# Patient Record
Sex: Female | Born: 1950 | Race: White | Hispanic: No | Marital: Single | Smoking: Current every day smoker
Health system: Southern US, Community
[De-identification: ages and names within clinical notes are randomized; demographics above are authoritative.]

## PROBLEM LIST (undated history)

## (undated) DIAGNOSIS — R053 Chronic cough: Secondary | ICD-10-CM

## (undated) DIAGNOSIS — E0590 Thyrotoxicosis, unspecified without thyrotoxic crisis or storm: Secondary | ICD-10-CM

## (undated) DIAGNOSIS — R05 Cough: Secondary | ICD-10-CM

## (undated) DIAGNOSIS — I5189 Other ill-defined heart diseases: Secondary | ICD-10-CM

## (undated) DIAGNOSIS — J449 Chronic obstructive pulmonary disease, unspecified: Secondary | ICD-10-CM

## (undated) DIAGNOSIS — I700 Atherosclerosis of aorta: Secondary | ICD-10-CM

## (undated) DIAGNOSIS — R9431 Abnormal electrocardiogram [ECG] [EKG]: Secondary | ICD-10-CM

## (undated) DIAGNOSIS — M5136 Other intervertebral disc degeneration, lumbar region: Secondary | ICD-10-CM

## (undated) DIAGNOSIS — I739 Peripheral vascular disease, unspecified: Secondary | ICD-10-CM

## (undated) DIAGNOSIS — I70213 Atherosclerosis of native arteries of extremities with intermittent claudication, bilateral legs: Secondary | ICD-10-CM

## (undated) DIAGNOSIS — M51369 Other intervertebral disc degeneration, lumbar region without mention of lumbar back pain or lower extremity pain: Secondary | ICD-10-CM

## (undated) HISTORY — PX: OTHER SURGICAL HISTORY: SHX169

## (undated) HISTORY — DX: Thyrotoxicosis, unspecified without thyrotoxic crisis or storm: E05.90

---

## 2011-05-29 HISTORY — PX: BACK SURGERY: SHX140

## 2011-06-18 ENCOUNTER — Other Ambulatory Visit (HOSPITAL_COMMUNITY): Payer: Commercial Indemnity

## 2011-06-19 ENCOUNTER — Other Ambulatory Visit: Payer: Self-pay | Admitting: Specialist

## 2011-06-19 ENCOUNTER — Other Ambulatory Visit (HOSPITAL_COMMUNITY): Payer: Self-pay | Admitting: Specialist

## 2011-06-19 ENCOUNTER — Ambulatory Visit (HOSPITAL_COMMUNITY)
Admission: RE | Admit: 2011-06-19 | Discharge: 2011-06-19 | Disposition: A | Discharge status: Home / Self Care | Payer: Commercial Indemnity | Source: Ambulatory Visit | Attending: Specialist | Admitting: Specialist

## 2011-06-19 ENCOUNTER — Encounter (HOSPITAL_COMMUNITY): Payer: Commercial Indemnity

## 2011-06-19 DIAGNOSIS — Z01818 Encounter for other preprocedural examination: Secondary | ICD-10-CM

## 2011-06-19 DIAGNOSIS — I700 Atherosclerosis of aorta: Secondary | ICD-10-CM | POA: Insufficient documentation

## 2011-06-19 DIAGNOSIS — Z01812 Encounter for preprocedural laboratory examination: Secondary | ICD-10-CM | POA: Insufficient documentation

## 2011-06-19 DIAGNOSIS — R9431 Abnormal electrocardiogram [ECG] [EKG]: Secondary | ICD-10-CM | POA: Insufficient documentation

## 2011-06-19 DIAGNOSIS — Z01810 Encounter for preprocedural cardiovascular examination: Secondary | ICD-10-CM | POA: Insufficient documentation

## 2011-06-19 DIAGNOSIS — Z01811 Encounter for preprocedural respiratory examination: Secondary | ICD-10-CM | POA: Insufficient documentation

## 2011-06-19 DIAGNOSIS — IMO0002 Reserved for concepts with insufficient information to code with codable children: Secondary | ICD-10-CM | POA: Insufficient documentation

## 2011-06-19 LAB — CBC
HCT: 47.9 % — ABNORMAL HIGH (ref 36.0–46.0)
Hemoglobin: 16.7 g/dL — ABNORMAL HIGH (ref 12.0–15.0)
MCH: 32.1 pg (ref 26.0–34.0)
MCV: 92.1 fL (ref 78.0–100.0)
RBC: 5.2 MIL/uL — ABNORMAL HIGH (ref 3.87–5.11)

## 2011-06-19 LAB — COMPREHENSIVE METABOLIC PANEL
ALT: 12 U/L (ref 0–35)
AST: 17 U/L (ref 0–37)
Albumin: 3.9 g/dL (ref 3.5–5.2)
CO2: 30 mEq/L (ref 19–32)
Calcium: 10.2 mg/dL (ref 8.4–10.5)
GFR calc non Af Amer: >60 mL/min (ref >60)
Sodium: 137 mEq/L (ref 135–145)
Total Protein: 7.7 g/dL (ref 6.0–8.3)

## 2011-06-19 LAB — URINE MICROSCOPIC-ADD ON

## 2011-06-19 LAB — PROTIME-INR: Prothrombin Time: 12.8 seconds (ref 11.6–15.2)

## 2011-06-19 LAB — URINALYSIS, ROUTINE W REFLEX MICROSCOPIC
Glucose, UA: NEGATIVE mg/dL
Protein, ur: NEGATIVE mg/dL
Specific Gravity, Urine: 1.009 (ref 1.005–1.030)
pH: 6 (ref 5.0–8.0)

## 2011-06-26 ENCOUNTER — Ambulatory Visit (HOSPITAL_COMMUNITY): Payer: Managed Care, Other (non HMO)

## 2011-06-26 ENCOUNTER — Observation Stay (HOSPITAL_COMMUNITY)
Admission: RE | Admit: 2011-06-26 | Discharge: 2011-06-27 | Disposition: A | Discharge status: Home / Self Care | Payer: Managed Care, Other (non HMO) | Source: Ambulatory Visit | Attending: Specialist | Admitting: Specialist

## 2011-06-26 ENCOUNTER — Other Ambulatory Visit: Payer: Self-pay | Admitting: Specialist

## 2011-06-26 DIAGNOSIS — F17200 Nicotine dependence, unspecified, uncomplicated: Secondary | ICD-10-CM | POA: Insufficient documentation

## 2011-06-26 DIAGNOSIS — M5126 Other intervertebral disc displacement, lumbar region: Secondary | ICD-10-CM | POA: Insufficient documentation

## 2011-06-26 DIAGNOSIS — M5137 Other intervertebral disc degeneration, lumbosacral region: Secondary | ICD-10-CM | POA: Insufficient documentation

## 2011-06-26 DIAGNOSIS — M51379 Other intervertebral disc degeneration, lumbosacral region without mention of lumbar back pain or lower extremity pain: Secondary | ICD-10-CM | POA: Insufficient documentation

## 2011-06-26 DIAGNOSIS — M48061 Spinal stenosis, lumbar region without neurogenic claudication: Principal | ICD-10-CM | POA: Insufficient documentation

## 2011-06-26 LAB — URINE MICROSCOPIC-ADD ON

## 2011-06-26 LAB — URINALYSIS, ROUTINE W REFLEX MICROSCOPIC
Glucose, UA: NEGATIVE mg/dL
Specific Gravity, Urine: 1.011 (ref 1.005–1.030)
pH: 5.5 (ref 5.0–8.0)

## 2011-06-27 LAB — URINE CULTURE: Culture  Setup Time: 201208290912

## 2011-06-28 NOTE — Op Note (Signed)
NAMEMARLISE, Pamela [name]:  [id number]  MEDICAL RECORD NO.:  [id number]  LOCATION:  1524                         FACILITY:  [facility]  PHYSICIAN:  Jene Every, M.D.    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  POSTOPERATIVE DIAGNOSIS:  Spinal stenosis bilaterally, herniated nucleus pulposus at L4-L5 right.  PROCEDURE PERFORMED:  Bilateral hemilaminotomy and lateral recess decompression at L4-L5, foraminotomies at L4 and L5 with central removal of the  ligamentum flavum, and microdiskectomy L4-L5 right.  ANESTHESIA:  General.  ASSISTANT:  Roma Schanz, PA  INDICATIONS FOR PROCEDURE:  This is a 60 year old with bilateral lower extremity radicular pain secondary to lateral recess stenosis, disk degeneration at L4-L5 bilaterally, disk herniation L4-L5 to the right into the foraminal region.  She was indicated for decompression due to the persistent pain, weakness, positive neurotension signs, MRI indicating lateral recess stenosis, fairly severe, left and right.  She had been refractory to conservative treatment including therapy, injections, activity modification.  Risks and benefits discussed, including bleeding, infection, damage to neurovascular structures, CSF leakage, __________ fibrosis, adjacent segment disease, need for fusion in the future, DVT, PE, and anesthetic complications, etc.  TECHNIQUE:  The patient in supine position, after induction of adequate anesthesia and 1 g of Kefzol, she was placed prone on the [place] frame. All bony prominences were well-padded.  Lumbar region was prepped and draped in the usual sterile fashion.  An 18 gauge spinal needle was utilized to localize L4-L5 interspace, confirmed with x-ray.  Incision was made from spinous process of  L4 to L5.  Subcutaneous tissue was dissected.  Electrocautery utilized to achieve hemostasis.  Dorsolumbar fascia identified by the line of the skin incision.  Paraspinous muscle elevated from the lamina of L4 and L5.  Cobb retractor was placed. Operating microscope draped and brought on to the surgical field.  We confirmed the space with Penfield 4 and a Kocher on the spinous process of L4.  She had a very limited interlaminar window bilaterally.  We felt that with the foraminal disk herniation extending out into the right, we gain access to the lateral recess.  It would be prudent to preserve as much of the facet as possible to perform this bilaterally with a central removal of ligamentum and the interspinous ligament.  I removed a very small portion of the spinous process of L4 and L5 and interspinous ligament.  We used the operating microscope, draped and brought on to the surgical field.  First, we utilized the attached ligamentum flavum from the cephalad edge of L5 utilizing straight curette and caudad edge of L4 bilaterally using the 2 mm Kerrison, followed by 3 mm Kerrison. We preserved the pars amply bilaterally.  Centrally we then entered the canal.  We placed neural patties just beneath the ligamentum flavum and used a hockey stick probe to free adhesions noted cephalad and caudad, and medial and lateral.  We then meticulously removed the ligamentum flavum bilaterally.  There was hypertrophic ligamentum, severe left and right, central  and to the left.  Decompressed the lateral recesses to the medial border of the pedicle.  Severe stenosis noted bilaterally, right greater than left.  We identified the foramen of L4, a disk herniation.  After we checked the thecal sac and identified the L5 root, performed the foraminotomy of L5.  We identified the disk herniation migrating cephalad into the foramen of L5 and out laterally.  Bipolar cautery was utilized to achieve hemostasis in the  epidural venous plexus.  A small annulotomy, after confirming the foramen of L4, was packed with a neural hook and removed disk herniation, sub ligamentous, with a micro pituitary, further mobilizing with a nerve hook out laterally and __________, and a hockey stick.  Following this, a hockey stick probe passed freely up the foramen of L4 and L5.  We have released 1 cm excursion of L5 root medial pedicle without difficulty.  I removed the neural patties. Hockey stick probe passed freely up to foramen of L5.  There was initially an attenuated area of the thecal sac that was dorsal into the left.  No evidence of CSF leakage.  We will it prudent to place a small section of Durafoam over the area and reconstituted with saline, smooth side down.  Following that performed the Valsalva, no evidence CSF leakage.  We felt we addressed the pathology and satisfactorily decompressed the L4 and L5 bilaterally.  Cobb retractor was removed.  Paraspinous muscle was inspected, with no evidence of active bleeding.  Copiously irrigated the subcutaneous tissue and repaired the dorsolumbar fascia with #1 Vicryl interrupted figure-of-eight sutures, subcu with only 2-0 Vicryl simple sutures. Skin was reapproximated with 4-0 subcuticular Prolene.  Wound was reinforced with Steri-Strips, sterile dressings applied.  She was placed supine on hospital bed, extubated without difficulty, and  transported to the recovery room in satisfactory condition.  The patient tolerated the procedure well.  No complications.  Minimal blood loss.     Jene Every, M.D.     Cordelia Pen  D:  06/26/2011  T:  06/26/2011  Job:  [id number]  Electronically Signed by Jene Every M.D. on 06/28/2011 02:41:55 PM

## 2011-07-19 NOTE — Discharge Summary (Signed)
  NAMECARLOTA, [name]:  [id number]  MEDICAL RECORD NO.:  [id number]  LOCATION:  1524                         FACILITY:  [facility]  PHYSICIAN:  Jene Every, M.D.    DATE OF BIRTH:  10-21-1951  DATE OF ADMISSION:  06/26/2011 DATE OF DISCHARGE:  06/27/2011                              DISCHARGE SUMMARY   ADMISSION DIAGNOSES:  Spinal stenosis, herniated nucleus pulposus, L4- L5.  DISCHARGE DIAGNOSIS:  Spinal stenosis, herniated nucleus pulposus, L4- L5.  Status post bilateral hemilaminotomy and lateral recess decompression L4-L5 with microdiskectomy at L4-L5 on the right.  PROCEDURE:  The patient was taken to the OR and underwent the above stated procedure.  SURGEON:  Jene Every, M.D.  ASSISTANT:  Roma Schanz, P.A.  CONSULT:  PT/OT.  HOSPITAL COURSE:  Uneventful.  On postop day #1, it was felt patient was stable to be discharged to home with any home health needs met.  She is to follow up with Dr. Shelle Iron in approximately 10 to 14 days for suture removal.  ACTIVITY:  She is to ambulate as tolerated, utilizing back precautions.  MEDICATIONS:  As per med rec sheet.  DIET:  As tolerated.  CONDITION ON DISCHARGE:  Stable.  FINAL DIAGNOSIS:  Doing well, status post lumbar decompression, microdiskectomy at L4-L5.     Roma Schanz, P.A.   ______________________________ Jene Every, M.D.   CS/MEDQ  D:  07/12/2011  T:  07/12/2011  Job:  [id number]  Electronically Signed by Roma Schanz P.A. on 07/16/2011 12:04:14 PM Electronically Signed by Jene Every M.D. on 07/19/2011 03:10:37 PM

## 2012-04-15 IMAGING — CR DG CHEST 2V
2 series · 2 of 2 positions shown · non-contrast
Comparison: None

CLINICAL DATA: Preop radiograph.  Herniated disc disease.

CHEST - 2 VIEW

[w chest pa]
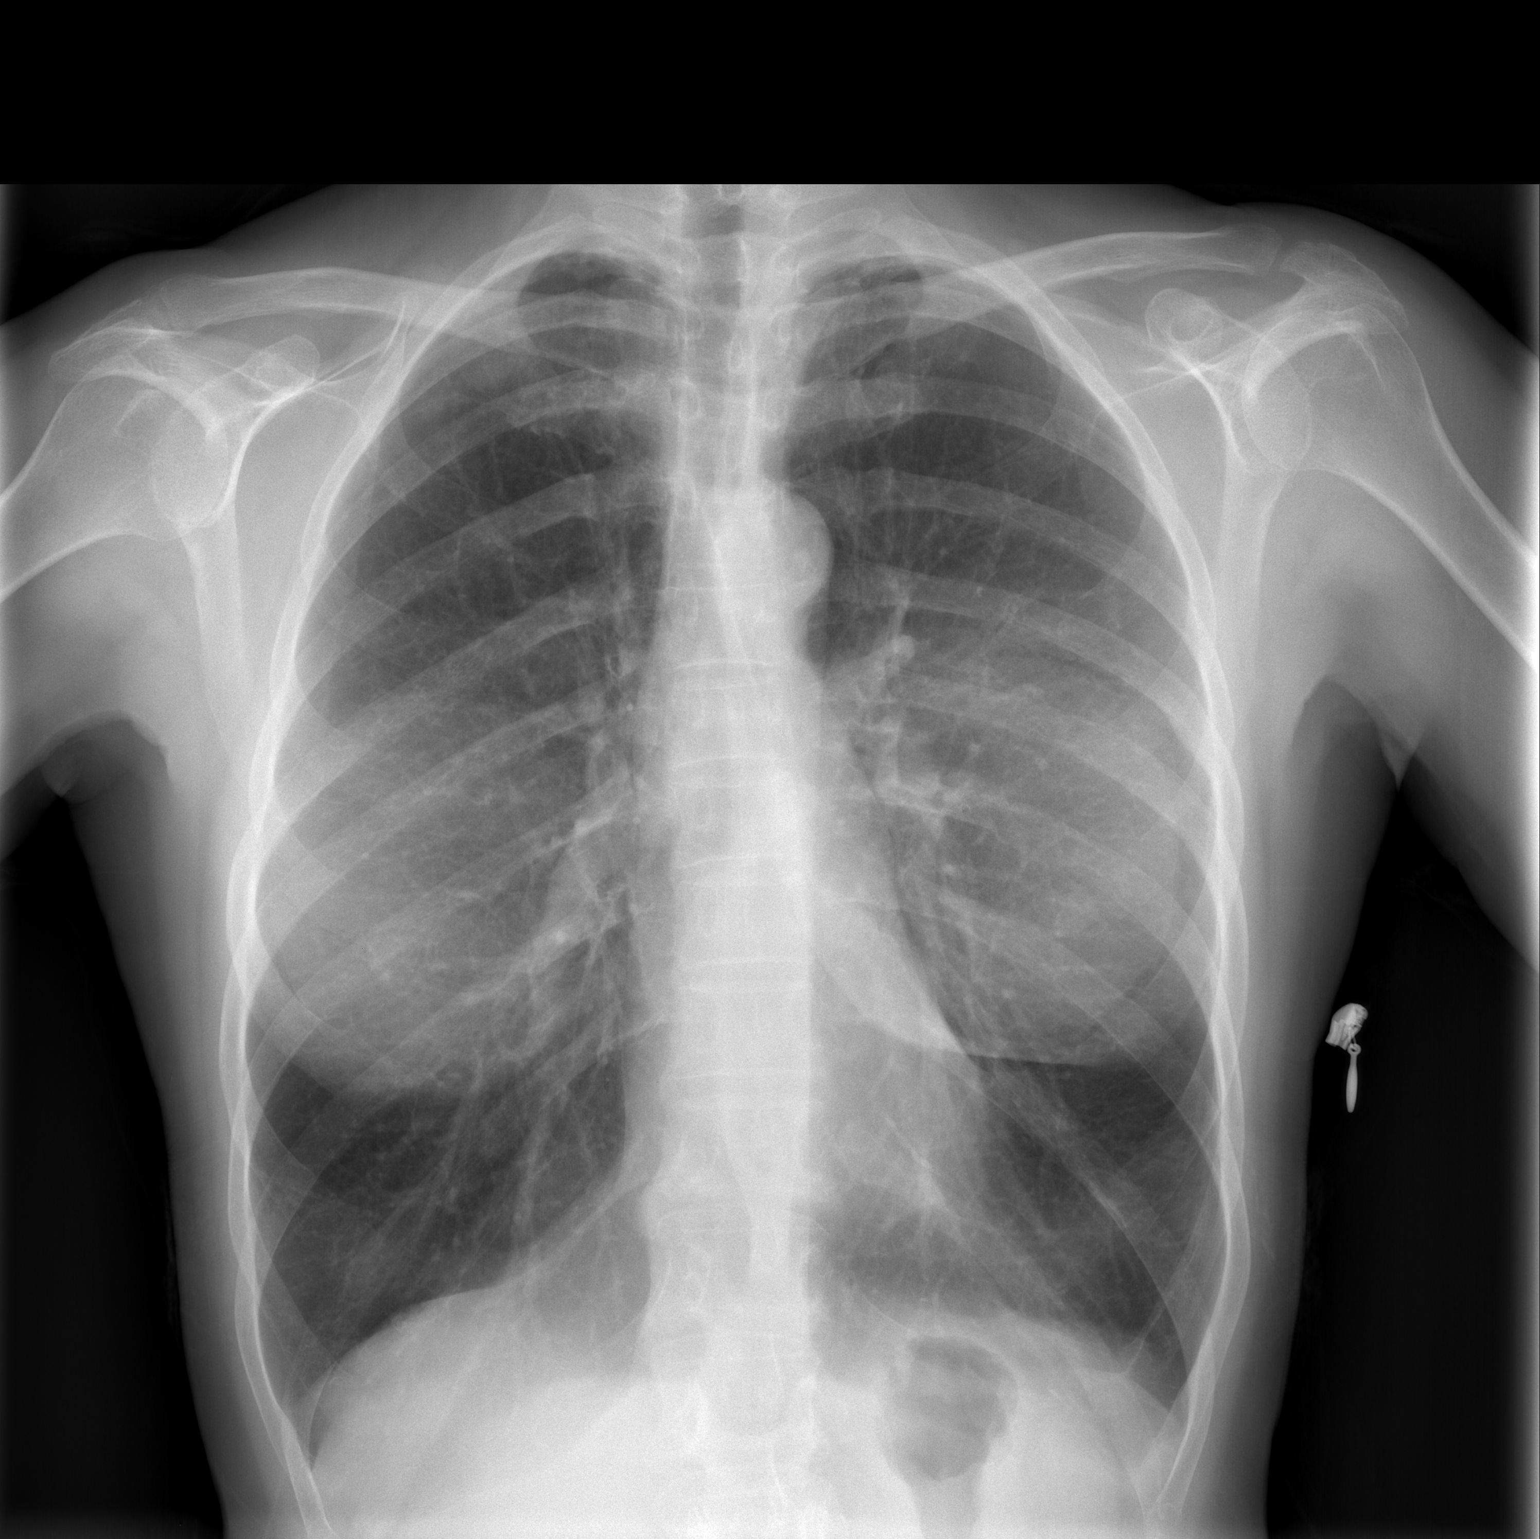

[w chest lat]
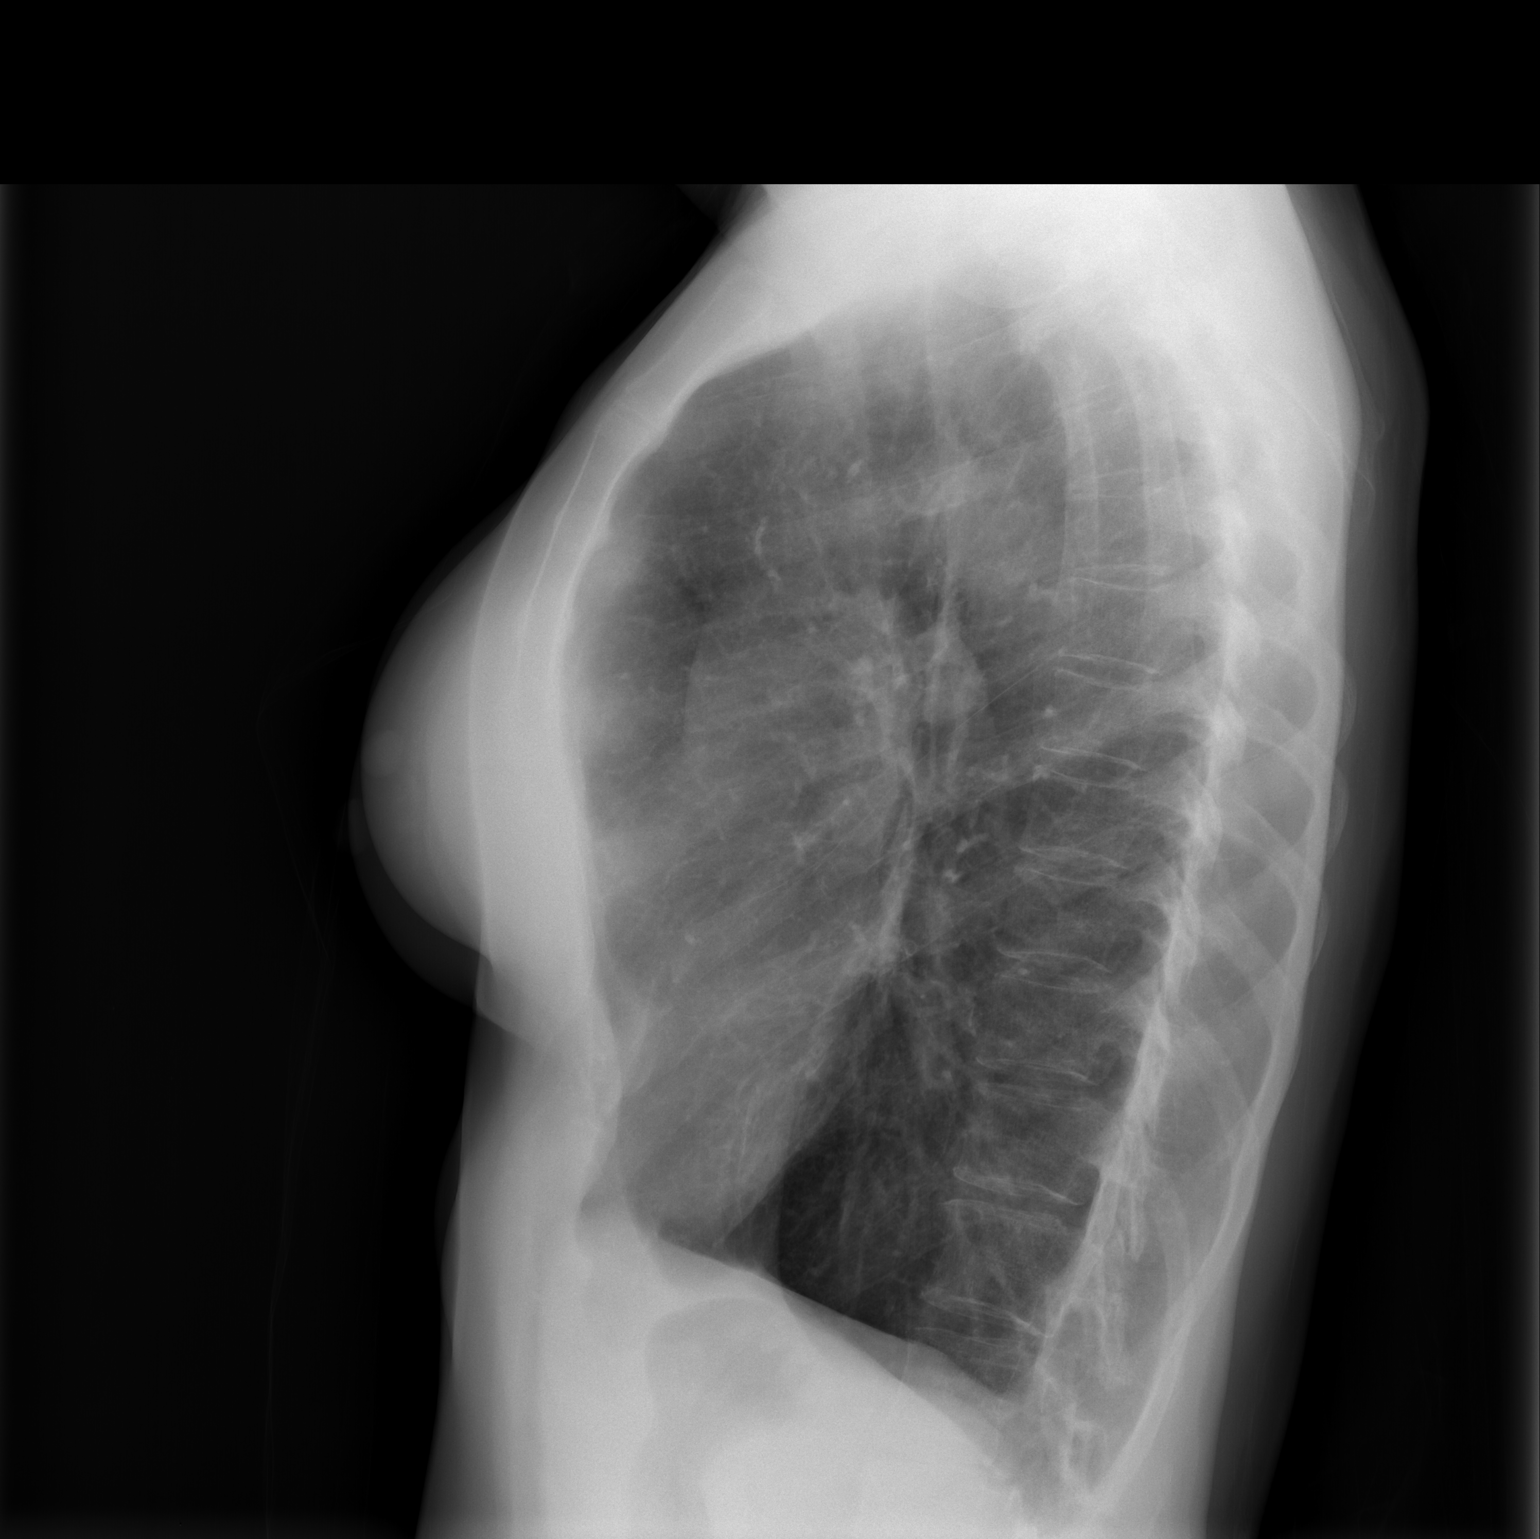

[2 of 2 positions shown; findings below may reference images not displayed]

FINDINGS: The heart size is normal.

Lungs are hyperinflated but clear.

No airspace consolidation identified.

Review of the visualized osseous structures is unremarkable.
IMPRESSION: 1.  Lungs are hyperinflated but clear.  No acute cardiopulmonary
abnormalities noted.

## 2012-04-15 IMAGING — CR DG LUMBAR SPINE 2-3V
3 series · 3 of 3 positions shown · non-contrast
Comparison: None.

CLINICAL DATA: 59-year-old female preoperative study for herniated
disc, spinal stenosis at L4-L5.  Pain radiating down both legs.

LUMBAR SPINE - 2-3 VIEW

[t l-spine a.p.]
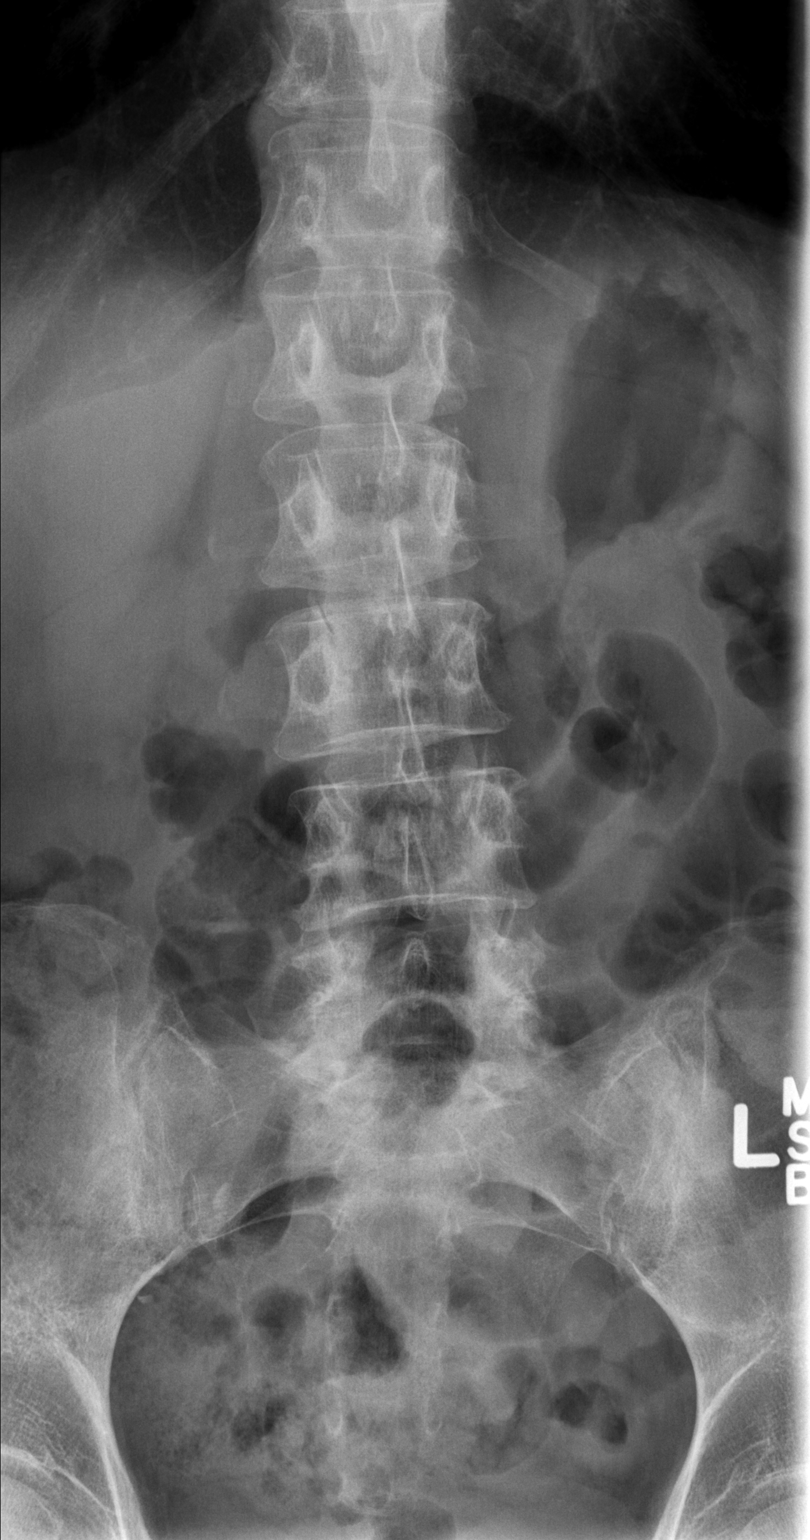

[t l-spine lat]
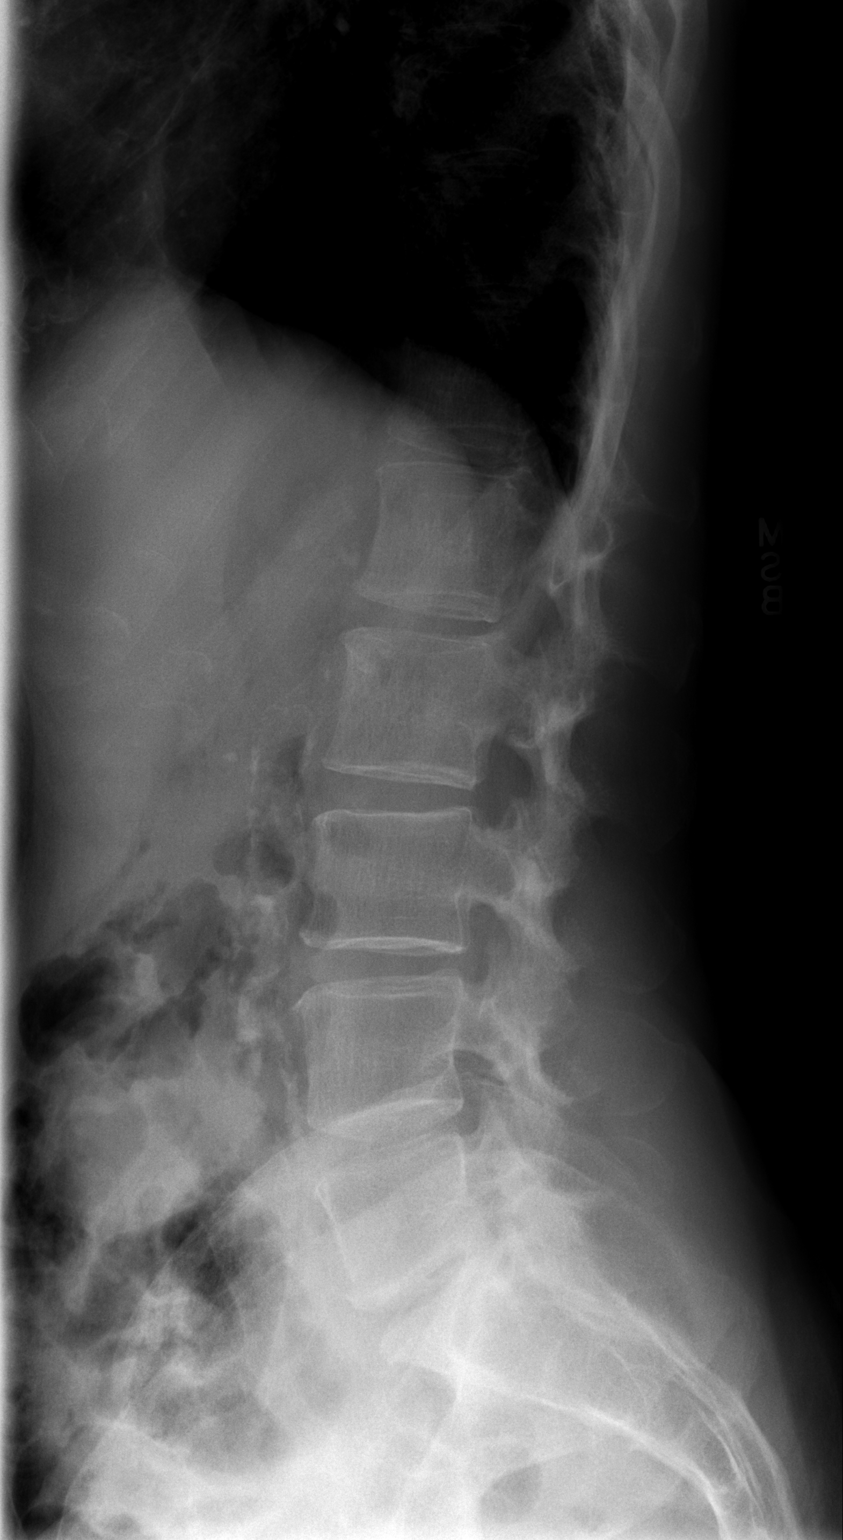

[t l-spine l5-s1 spot]
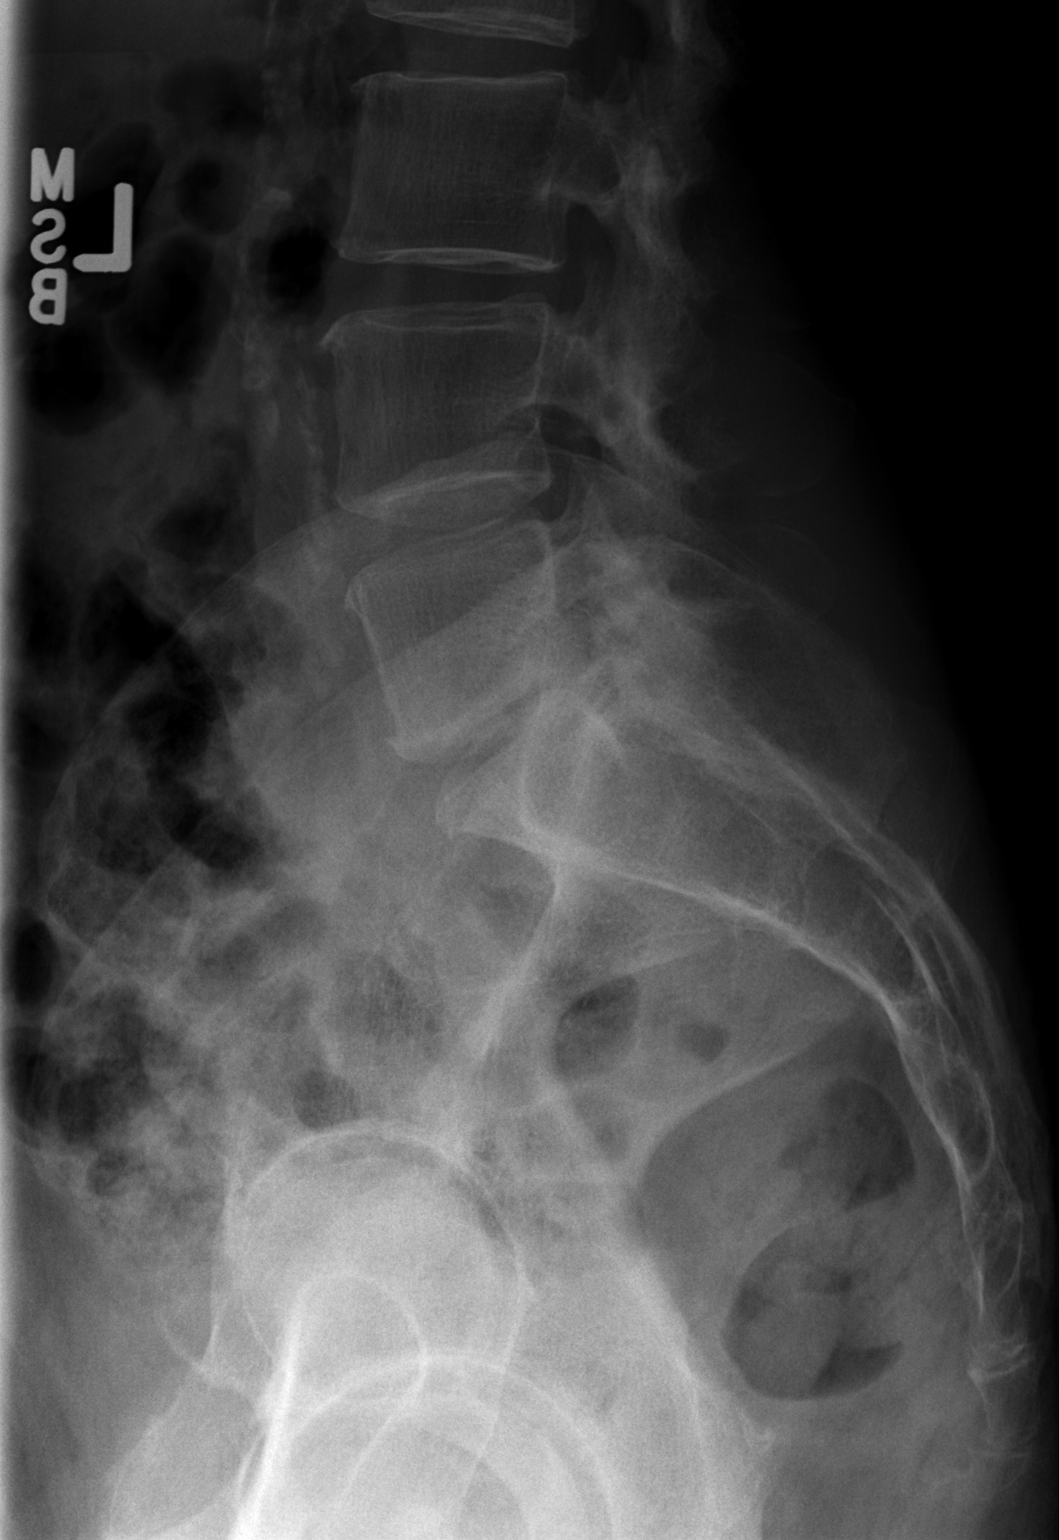

[3 of 3 positions shown; findings below may reference images not displayed]

FINDINGS: Hypoplastic twelfth ribs but otherwise normal lumbar
segmentation.  Disc space narrowing is most pronounced at L5-L1.
Normal vertebral height and alignment. Bone mineralization is
within normal limits.  Calcified atherosclerosis of the aorta.
IMPRESSION: Normal lumbar segmentation. No acute osseous abnormality
identified.  The lumbar levels on these images were numbered in
anticipation of surgery.

## 2012-04-28 ENCOUNTER — Other Ambulatory Visit: Payer: Self-pay | Admitting: Otolaryngology

## 2012-05-14 ENCOUNTER — Encounter (HOSPITAL_COMMUNITY): Payer: Self-pay | Admitting: Pharmacy Technician

## 2012-05-15 ENCOUNTER — Ambulatory Visit (HOSPITAL_COMMUNITY)
Admission: RE | Admit: 2012-05-15 | Discharge: 2012-05-15 | Disposition: A | Discharge status: Home / Self Care | Payer: Managed Care, Other (non HMO) | Source: Ambulatory Visit | Attending: Specialist | Admitting: Specialist

## 2012-05-15 ENCOUNTER — Encounter (HOSPITAL_COMMUNITY)
Admission: RE | Admit: 2012-05-15 | Discharge: 2012-05-15 | Disposition: A | Discharge status: Home / Self Care | Payer: Managed Care, Other (non HMO) | Source: Ambulatory Visit | Attending: Specialist | Admitting: Specialist

## 2012-05-15 ENCOUNTER — Encounter (HOSPITAL_COMMUNITY): Payer: Self-pay

## 2012-05-15 DIAGNOSIS — R05 Cough: Secondary | ICD-10-CM | POA: Insufficient documentation

## 2012-05-15 DIAGNOSIS — Z01812 Encounter for preprocedural laboratory examination: Secondary | ICD-10-CM | POA: Insufficient documentation

## 2012-05-15 DIAGNOSIS — M7130 Other bursal cyst, unspecified site: Secondary | ICD-10-CM | POA: Insufficient documentation

## 2012-05-15 DIAGNOSIS — I700 Atherosclerosis of aorta: Secondary | ICD-10-CM | POA: Insufficient documentation

## 2012-05-15 DIAGNOSIS — R059 Cough, unspecified: Secondary | ICD-10-CM | POA: Insufficient documentation

## 2012-05-15 HISTORY — DX: Chronic cough: R05.3

## 2012-05-15 HISTORY — DX: Abnormal electrocardiogram (ECG) (EKG): R94.31

## 2012-05-15 HISTORY — DX: Cough: R05

## 2012-05-15 HISTORY — DX: Other intervertebral disc degeneration, lumbar region without mention of lumbar back pain or lower extremity pain: M51.369

## 2012-05-15 HISTORY — DX: Other intervertebral disc degeneration, lumbar region: M51.36

## 2012-05-15 LAB — COMPREHENSIVE METABOLIC PANEL
ALT: 10 U/L (ref 0–35)
AST: 15 U/L (ref 0–37)
Alkaline Phosphatase: 94 U/L (ref 39–117)
CO2: 27 mEq/L (ref 19–32)
Chloride: 100 mEq/L (ref 96–112)
Creatinine, Ser: 0.66 mg/dL (ref 0.50–1.10)
GFR calc non Af Amer: >90 mL/min (ref >90)
Potassium: 3.9 mEq/L (ref 3.5–5.1)
Total Bilirubin: 0.2 mg/dL — ABNORMAL LOW (ref 0.3–1.2)

## 2012-05-15 LAB — URINALYSIS, ROUTINE W REFLEX MICROSCOPIC
Bilirubin Urine: NEGATIVE
Glucose, UA: NEGATIVE mg/dL
Hgb urine dipstick: NEGATIVE
Ketones, ur: NEGATIVE mg/dL
Protein, ur: NEGATIVE mg/dL
Urobilinogen, UA: 0.2 mg/dL (ref 0.0–1.0)

## 2012-05-15 LAB — DIFFERENTIAL
Basophils Relative: 1 % (ref 0–1)
Eosinophils Absolute: 0.3 10*3/uL (ref 0.0–0.7)
Lymphs Abs: 4 10*3/uL (ref 0.7–4.0)
Neutrophils Relative %: 46 % (ref 43–77)

## 2012-05-15 LAB — CBC
MCH: 31.4 pg (ref 26.0–34.0)
MCHC: 34.7 g/dL (ref 30.0–36.0)
Platelets: 235 10*3/uL (ref 150–400)
RBC: 5.13 MIL/uL — ABNORMAL HIGH (ref 3.87–5.11)

## 2012-05-15 LAB — SURGICAL PCR SCREEN: MRSA, PCR: NEGATIVE

## 2012-05-15 NOTE — Patient Instructions (Signed)
YOUR SURGERY IS SCHEDULED ON:  Thursday  7/25  AT 12:30 PM  REPORT TO [facility] AT:  10:30 AM      PHONE # FOR SHORT STAY IS [phone]  DO NOT EAT OR DRINK ANYTHING AFTER MIDNIGHT THE NIGHT BEFORE YOUR SURGERY.  YOU MAY BRUSH YOUR TEETH, RINSE OUT YOUR MOUTH--BUT NO WATER, NO FOOD, NO CHEWING GUM, NO MINTS, NO CANDIES, NO CHEWING TOBACCO.  PLEASE TAKE THE FOLLOWING MEDICATIONS THE AM OF YOUR SURGERY WITH A FEW SIPS OF WATER:  NO MEDS TO TAKE THE DAY OF SURGERY - UNLESS YOU NEED TO TAKE YOUR PAIN MEDICATION.    IF YOU USE INHALERS--USE YOUR INHALERS THE AM OF YOUR SURGERY AND BRING INHALERS TO THE HOSPITAL -TAKE TO SURGERY.    IF YOU ARE DIABETIC:  DO NOT TAKE ANY DIABETIC MEDICATIONS THE AM OF YOUR SURGERY.  IF YOU TAKE INSULIN IN THE EVENINGS--PLEASE ONLY TAKE 1/2 NORMAL EVENING DOSE THE NIGHT BEFORE YOUR SURGERY.  NO INSULIN THE AM OF YOUR SURGERY.  IF YOU HAVE SLEEP APNEA AND USE CPAP OR BIPAP--PLEASE BRING THE MASK --NOT THE MACHINE-NOT THE TUBING   -JUST THE MASK. DO NOT BRING VALUABLES, MONEY, CREDIT CARDS.  CONTACT LENS, DENTURES / PARTIALS, GLASSES SHOULD NOT BE WORN TO SURGERY AND IN MOST CASES-HEARING AIDS WILL NEED TO BE REMOVED.  BRING YOUR GLASSES CASE, ANY EQUIPMENT NEEDED FOR YOUR CONTACT LENS. FOR PATIENTS ADMITTED TO THE HOSPITAL--CHECK OUT TIME THE DAY OF DISCHARGE IS 11:00 AM.  ALL INPATIENT ROOMS ARE PRIVATE - WITH BATHROOM, TELEPHONE, TELEVISION AND WIFI INTERNET. IF YOU ARE BEING DISCHARGED THE SAME DAY OF YOUR SURGERY--YOU CAN NOT DRIVE YOURSELF HOME--AND SHOULD NOT GO HOME ALONE BY TAXI OR BUS.  NO DRIVING OR OPERATING MACHINERY FOR 24 HOURS FOLLOWING ANESTHESIA / PAIN MEDICATIONS.                            SPECIAL INSTRUCTIONS:  CHLORHEXIDINE SOAP SHOWER (other brand names are Betasept and Hibiclens ) PLEASE SHOWER WITH CHLORHEXIDINE THE NIGHT BEFORE YOUR SURGERY AND THE AM OF YOUR SURGERY. DO NOT USE CHLORHEXIDINE ON YOUR FACE OR PRIVATE  AREAS--YOU MAY USE YOUR NORMAL SOAP THOSE AREAS AND YOUR NORMAL SHAMPOO.  WOMEN SHOULD AVOID SHAVING UNDER ARMS AND SHAVING LEGS 48 HOURS BEFORE USING CHLORHEXIDINE TO AVOID SKIN IRRITATION.  DO NOT USE IF ALLERGIC TO CHLORHEXIDINE.  PLEASE READ OVER ANY  FACT SHEETS THAT YOU WERE GIVEN: MRSA INFORMATION, INCENTIVE SPIROMETER INFORMATION.

## 2012-05-15 NOTE — Pre-Procedure Instructions (Signed)
CBC, DIFF, CMET, UA, CXR, LUMBAR SPINE XRAY WERE DONE TODAY - PREOP - AT [facility]. PT HAS EKG REPORT FROM 06/19/11 -FROM [facility]-IT WAS USED PREOP LUMBAR SURGERY AND IS WITH IN A YEAR AND OK TO USE FOR THIS SURGERY. PT DID NOT SIGN OR CONSENT TODAY-SHE WANTS TO CALL DR. BEANE ABOUT HER PROCEDURE-THEN WILL SIGN CONSENT DAY OF SURGERY. PREOP INSTRUCTIONS DISCUSSED WITH PT USING THE TEACH BACK METHOD.

## 2012-05-21 ENCOUNTER — Ambulatory Visit (HOSPITAL_COMMUNITY): Payer: Managed Care, Other (non HMO)

## 2012-05-21 ENCOUNTER — Encounter (HOSPITAL_COMMUNITY): Payer: Self-pay | Admitting: Anesthesiology

## 2012-05-21 ENCOUNTER — Encounter (HOSPITAL_COMMUNITY): Payer: Self-pay | Admitting: *Deleted

## 2012-05-21 ENCOUNTER — Other Ambulatory Visit: Payer: Self-pay

## 2012-05-21 ENCOUNTER — Ambulatory Visit (HOSPITAL_COMMUNITY): Payer: Managed Care, Other (non HMO) | Admitting: Anesthesiology

## 2012-05-21 ENCOUNTER — Encounter (HOSPITAL_COMMUNITY)
Admission: RE | Disposition: A | Discharge status: Home / Self Care | Payer: Self-pay | Source: Ambulatory Visit | Attending: Specialist

## 2012-05-21 ENCOUNTER — Observation Stay (HOSPITAL_COMMUNITY)
Admission: RE | Admit: 2012-05-21 | Discharge: 2012-05-22 | Disposition: A | Discharge status: Home / Self Care | Payer: Managed Care, Other (non HMO) | Source: Ambulatory Visit | Attending: Specialist | Admitting: Specialist

## 2012-05-21 DIAGNOSIS — M549 Dorsalgia, unspecified: Secondary | ICD-10-CM

## 2012-05-21 DIAGNOSIS — M7138 Other bursal cyst, other site: Secondary | ICD-10-CM | POA: Diagnosis present

## 2012-05-21 DIAGNOSIS — R9431 Abnormal electrocardiogram [ECG] [EKG]: Secondary | ICD-10-CM | POA: Insufficient documentation

## 2012-05-21 DIAGNOSIS — M7130 Other bursal cyst, unspecified site: Secondary | ICD-10-CM | POA: Insufficient documentation

## 2012-05-21 DIAGNOSIS — R05 Cough: Secondary | ICD-10-CM | POA: Insufficient documentation

## 2012-05-21 DIAGNOSIS — F17200 Nicotine dependence, unspecified, uncomplicated: Secondary | ICD-10-CM | POA: Insufficient documentation

## 2012-05-21 DIAGNOSIS — M5126 Other intervertebral disc displacement, lumbar region: Secondary | ICD-10-CM | POA: Diagnosis present

## 2012-05-21 DIAGNOSIS — M48061 Spinal stenosis, lumbar region without neurogenic claudication: Principal | ICD-10-CM | POA: Insufficient documentation

## 2012-05-21 DIAGNOSIS — R059 Cough, unspecified: Secondary | ICD-10-CM | POA: Insufficient documentation

## 2012-05-21 SURGERY — DECOMPRESSIVE LUMBAR LAMINECTOMY LEVEL 1
Anesthesia: General | Site: Back | Laterality: Right | Wound class: Clean

## 2012-05-21 MED ORDER — SODIUM CHLORIDE 0.45 % IV SOLN
INTRAVENOUS | Status: DC
  Administered 2012-05-21: 14:00:00 via INTRAVENOUS

## 2012-05-21 MED ORDER — LACTATED RINGERS IV SOLN: INTRAVENOUS | Status: DC

## 2012-05-21 MED ORDER — ROCURONIUM BROMIDE 100 MG/10ML IV SOLN
INTRAVENOUS | Status: DC | PRN
  Administered 2012-05-21: 40 mg via INTRAVENOUS

## 2012-05-21 MED ORDER — LIDOCAINE HCL (CARDIAC) 20 MG/ML IV SOLN
INTRAVENOUS | Status: DC | PRN
  Administered 2012-05-21: 100 mg via INTRAVENOUS

## 2012-05-21 MED ORDER — ONDANSETRON HCL 4 MG/2ML IJ SOLN: 4.0000 mg | INTRAMUSCULAR | Status: DC | PRN

## 2012-05-21 MED ORDER — SODIUM CHLORIDE 0.9 % IR SOLN
Status: DC | PRN
  Administered 2012-05-21: 11:00:00

## 2012-05-21 MED ORDER — FENTANYL CITRATE 0.05 MG/ML IJ SOLN
INTRAMUSCULAR | Status: DC | PRN
  Administered 2012-05-21 (×4): 50 ug via INTRAVENOUS

## 2012-05-21 MED ORDER — THROMBIN 5000 UNITS EX SOLR
CUTANEOUS | Status: DC | PRN
  Administered 2012-05-21 (×2): 5000 [IU] via TOPICAL

## 2012-05-21 MED ORDER — SODIUM CHLORIDE 0.9 % IJ SOLN: 3.0000 mL | Freq: Two times a day (BID) | INTRAMUSCULAR | Status: DC

## 2012-05-21 MED ORDER — PROPOFOL 10 MG/ML IV BOLUS
INTRAVENOUS | Status: DC | PRN
  Administered 2012-05-21: 150 mg via INTRAVENOUS

## 2012-05-21 MED ORDER — PHENYLEPHRINE HCL 10 MG/ML IJ SOLN
INTRAMUSCULAR | Status: DC | PRN
  Administered 2012-05-21 (×2): 100 ug via INTRAVENOUS
  Administered 2012-05-21: 150 ug via INTRAVENOUS
  Administered 2012-05-21: 100 ug via INTRAVENOUS
  Administered 2012-05-21: 150 ug via INTRAVENOUS
  Administered 2012-05-21 (×2): 100 ug via INTRAVENOUS

## 2012-05-21 MED ORDER — CEFAZOLIN SODIUM 1-5 GM-% IV SOLN
INTRAVENOUS | Status: DC | PRN
  Administered 2012-05-21: 2 g via INTRAVENOUS

## 2012-05-21 MED ORDER — HYDROCODONE-ACETAMINOPHEN 5-325 MG PO TABS
1.0000 | ORAL_TABLET | ORAL | Status: DC | PRN
  Administered 2012-05-21: 1 via ORAL
  Filled 2012-05-21: qty 1

## 2012-05-21 MED ORDER — BISACODYL 10 MG RE SUPP: 10.0000 mg | Freq: Every day | RECTAL | Status: DC | PRN

## 2012-05-21 MED ORDER — PHENOL 1.4 % MT LIQD: 1.0000 | OROMUCOSAL | Status: DC | PRN

## 2012-05-21 MED ORDER — BUPIVACAINE-EPINEPHRINE 0.5% -1:200000 IJ SOLN
INTRAMUSCULAR | Status: DC | PRN
  Administered 2012-05-21: 12 mL

## 2012-05-21 MED ORDER — CEFAZOLIN SODIUM-DEXTROSE 2-3 GM-% IV SOLR: 2.0000 g | INTRAVENOUS | Status: DC

## 2012-05-21 MED ORDER — ACETAMINOPHEN 10 MG/ML IV SOLN
INTRAVENOUS | Status: DC | PRN
  Administered 2012-05-21: 1000 mg via INTRAVENOUS

## 2012-05-21 MED ORDER — SODIUM CHLORIDE 0.9 % IV SOLN
250.0000 mL | INTRAVENOUS | Status: DC
  Administered 2012-05-21: 250 mL via INTRAVENOUS

## 2012-05-21 MED ORDER — GLYCOPYRROLATE 0.2 MG/ML IJ SOLN
INTRAMUSCULAR | Status: DC | PRN
  Administered 2012-05-21: 0.4 mg via INTRAVENOUS

## 2012-05-21 MED ORDER — ACETAMINOPHEN 650 MG RE SUPP: 650.0000 mg | RECTAL | Status: DC | PRN

## 2012-05-21 MED ORDER — SENNOSIDES-DOCUSATE SODIUM 8.6-50 MG PO TABS
1.0000 | ORAL_TABLET | Freq: Every evening | ORAL | Status: DC | PRN
  Filled 2012-05-21: qty 1

## 2012-05-21 MED ORDER — EPHEDRINE SULFATE 50 MG/ML IJ SOLN
INTRAMUSCULAR | Status: DC | PRN
  Administered 2012-05-21: 10 mg via INTRAVENOUS
  Administered 2012-05-21 (×4): 5 mg via INTRAVENOUS

## 2012-05-21 MED ORDER — SODIUM CHLORIDE 0.9 % IJ SOLN: 3.0000 mL | INTRAMUSCULAR | Status: DC | PRN

## 2012-05-21 MED ORDER — NEOSTIGMINE METHYLSULFATE 1 MG/ML IJ SOLN
INTRAMUSCULAR | Status: DC | PRN
  Administered 2012-05-21: 3 mg via INTRAVENOUS

## 2012-05-21 MED ORDER — MEPERIDINE HCL 50 MG/ML IJ SOLN
6.2500 mg | INTRAMUSCULAR | Status: DC | PRN
Start: 2012-05-21 — End: 2012-05-21

## 2012-05-21 MED ORDER — CHLORHEXIDINE GLUCONATE 4 % EX LIQD
60.0000 mL | Freq: Once | CUTANEOUS | Status: DC
Start: 2012-05-21 — End: 2012-05-21
  Filled 2012-05-21: qty 60

## 2012-05-21 MED ORDER — ACETAMINOPHEN 325 MG PO TABS: 650.0000 mg | ORAL_TABLET | ORAL | Status: DC | PRN

## 2012-05-21 MED ORDER — MENTHOL 3 MG MT LOZG: 1.0000 | LOZENGE | OROMUCOSAL | Status: DC | PRN

## 2012-05-21 MED ORDER — CEFAZOLIN SODIUM-DEXTROSE 2-3 GM-% IV SOLR
INTRAVENOUS | Status: AC
  Filled 2012-05-21: qty 50

## 2012-05-21 MED ORDER — CEFAZOLIN SODIUM 1-5 GM-% IV SOLN
1.0000 g | Freq: Three times a day (TID) | INTRAVENOUS | Status: AC
  Administered 2012-05-21 – 2012-05-22 (×2): 1 g via INTRAVENOUS
  Filled 2012-05-21 (×4): qty 50

## 2012-05-21 MED ORDER — OXYCODONE-ACETAMINOPHEN 5-325 MG PO TABS
1.0000 | ORAL_TABLET | ORAL | Status: DC | PRN
  Administered 2012-05-21 – 2012-05-22 (×3): 1 via ORAL
  Filled 2012-05-21 (×3): qty 1

## 2012-05-21 MED ORDER — ACETAMINOPHEN 10 MG/ML IV SOLN
INTRAVENOUS | Status: AC
  Filled 2012-05-21: qty 100

## 2012-05-21 MED ORDER — HYDROMORPHONE HCL PF 1 MG/ML IJ SOLN: 0.2500 mg | INTRAMUSCULAR | Status: DC | PRN

## 2012-05-21 MED ORDER — PANTOPRAZOLE SODIUM 40 MG IV SOLR
40.0000 mg | Freq: Every day | INTRAVENOUS | Status: DC
  Administered 2012-05-21: 40 mg via INTRAVENOUS
  Filled 2012-05-21 (×2): qty 40

## 2012-05-21 MED ORDER — LACTATED RINGERS IV SOLN
INTRAVENOUS | Status: DC
  Administered 2012-05-21: 13:00:00 via INTRAVENOUS
  Administered 2012-05-21: 1000 mL via INTRAVENOUS
  Administered 2012-05-21: 12:00:00 via INTRAVENOUS

## 2012-05-21 MED ORDER — DOCUSATE SODIUM 100 MG PO CAPS
100.0000 mg | ORAL_CAPSULE | Freq: Two times a day (BID) | ORAL | Status: DC
  Administered 2012-05-21: 100 mg via ORAL

## 2012-05-21 MED ORDER — MIDAZOLAM HCL 5 MG/5ML IJ SOLN
INTRAMUSCULAR | Status: DC | PRN
  Administered 2012-05-21: 2 mg via INTRAVENOUS

## 2012-05-21 MED ORDER — PROMETHAZINE HCL 25 MG/ML IJ SOLN: 6.2500 mg | INTRAMUSCULAR | Status: DC | PRN

## 2012-05-21 MED ORDER — MORPHINE SULFATE 2 MG/ML IJ SOLN: 1.0000 mg | INTRAMUSCULAR | Status: DC | PRN

## 2012-05-21 MED ORDER — ONDANSETRON HCL 4 MG/2ML IJ SOLN
INTRAMUSCULAR | Status: DC | PRN
  Administered 2012-05-21: 4 mg via INTRAVENOUS

## 2012-05-21 SURGICAL SUPPLY — 46 items
BAG ZIPLOCK 12X15 (MISCELLANEOUS) IMPLANT
BENZOIN TINCTURE PRP APPL 2/3 (GAUZE/BANDAGES/DRESSINGS) ×2 IMPLANT
CHLORAPREP W/TINT 26ML (MISCELLANEOUS) IMPLANT
CLEANER TIP ELECTROSURG 2X2 (MISCELLANEOUS) ×2 IMPLANT
CLOTH BEACON ORANGE TIMEOUT ST (SAFETY) ×2 IMPLANT
CLSR STERI-STRIP ANTIMIC 1/2X4 (GAUZE/BANDAGES/DRESSINGS) ×2 IMPLANT
DECANTER SPIKE VIAL GLASS SM (MISCELLANEOUS) ×2 IMPLANT
DRAPE MICROSCOPE LEICA (MISCELLANEOUS) ×2 IMPLANT
DRAPE POUCH INSTRU U-SHP 10X18 (DRAPES) ×2 IMPLANT
DRAPE SURG 17X11 SM STRL (DRAPES) ×2 IMPLANT
DRSG AQUACEL AG ADV 3.5X 6 (GAUZE/BANDAGES/DRESSINGS) ×2 IMPLANT
DRSG EMULSION OIL 3X3 NADH (GAUZE/BANDAGES/DRESSINGS) IMPLANT
DRSG PAD ABDOMINAL 8X10 ST (GAUZE/BANDAGES/DRESSINGS) IMPLANT
DRSG TELFA 4X5 ISLAND ADH (GAUZE/BANDAGES/DRESSINGS) IMPLANT
DURAPREP 26ML APPLICATOR (WOUND CARE) ×2 IMPLANT
ELECT REM PT RETURN 9FT ADLT (ELECTROSURGICAL) ×2
ELECTRODE REM PT RTRN 9FT ADLT (ELECTROSURGICAL) ×1 IMPLANT
GLOVE BIOGEL PI IND STRL 8 (GLOVE) ×2 IMPLANT
GLOVE BIOGEL PI INDICATOR 8 (GLOVE) ×2
GLOVE ECLIPSE 6.5 STRL STRAW (GLOVE) IMPLANT
GLOVE INDICATOR 6.5 STRL GRN (GLOVE) IMPLANT
GLOVE SURG SS PI 8.0 STRL IVOR (GLOVE) ×4 IMPLANT
GOWN STRL NON-REIN LRG LVL3 (GOWN DISPOSABLE) IMPLANT
GOWN STRL REIN XL XLG (GOWN DISPOSABLE) ×4 IMPLANT
KIT BASIN OR (CUSTOM PROCEDURE TRAY) ×2 IMPLANT
KIT POSITIONING SURG ANDREWS (MISCELLANEOUS) ×2 IMPLANT
MANIFOLD NEPTUNE II (INSTRUMENTS) ×2 IMPLANT
NEEDLE SPNL 18GX3.5 QUINCKE PK (NEEDLE) ×4 IMPLANT
PATTIES SURGICAL .5 X.5 (GAUZE/BANDAGES/DRESSINGS) IMPLANT
PATTIES SURGICAL .75X.75 (GAUZE/BANDAGES/DRESSINGS) IMPLANT
PATTIES SURGICAL 1X1 (DISPOSABLE) IMPLANT
SPONGE SURGIFOAM ABS GEL 100 (HEMOSTASIS) ×2 IMPLANT
STAPLER VISISTAT (STAPLE) IMPLANT
STRIP CLOSURE SKIN 1/2X4 (GAUZE/BANDAGES/DRESSINGS) IMPLANT
SUT PROLENE 3 0 PS 2 (SUTURE) IMPLANT
SUT VIC AB 0 CT1 27 (SUTURE)
SUT VIC AB 0 CT1 27XBRD ANTBC (SUTURE) IMPLANT
SUT VIC AB 1 CT1 27 (SUTURE)
SUT VIC AB 1 CT1 27XBRD ANTBC (SUTURE) IMPLANT
SUT VIC AB 1-0 CT2 27 (SUTURE) IMPLANT
SUT VIC AB 2-0 CT1 27 (SUTURE) ×2
SUT VIC AB 2-0 CT1 TAPERPNT 27 (SUTURE) ×2 IMPLANT
SUT VICRYL 0 UR6 27IN ABS (SUTURE) IMPLANT
SYRINGE 10CC LL (SYRINGE) IMPLANT
TRAY LAMINECTOMY (CUSTOM PROCEDURE TRAY) ×2 IMPLANT
YANKAUER SUCT BULB TIP NO VENT (SUCTIONS) IMPLANT

## 2012-05-21 NOTE — H&P (Signed)
Pamela Haley is an 61 y.o. female.   Chief Complaint: right leg pain HPI: HNP Stenosis synovial cyst right L45  Past Medical History  Diagnosis Date  . Abnormal EKG     HX OF PREVIOUS EKGS SHOWING POSSIBLE MI--BUT PT HAS NEVER HAD ANY HEART PROBLEMS--HAD BACK SURGERY AUG 2012 AT [facility] - NO HEART PROBLEMS  . Chronic cough     PT IS A SMOKER  . DDD (degenerative disc disease), lumbar     PAIN IN LOWER BACK AND DOWN RT HIP AND LEG--NUMBNESS, THROBBING, BURNING    Past Surgical History  Procedure Date  . Back surgery 05/2011    lumbar surgery at [facility]  . C sections x 3   . Bilateral carpal tunnel release     No family history on file. Social History:  reports that she has been smoking Cigarettes.  She has a 30 pack-year smoking history. She has never used smokeless tobacco. She reports that she does not drink alcohol or use illicit drugs.  Allergies: No Known Allergies  No prescriptions prior to admission    No results found for this or any previous visit (from the past 48 hour(s)). No results found.  Review of Systems  Musculoskeletal: Positive for back pain.  Neurological: Positive for sensory change and focal weakness.  All other systems reviewed and are negative.    There were no vitals taken for this visit. Physical Exam  Vitals reviewed. Constitutional: She is oriented to person, place, and time. She appears well-nourished.  HENT:  Head: Normocephalic.  Eyes: Pupils are equal, round, and reactive to light.  Neck: Normal range of motion.  Cardiovascular: Normal rate.   Respiratory: Effort normal.  GI: Soft.  Musculoskeletal:       +SLR right. EHL 5-/5 right. 2+ pulses. No DVT.  Neurological: She is alert and oriented to person, place, and time. She displays abnormal reflex.  Skin: Skin is warm and dry.     Assessment/Plan Right L5 radiculopathy due to Stenosis, synovial cyst L45 right refractory. Tobacco use. Plan decompression and excision synovial cyst  right. Risks discussed. Tobacco cession.  Dontavis Tschantz C 05/21/2012, 7:57 AM

## 2012-05-21 NOTE — Transfer of Care (Signed)
Immediate Anesthesia Transfer of Care Note  Patient: Pamela Haley  Procedure(s) Performed: Procedure(s) (LRB): DECOMPRESSIVE LUMBAR LAMINECTOMY LEVEL 1 (Right)  Patient Location: PACU  Anesthesia Type: General  Level of Consciousness: awake, alert  and oriented  Airway & Oxygen Therapy: Patient Spontanous Breathing and Patient connected to face mask oxygen  Post-op Assessment: Report given to PACU RN and Post -op Vital signs reviewed and stable  Post vital signs: Reviewed and stable  Complications: No apparent anesthesia complications

## 2012-05-21 NOTE — Brief Op Note (Signed)
05/21/2012  1:19 PM  PATIENT:  Pamela Haley  61 y.o. female  PRE-OPERATIVE DIAGNOSIS:  stenosis and synovial cyst   POST-OPERATIVE DIAGNOSIS:  stenosis and synovial cyst   PROCEDURE:  Procedure(s) (LRB): DECOMPRESSIVE LUMBAR LAMINECTOMY LEVEL 1 (Right)  SURGEON:  Surgeon(s) and Role:    * Javier Docker, MD - Primary    * Jacki Cones, MD - Assisting  PHYSICIAN ASSISTANT:   ASSISTANTS: gioffre   ANESTHESIA:   general  EBL:  Total I/O In: 2000 [I.V.:2000] Out: -   BLOOD ADMINISTERED:none  DRAINS: none   LOCAL MEDICATIONS USED:  MARCAINE     SPECIMEN:  Source of Specimen:  L45 disc  DISPOSITION OF SPECIMEN:  N/A  COUNTS:  YES  TOURNIQUET:  * No tourniquets in log *  DICTATION: .Other Dictation: Dictation Number [id number]  PLAN OF CARE: Admit for overnight observation  PATIENT DISPOSITION:  PACU - hemodynamically stable.   Delay start of Pharmacological VTE agent (>24hrs) due to surgical blood loss or risk of bleeding: yes

## 2012-05-21 NOTE — Anesthesia Postprocedure Evaluation (Signed)
  Anesthesia Post-op Note  Patient: Pamela Haley  Procedure(s) Performed: Procedure(s) (LRB): DECOMPRESSIVE LUMBAR LAMINECTOMY LEVEL 1 (Right)  Patient Location: PACU  Anesthesia Type: General  Level of Consciousness: awake and alert   Airway and Oxygen Therapy: Patient Spontanous Breathing  Post-op Pain: mild  Post-op Assessment: Post-op Vital signs reviewed, Patient's Cardiovascular Status Stable, Respiratory Function Stable, Patent Airway and No signs of Nausea or vomiting  Post-op Vital Signs: stable  Complications: No apparent anesthesia complications

## 2012-05-21 NOTE — Anesthesia Preprocedure Evaluation (Addendum)
Anesthesia Evaluation  Patient identified by MRN, date of birth, ID band Patient awake    Reviewed: Allergy & Precautions, H&P , NPO status , Patient's Chart, lab work & pertinent test results  Airway Mallampati: II TM Distance: >3 FB Neck ROM: Full    Dental No notable dental hx.    Pulmonary neg pulmonary ROS, Current Smoker,  breath sounds clear to auscultation  Pulmonary exam normal       Cardiovascular Exercise Tolerance: Good - anginanegative cardio ROS  Rhythm:Regular Rate:Normal  EKG - q waves in anterior leads. Pt states she has had these on her ekg since age 61. Stress test at [place] 10 yrs ago was normal by pt report   Neuro/Psych negative neurological ROS  negative psych ROS   GI/Hepatic negative GI ROS, Neg liver ROS,   Endo/Other  negative endocrine ROS  Renal/GU negative Renal ROS  negative genitourinary   Musculoskeletal negative musculoskeletal ROS (+)   Abdominal   Peds negative pediatric ROS (+)  Hematology negative hematology ROS (+)   Anesthesia Other Findings   Reproductive/Obstetrics negative OB ROS                          Anesthesia Physical Anesthesia Plan  ASA: II  Anesthesia Plan: General   Post-op Pain Management:    Induction: Intravenous  Airway Management Planned: Oral ETT  Additional Equipment:   Intra-op Plan:   Post-operative Plan: Extubation in OR  Informed Consent: I have reviewed the patients History and Physical, chart, labs and discussed the procedure including the risks, benefits and alternatives for the proposed anesthesia with the patient or authorized representative who has indicated his/her understanding and acceptance.   Dental advisory given  Plan Discussed with: CRNA  Anesthesia Plan Comments:         Anesthesia Quick Evaluation

## 2012-05-22 MED ORDER — HYDROCODONE-ACETAMINOPHEN 5-325 MG PO TABS: 1.0000 | ORAL_TABLET | ORAL | Status: AC | PRN

## 2012-05-22 NOTE — Op Note (Signed)
NAMEPANSY, OSTROVSKY NO.:  [id number]  MEDICAL RECORD NO.:  [id number]  LOCATION:  1619                         FACILITY:  [facility]  PHYSICIAN:  Jene Every, M.D.    DATE OF BIRTH:  Jun 10, 1951  DATE OF PROCEDURE:  05/21/2012 DATE OF DISCHARGE:                              OPERATIVE REPORT   PREOPERATIVE DIAGNOSIS:  Spinal stenosis, lateral recess stenosis, synovial cyst, L4-5 right status post lumbar decompression.  POSTOPERATIVE DIAGNOSIS:  Spinal stenosis, lateral recess stenosis synovial cyst, L4-5 right status post lumbar decompression, disk herniation recurrent.  PROCEDURES PERFORMED: 1. Redo lumbar decompression, L4-5; right. 2. Foraminotomies L5 and L4. 3. Microdiskectomy L4-5, right. 4. Excision of synovial cyst.  ANESTHESIA:  General.  ASSISTANT:  Jene Every, M.D.  HISTORY:  A 61 year old, history of lumbar decompression in the past, had recurrent lower extremity radicular pain.  No back pain, disk degeneration 5-1, had severe lateral recess stenosis 4-5 on right with a synovial cyst emanating from the facet and she had refractory conservative treatment, due to only leg pain and minimal back pain, this is indicated for decompression.  Risks and benefits discussed including bleeding, infection, damage to neurovascular structures, no change in symptoms, worsening symptoms, need for repeat debridement, DVT, PE, anesthetic complications, etc.  TECHNIQUE:  With the patient in supine position, after induction of adequate general anesthesia, 2 g Kefzol, she was placed prone on the [place] frame.  All bony prominences were well padded.  Lumbar region was prepped and draped in usual sterile fashion.  Previous surgical incision was utilized to excise the scar.  Subcutaneous tissue was dissected.  Electrocautery was utilized to achieve hemostasis.  Copious amount of Marcaine with epinephrine was infiltrated in the joint. Dorsolumbar fascia  identified, divided in line with skin incision. Paraspinous muscle elevated from __________ 4-5.  McCullough retractor was placed.  Operating microscope was draped on the surgical field after confirmatory radiograph.  Curette utilized to skeletonized previous laminotomy of 4 and 5 with lamina.  Facet was noted as well, significantly hypertrophic. She had fairly soft bone and performed partial medial facetectomy, proximal one-third of the medial aspect of the facet, removing the inferior portion of 4 identifying the superior articulating process of 5.  We detached ligamentum flavum with a micro curette.  Performed a foraminotomy of 5 with a 2 mm Kerrison.  I then removed the medial third of the superior articulating process of 5 with a 2 mm Kerrison to the decompressing lateral recess and medial border pedicles where stenosis noted here with epidural venous plexus which was cauterized as well.  We performed a foraminotomy of 5.  Mobilizing the 5 root medially, performed foraminotomy of 4.  Within the facet itself which is hypertrophic, there was the area that when we removed the bony portion, leaked some synovial fluid.  There was no discrete mass, however there was a small disk herniation at 4-5.  I performed an annulotomy and removed disk material with micro pituitary, further mobilizing it with a Woodson retractor, both medially and from the opposite side of the table.  Hockey-stick probe passed freely up the foramen of 4 and 5.  No residual compression of 5 or  4 roots, we checked beneath thecal sac, the axilla shoulder of the root without residual neural compression.  I feel this was a facet hypertrophy with some expansion of the synovial joint and the disk herniation compressing the 5 root into the foramen of 4.  Following this, irrigated the disk space with antibiotic irrigation and obtained a confirmatory radiograph, removed the McCullough retractor.  Paraspinous muscles were  irrigated. There was no CSF leakage or active bleeding prior to this.  Repaired the dorsolumbar fascia with 1 Vicryl interrupted figure-of-eight sutures, subcu with 2-0 Vicryl simple sutures.  Skin was reapproximated with 4-0 subcuticular Prolene.  Wound reinforced with Steri-Strips.  Sterile dressing applied, placed supine on hospital bed, extubated without difficulty, and transported to the recovery room in satisfactory condition.  The patient tolerated the procedure well.  No complications.     Jene Every, M.D.     Cordelia Pen  D:  05/21/2012  T:  05/22/2012  Job:  [id number]

## 2012-05-22 NOTE — Progress Notes (Signed)
Subjective: 1 Day Post-Op Procedure(s) (LRB): DECOMPRESSIVE LUMBAR LAMINECTOMY LEVEL 1 (Right) Patient reports pain as 2 on 0-10 scale.    Objective: Vital signs in last 24 hours: Temp:  [96.8 F (36 C)-98.4 F (36.9 C)] 98.3 F (36.8 C) (07/26 0611) Pulse Rate:  [68-110] 72  (07/26 0611) Resp:  [14-22] 14  (07/26 0611) BP: (91-142)/(55-80) 95/57 mmHg (07/26 0611) SpO2:  [66 %-100 %] 95 % (07/26 0611) Weight:  [54.432 kg (120 lb)] 54.432 kg (120 lb) (07/25 1609)  Intake/Output from previous day: 07/25 0701 - 07/26 0700 In: 4411.3 [P.O.:240; I.V.:4071.3; IV Piggyback:100] Out: 3250 [Urine:3250] Intake/Output this shift:    No results found for this basename: HGB:5 in the last 72 hours No results found for this basename: WBC:2,RBC:2,HCT:2,PLT:2 in the last 72 hours No results found for this basename: NA:2,K:2,CL:2,CO2:2,BUN:2,CREATININE:2,GLUCOSE:2,CALCIUM:2 in the last 72 hours No results found for this basename: LABPT:2,INR:2 in the last 72 hours  Neurologically intact ABD soft Neurovascular intact Intact pulses distally Dorsiflexion/Plantar flexion intact Incision: dressing C/D/I  Assessment/Plan: 1 Day Post-Op Procedure(s) (LRB): DECOMPRESSIVE LUMBAR LAMINECTOMY LEVEL 1 (Right) Advance diet Discharge home with home health. DC instr given.  Pamela Haley C 05/22/2012, 7:03 AM

## 2012-05-22 NOTE — Progress Notes (Signed)
OT Note Order received, chart reviewed. PT states that pt is mod I with all mobility and ADLs. Pt presents with no OT needs at this time. Will sign off.  Garrel Ridgel, OTR/L  Pager [phone] 05/22/2012

## 2012-05-22 NOTE — Care Management Note (Signed)
    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 NO HH OR DME NEEDS   Anticipated DC Date:  05/22/2012   Anticipated DC Plan:  HOME/SELF CARE  In-house referral  NA      DC Planning Services  NA      PAC Choice  NA   Choice offered to / List presented to:  NA   DME arranged  NA      DME agency  NA     HH arranged  NA      HH agency  NA   Status of service:  Completed, signed off Medicare Important Message given?  NO (If response is "NO", the following Medicare IM given date fields will be blank) Date Medicare IM given:   Date Additional Medicare IM given:    Discharge Disposition:  HOME/SELF CARE  Per UR Regulation:  Reviewed for med. necessity/level of care/duration of stay  If discussed at Long Length of Stay Meetings, dates discussed:    Comments:

## 2012-05-22 NOTE — Evaluation (Signed)
Physical Therapy Evaluation Patient Details Name: Pamela Haley MRN: [id number] DOB: [birth year] Today's Date: 05/22/2012 Time: 0812-0830 PT Time Calculation (min): 18 min  PT Assessment / Plan / Recommendation Clinical Impression  Pt s/p back surgery for cyst removal mobilizing well with good balance and awareness of back precautions.  Pt ready for d/c this date with family assist    PT Assessment  Patent does not need any further PT services    Follow Up Recommendations  No PT follow up    Barriers to Discharge        Equipment Recommendations  None recommended by PT    Recommendations for Other Services     Frequency      Precautions / Restrictions Precautions Precautions: Back Precaution Comments: Pt aware of precautions from previous surgery Restrictions Weight Bearing Restrictions: No   Pertinent Vitals/Pain 4/10; premedicated      Mobility  Bed Mobility Bed Mobility: Supine to Sit Supine to Sit: 6: Modified independent (Device/Increase time) Details for Bed Mobility Assistance: min cues for log roll Transfers Transfers: Sit to Stand;Stand to Sit Sit to Stand: 6: Modified independent (Device/Increase time) Stand to Sit: 6: Modified independent (Device/Increase time) Ambulation/Gait Ambulation/Gait Assistance: 5: Supervision;7: Independent Ambulation Distance (Feet): 400 Feet Ambulation/Gait Assistance Details: min cues for speed Gait Pattern: Within Functional Limits    Exercises     PT Diagnosis:    PT Problem List:   PT Treatment Interventions:     PT Goals    Visit Information  Last PT Received On: 05/22/12 Assistance Needed: +1    Subjective Data  Subjective: My leg pain is gone and its just in my back Patient Stated Goal: resume previous lifestyle with decreased pain   Prior Functioning  Home Living Lives With: Alone Available Help at Discharge: Family Type of Home: House Home Access: Stairs to enter Secretary/administrator of Steps:  1 Entrance Stairs-Rails: None Home Layout: Able to live on main level with bedroom/bathroom Home Adaptive Equipment: Walker - rolling;Bedside commode/3-in-1 Prior Function Level of Independence: Independent Able to Take Stairs?: Yes Driving: Yes Vocation: Full time employment Communication Communication: No difficulties Dominant Hand: Right    Cognition  Overall Cognitive Status: Appears within functional limits for tasks assessed/performed Arousal/Alertness: Awake/alert Orientation Level: Appears intact for tasks assessed Behavior During Session: [facility] for tasks performed    Extremity/Trunk Assessment Right Upper Extremity Assessment RUE ROM/Strength/Tone: [facility] for tasks assessed Left Upper Extremity Assessment LUE ROM/Strength/Tone: [facility] for tasks assessed Right Lower Extremity Assessment RLE ROM/Strength/Tone: [facility] for tasks assessed Left Lower Extremity Assessment LLE ROM/Strength/Tone: WFL for tasks assessed   Balance    End of Session PT - End of Session Activity Tolerance: Patient tolerated treatment well Patient left: in chair;with call bell/phone within reach Nurse Communication: Mobility status  GP Functional Limitation: Mobility: Walking and moving around Mobility: Walking and Moving Around Current Status (A2130): At least 1 percent but less than 20 percent impaired, limited or restricted Mobility: Walking and Moving Around Goal Status [phone]): At least 1 percent but less than 20 percent impaired, limited or restricted Mobility: Walking and Moving Around Discharge Status [phone]): At least 1 percent but less than 20 percent impaired, limited or restricted   Carmon Sahli 05/22/2012, 8:51 AM

## 2012-05-22 NOTE — Discharge Summary (Signed)
Physician Discharge Summary   Patient ID: Pamela Haley MRN: [id number] DOB/AGE: 61/16/1952 61 y.o.  Admit date: 05/21/2012 Discharge date: 05/22/2012  Primary Diagnosis:   stenosis and synovial cyst   Admission Diagnoses:  Past Medical History  Diagnosis Date  . Abnormal EKG     HX OF PREVIOUS EKGS SHOWING POSSIBLE MI--BUT PT HAS NEVER HAD ANY HEART PROBLEMS--HAD BACK SURGERY AUG 2012 AT [facility] - NO HEART PROBLEMS  . Chronic cough     PT IS A SMOKER  . DDD (degenerative disc disease), lumbar     PAIN IN LOWER BACK AND DOWN RT HIP AND LEG--NUMBNESS, THROBBING, BURNING   Discharge Diagnoses:   Principal Problem:  *Synovial cyst of lumbar spine Active Problems:  HNP (herniated nucleus pulposus), lumbar  Procedure:  Procedure(s) (LRB): DECOMPRESSIVE LUMBAR LAMINECTOMY LEVEL 1 (Right)   Consults: None  HPI:  Recurrent stenosis and synovial cyst L45 right.    Laboratory Data: Hospital Outpatient Visit on 05/15/2012  Component Date Value Range Status  . MRSA, PCR 05/15/2012 NEGATIVE  NEGATIVE Final  . Staphylococcus aureus 05/15/2012 NEGATIVE  NEGATIVE Final   Comment:                                 The Xpert SA Assay (FDA                          approved for NASAL specimens                          only), is one component of                          a comprehensive surveillance                          program.  It is not intended                          to diagnose infection nor to                          guide or monitor treatment.  . WBC 05/15/2012 9.5  4.0 - 10.5 K/uL Final  . RBC 05/15/2012 5.13* 3.87 - 5.11 MIL/uL Final  . Hemoglobin 05/15/2012 16.1* 12.0 - 15.0 g/dL Final  . HCT 56/21/3086 46.4* 36.0 - 46.0 % Final  . MCV 05/15/2012 90.4  78.0 - 100.0 fL Final  . MCH 05/15/2012 31.4  26.0 - 34.0 pg Final  . MCHC 05/15/2012 34.7  30.0 - 36.0 g/dL Final  . RDW 57/84/6962 12.6  11.5 - 15.5 % Final  . Platelets 05/15/2012 235  150 - 400 K/uL Final  .  Neutrophils Relative 05/15/2012 46  43 - 77 % Final  . Neutro Abs 05/15/2012 4.3  1.7 - 7.7 K/uL Final  . Lymphocytes Relative 05/15/2012 42  12 - 46 % Final  . Lymphs Abs 05/15/2012 4.0  0.7 - 4.0 K/uL Final  . Monocytes Relative 05/15/2012 8  3 - 12 % Final  . Monocytes Absolute 05/15/2012 0.8  0.1 - 1.0 K/uL Final  . Eosinophils Relative 05/15/2012 3  0 - 5 % Final  . Eosinophils Absolute 05/15/2012 0.3  0.0 - 0.7 K/uL Final  .  Basophils Relative 05/15/2012 1  0 - 1 % Final  . Basophils Absolute 05/15/2012 0.1  0.0 - 0.1 K/uL Final  . Sodium 05/15/2012 136  135 - 145 mEq/L Final  . Potassium 05/15/2012 3.9  3.5 - 5.1 mEq/L Final  . Chloride 05/15/2012 100  96 - 112 mEq/L Final  . CO2 05/15/2012 27  19 - 32 mEq/L Final  . Glucose, Bld 05/15/2012 85  70 - 99 mg/dL Final  . BUN 16/07/9603 10  6 - 23 mg/dL Final  . Creatinine, Ser 05/15/2012 0.66  0.50 - 1.10 mg/dL Final  . Calcium 54/06/8118 9.5  8.4 - 10.5 mg/dL Final  . Total Protein 05/15/2012 7.1  6.0 - 8.3 g/dL Final  . Albumin 14/78/2956 3.5  3.5 - 5.2 g/dL Final  . AST 21/30/8657 15  0 - 37 U/L Final  . ALT 05/15/2012 10  0 - 35 U/L Final  . Alkaline Phosphatase 05/15/2012 94  39 - 117 U/L Final  . Total Bilirubin 05/15/2012 0.2* 0.3 - 1.2 mg/dL Final  . GFR calc non Af Amer 05/15/2012 >90  >90 mL/min Final  . GFR calc Af Amer 05/15/2012 >90  >90 mL/min Final   Comment:                                 The eGFR has been calculated                          using the CKD EPI equation.                          This calculation has not been                          validated in all clinical                          situations.                          eGFR's persistently                          <90 mL/min signify                          possible Chronic Kidney Disease.  . Color, Urine 05/15/2012 YELLOW  YELLOW Final  . APPearance 05/15/2012 CLEAR  CLEAR Final  . Specific Gravity, Urine 05/15/2012 1.018  1.005 - 1.030 Final    . pH 05/15/2012 5.5  5.0 - 8.0 Final  . Glucose, UA 05/15/2012 NEGATIVE  NEGATIVE mg/dL Final  . Hgb urine dipstick 05/15/2012 NEGATIVE  NEGATIVE Final  . Bilirubin Urine 05/15/2012 NEGATIVE  NEGATIVE Final  . Ketones, ur 05/15/2012 NEGATIVE  NEGATIVE mg/dL Final  . Protein, ur 84/69/6295 NEGATIVE  NEGATIVE mg/dL Final  . Urobilinogen, UA 05/15/2012 0.2  0.0 - 1.0 mg/dL Final  . Nitrite 28/41/3244 NEGATIVE  NEGATIVE Final  . Leukocytes, UA 05/15/2012 SMALL* NEGATIVE Final  . Squamous Epithelial / LPF 05/15/2012 FEW* RARE Final  . WBC, UA 05/15/2012 3-6  <3 WBC/hpf Final   No results found for this basename: HGB:5 in the last 72 hours No results found for  this basename: WBC:2,RBC:2,HCT:2,PLT:2 in the last 72 hours No results found for this basename: NA:2,K:2,CL:2,CO2:2,BUN:2,CREATININE:2,GLUCOSE:2,CALCIUM:2 in the last 72 hours No results found for this basename: LABPT:2,INR:2 in the last 72 hours  X-Rays:Dg Chest 2 View  05/15/2012  *RADIOLOGY REPORT*  Clinical Data: Chronic cough.  CHEST - 2 VIEW  Comparison: Chest x-ray 06/19/2011.  Findings: Lungs appear hyperexpanded with flattening of the hemidiaphragms, increased retrosternal air space and pruning of the pulmonary vasculature in the periphery, suggestive of underlying COPD.  No acute consolidative airspace disease.  No pleural effusions.  No evidence of pulmonary edema.  Heart size and mediastinal contours are unremarkable.  Atherosclerosis in the thoracic aorta.  Mild bilateral apical pleuroparenchymal thickening is unchanged.  IMPRESSION: 1.  No radiographic evidence of acute cardiopulmonary disease. 2.  Morphologic changes compatible with COPD, as above. 3.  Atherosclerosis.  Original Report Authenticated By: Florencia Reasons, M.D.   Dg Lumbar Spine 2-3 Views  05/15/2012  *RADIOLOGY REPORT*  Clinical Data: Preop for review of decompression and L4-5  LUMBAR SPINE - 2-3 VIEW  Comparison: Lumbar spine films of 06/19/2011  Findings: The  lumbar vertebrae remain unchanged in alignment. Degenerative disc disease at L5-S1 is again noted.  The remainder of disc spaces appear stable.  The lumbar vertebrae were numbered as previously prior to surgery.  The SI joints are unremarkable.  IMPRESSION: Normal alignment with degenerative disc disease again noted at L5 S1.  Original Report Authenticated By: Juline Patch, M.D.   Dg Spine Portable 1 View  05/21/2012  *RADIOLOGY REPORT*  Clinical Data: Intraoperative localization.  PORTABLE SPINE - 1 VIEW  Comparison: 05/21/2012, 1202 hours.  Findings: Numbering used on prior exam preserved.  There are probes present overlying the L4-L5 neural foramen and the inferior probe overlies the L5 pedicles.  Soft tissue retractors are present dorsal to L5.  IMPRESSION: Intraoperative localization at the L4-L5 interspace.  Original Report Authenticated By: Andreas Newport, M.D.   Dg Spine Portable 1 View  05/21/2012  *RADIOLOGY REPORT*  Clinical Data: Lumbar spine surgery  PORTABLE SPINE - 1 VIEW  Comparison: Portable exam 1202 hours compared to 05/15/2012  Findings: Cross-table lateral intraoperative view obtained. Prior radiographs are labeled with five non-rib bearing lumbar type vertebrae. Surgical hardware is identified posterior to the mid L5 vertebral body. Slight disc space narrowing at L4-L5. Atherosclerotic calcifications noted.  IMPRESSION: Posterior localization of the mid L5 level.  Original Report Authenticated By: Lollie Marrow, M.D.    EKG: Orders placed during the hospital encounter of 05/21/12  . EKG 12-LEAD  . EKG 12-LEAD     Hospital Course: Patient was admitted to [facility] and taken to the OR and underwent the above state procedure without complications.  Patient tolerated the procedure well and was later transferred to the recovery room and then to the orthopaedic floor for postoperative care.  They were given PO and IV analgesics for pain control following their surgery.   They were given 24 hours of postoperative antibiotics.   PT was consulted postop to assist with mobility and transfers.  The patient was allowed to be WBAT with therapy and was taught back precautions. Discharge planning was consulted to help with postop disposition and equipment needs.  Patient had a good night on the evening of surgery and started to get up OOB with therapy on day one. Patient was seen in rounds and was ready to go home on day one.  They were given discharge instructions and dressing directions.  They were instructed on when to follow up in the office with Dr. Shelle Iron.  Discharge Medications: Prior to Admission medications   Medication Sig Start Date End Date Taking? Authorizing Provider  HYDROcodone-acetaminophen (NORCO/VICODIN) 5-325 MG per tablet Take 1 tablet by mouth every 8 (eight) hours as needed. For pain    Historical Provider, MD  HYDROcodone-acetaminophen (NORCO/VICODIN) 5-325 MG per tablet Take 1-2 tablets by mouth every 4 (four) hours as needed. 05/22/12 06/01/12  Javier Docker, MD    Diet: Regular diet Activity:WBAT Follow-up:in 10 days Disposition - Home Discharged Condition: good    Medication List  As of 05/22/2012  7:07 AM   TAKE these medications         HYDROcodone-acetaminophen 5-325 MG per tablet   Commonly known as: NORCO/VICODIN   Take 1 tablet by mouth every 8 (eight) hours as needed. For pain      HYDROcodone-acetaminophen 5-325 MG per tablet   Commonly known as: NORCO/VICODIN   Take 1-2 tablets by mouth every 4 (four) hours as needed.           Follow-up Information    Follow up with Tanaia Hawkey C, MD in 10 days.   Contact information:   [facility] [address], [address] [place] [address] [phone]          Signed: Javier Docker 05/22/2012, 7:07 AM

## 2013-10-25 ENCOUNTER — Ambulatory Visit: Payer: Self-pay | Admitting: Family Medicine

## 2015-02-20 ENCOUNTER — Encounter: Payer: Self-pay | Admitting: Internal Medicine

## 2015-02-20 ENCOUNTER — Ambulatory Visit (INDEPENDENT_AMBULATORY_CARE_PROVIDER_SITE_OTHER): Payer: Managed Care, Other (non HMO) | Admitting: Internal Medicine

## 2015-02-20 VITALS — BP 120/70 | HR 88 | Temp 98.2°F | Ht 64.25 in | Wt 120.0 lb

## 2015-02-20 DIAGNOSIS — E0590 Thyrotoxicosis, unspecified without thyrotoxic crisis or storm: Secondary | ICD-10-CM

## 2015-02-20 DIAGNOSIS — M5136 Other intervertebral disc degeneration, lumbar region: Secondary | ICD-10-CM | POA: Diagnosis not present

## 2015-02-20 HISTORY — DX: Thyrotoxicosis, unspecified without thyrotoxic crisis or storm: E05.90

## 2015-02-20 NOTE — Progress Notes (Signed)
Pre visit review using our clinic review tool, if applicable. No additional management support is needed unless otherwise documented below in the visit note. 

## 2015-02-20 NOTE — Patient Instructions (Signed)
Back Pain, Adult Low back pain is very common. About 1 in 5 people have back pain.The cause of low back pain is rarely dangerous. The pain often gets better over time.About half of people with a sudden onset of back pain feel better in just 2 weeks. About 8 in 10 people feel better by 6 weeks.  CAUSES Some common causes of back pain include:  Strain of the muscles or ligaments supporting the spine.  Wear and tear (degeneration) of the spinal discs.  Arthritis.  Direct injury to the back. DIAGNOSIS Most of the time, the direct cause of low back pain is not known.However, back pain can be treated effectively even when the exact cause of the pain is unknown.Answering your caregiver's questions about your overall health and symptoms is one of the most accurate ways to make sure the cause of your pain is not dangerous. If your caregiver needs more information, he or she may order lab work or imaging tests (X-rays or MRIs).However, even if imaging tests show changes in your back, this usually does not require surgery. HOME CARE INSTRUCTIONS For many people, back pain returns.Since low back pain is rarely dangerous, it is often a condition that people can learn to manageon their own.   Remain active. It is stressful on the back to sit or stand in one place. Do not sit, drive, or stand in one place for more than 30 minutes at a time. Take short walks on level surfaces as soon as pain allows.Try to increase the length of time you walk each day.  Do not stay in bed.Resting more than 1 or 2 days can delay your recovery.  Do not avoid exercise or work.Your body is made to move.It is not dangerous to be active, even though your back may hurt.Your back will likely heal faster if you return to being active before your pain is gone.  Pay attention to your body when you bend and lift. Many people have less discomfortwhen lifting if they bend their knees, keep the load close to their bodies,and  avoid twisting. Often, the most comfortable positions are those that put less stress on your recovering back.  Find a comfortable position to sleep. Use a firm mattress and lie on your side with your knees slightly bent. If you lie on your back, put a pillow under your knees.  Only take over-the-counter or prescription medicines as directed by your caregiver. Over-the-counter medicines to reduce pain and inflammation are often the most helpful.Your caregiver may prescribe muscle relaxant drugs.These medicines help dull your pain so you can more quickly return to your normal activities and healthy exercise.  Put ice on the injured area.  Put ice in a plastic bag.  Place a towel between your skin and the bag.  Leave the ice on for 15-20 minutes, 03-04 times a day for the first 2 to 3 days. After that, ice and heat may be alternated to reduce pain and spasms.  Ask your caregiver about trying back exercises and gentle massage. This may be of some benefit.  Avoid feeling anxious or stressed.Stress increases muscle tension and can worsen back pain.It is important to recognize when you are anxious or stressed and learn ways to manage it.Exercise is a great option. SEEK MEDICAL CARE IF:  You have pain that is not relieved with rest or medicine.  You have pain that does not improve in 1 week.  You have new symptoms.  You are generally not feeling well. SEEK   IMMEDIATE MEDICAL CARE IF:   You have pain that radiates from your back into your legs.  You develop new bowel or bladder control problems.  You have unusual weakness or numbness in your arms or legs.  You develop nausea or vomiting.  You develop abdominal pain.  You feel faint. Document Released: 10/14/2005 Document Revised: 04/14/2012 Document Reviewed: 02/15/2014 Exit[employer] Patient Information 2015 [employer]. This information is not intended to replace advice given to you by your health care provider. Make sure you  discuss any questions you have with your health care provider.  

## 2015-02-20 NOTE — Progress Notes (Signed)
HPI  Pt presents to the clinic today to establish care. She is transferring care from Dr. Dear at [facility].  Flu: 07/2014 Tetanus: not sure when she had the last one Zostovax: never, did have chicken pox Pap Smear: 2011 Mammogram: 2011 Colon Screening: never Vision Screening: as needed Dentist: biannually  DDD lumbar spin. She reports she did have surgery April 2012. She takes Flexeril as needed when her back bothers her. It seems to work better that the Vicodin he has previously prescribed.  She does reports a remote history of hyperthyroidism. She had lost 25 lbs in 1 year. She had a thyroid ultrasound and iodine study which were normal. She was put on Tapazole but had trouble remembering to take it. She has had her levels checked multiple times in the past off medications and her labs have been normal.  Past Medical History  Diagnosis Date  . Abnormal EKG     HX OF PREVIOUS EKGS SHOWING POSSIBLE MI--BUT PT HAS NEVER HAD ANY HEART PROBLEMS--HAD BACK SURGERY AUG 2012 AT [facility] - NO HEART PROBLEMS  . Chronic cough     PT IS A SMOKER  . DDD (degenerative disc disease), lumbar     PAIN IN LOWER BACK AND DOWN RT HIP AND LEG--NUMBNESS, THROBBING, BURNING    Current Outpatient Prescriptions  Medication Sig Dispense Refill  . Ascorbic Acid (VITAMIN C) 1000 MG tablet Take 1,000 mg by mouth daily.    . Biotin 10 MG TABS Take 1 tablet by mouth daily.    . cyclobenzaprine (FLEXERIL) 10 MG tablet Take 10 mg by mouth as needed for muscle spasms.    . Multiple Vitamins-Minerals (HAIR/SKIN/NAILS PO) Take 1 capsule by mouth daily.     No current facility-administered medications for this visit.    No Known Allergies  Family History  Problem Relation Age of Onset  . Hearing loss Mother   . Heart disease Father   . Stroke Maternal Grandmother   . Diabetes Maternal Grandmother     History   Social History  . Marital Status: Single    Spouse Name: N/A  . Number of  Children: N/A  . Years of Education: N/A   Occupational History  . Not on file.   Social History Main Topics  . Smoking status: Current Every Day Smoker -- 1.00 packs/day for 30 years    Types: Cigarettes  . Smokeless tobacco: Never Used  . Alcohol Use: 0.0 oz/week    0 Standard drinks or equivalent per week     Comment: rare  . Drug Use: No  . Sexual Activity: Not on file   Other Topics Concern  . Not on file   Social History Narrative    ROS:  Constitutional: Denies fever, malaise, fatigue, headache or abrupt weight changes.  HEENT: Denies eye pain, eye redness, ear pain, ringing in the ears, wax buildup, runny nose, nasal congestion, bloody nose, or sore throat. Respiratory: Denies difficulty breathing, shortness of breath, cough or sputum production.   Cardiovascular: Denies chest pain, chest tightness, palpitations or swelling in the hands or feet.  Gastrointestinal: Denies abdominal pain, bloating, constipation, diarrhea or blood in the stool.  GU: Denies frequency, urgency, pain with urination, blood in urine, odor or discharge. Musculoskeletal: Denies decrease in range of motion, difficulty with gait, muscle pain or joint pain and swelling.  Skin: Denies redness, rashes, lesions or ulcercations.  Neurological: Denies dizziness, difficulty with memory, difficulty with speech or problems with balance and coordination.  No other specific complaints in a complete review of systems (except as listed in HPI above).  PE:  BP 120/70 mmHg  Pulse 88  Temp(Src) 98.2 F (36.8 C) (Oral)  Ht 5' 4.25" (1.632 m)  Wt 120 lb (54.432 kg)  BMI 20.44 kg/m2  SpO2 95% Wt Readings from Last 3 Encounters:  02/20/15 120 lb (54.432 kg)  05/21/12 120 lb (54.432 kg)    General: Appears her  stated age, well developed, well nourished in NAD. Neck:Neck supple, trachea midline. No masses, lumps or thyromegaly present.  Cardiovascular: Normal rate and rhythm. S1,S2 noted.  No murmur,  rubs or gallops noted.  Pulmonary/Chest: Normal effort and positive vesicular breath sounds. No respiratory distress. No wheezes, rales or ronchi noted.  Musculoskeletal: Normal flexion and extension of the back. Strength 5/5 BLE. No difficulty with gait.  Neurological: Alert and oriented.    BMET    Component Value Date/Time   NA 136 05/15/2012 1525   K 3.9 05/15/2012 1525   CL 100 05/15/2012 1525   CO2 27 05/15/2012 1525   GLUCOSE 85 05/15/2012 1525   BUN 10 05/15/2012 1525   CREATININE 0.66 05/15/2012 1525   CALCIUM 9.5 05/15/2012 1525   GFRNONAA >90 05/15/2012 1525   GFRAA >90 05/15/2012 1525    Lipid Panel  No results found for: CHOL, TRIG, HDL, CHOLHDL, VLDL, LDLCALC  CBC    Component Value Date/Time   WBC 9.5 05/15/2012 1525   RBC 5.13* 05/15/2012 1525   HGB 16.1* 05/15/2012 1525   HCT 46.4* 05/15/2012 1525   PLT 235 05/15/2012 1525   MCV 90.4 05/15/2012 1525   MCH 31.4 05/15/2012 1525   MCHC 34.7 05/15/2012 1525   RDW 12.6 05/15/2012 1525   LYMPHSABS 4.0 05/15/2012 1525   MONOABS 0.8 05/15/2012 1525   EOSABS 0.3 05/15/2012 1525   BASOSABS 0.1 05/15/2012 1525    Hgb A1C No results found for: HGBA1C   Assessment and Plan:  Make an appt for your annual exam

## 2015-02-20 NOTE — Assessment & Plan Note (Signed)
No issues off meds Will check TSH and Free T4 with physical labs yearly

## 2015-02-20 NOTE — Assessment & Plan Note (Signed)
Improved after her surgery She continues to take Flexeril prn

## 2015-03-09 ENCOUNTER — Encounter: Payer: Self-pay | Admitting: Internal Medicine

## 2015-03-09 ENCOUNTER — Ambulatory Visit (INDEPENDENT_AMBULATORY_CARE_PROVIDER_SITE_OTHER): Payer: Managed Care, Other (non HMO) | Admitting: Internal Medicine

## 2015-03-09 VITALS — BP 130/62 | HR 96 | Temp 98.6°F | Ht 64.5 in | Wt 121.2 lb

## 2015-03-09 DIAGNOSIS — Z0000 Encounter for general adult medical examination without abnormal findings: Secondary | ICD-10-CM | POA: Diagnosis not present

## 2015-03-09 DIAGNOSIS — Z124 Encounter for screening for malignant neoplasm of cervix: Secondary | ICD-10-CM | POA: Diagnosis not present

## 2015-03-09 DIAGNOSIS — Z720 Tobacco use: Secondary | ICD-10-CM | POA: Diagnosis not present

## 2015-03-09 DIAGNOSIS — Z1239 Encounter for other screening for malignant neoplasm of breast: Secondary | ICD-10-CM | POA: Diagnosis not present

## 2015-03-09 DIAGNOSIS — F17200 Nicotine dependence, unspecified, uncomplicated: Secondary | ICD-10-CM

## 2015-03-09 NOTE — Patient Instructions (Signed)

## 2015-03-09 NOTE — Progress Notes (Signed)
Pre visit review using our clinic review tool, if applicable. No additional management support is needed unless otherwise documented below in the visit note. 

## 2015-03-09 NOTE — Progress Notes (Signed)
Subjective:    Patient ID: Pamela Haley, female    DOB: October 01, 1951, 64 y.o.   MRN: [id number]  HPI  Pt presents to the clinic today for her annual exam.  Flu: 07/2014 Tetanus: not sure when she had the last one Zostovax: never, did have chicken pox Pap Smear: 2011 Mammogram: 2011 Colon Screening: never Vision Screening: as needed Dentist: biannually  Diet: She eats a lot of fruits and veggies. She does not eat a lot of fried food or fast food.  Exercise: She walks 3-4 days per week.  Review of Systems      Past Medical History  Diagnosis Date  . Abnormal EKG     HX OF PREVIOUS EKGS SHOWING POSSIBLE MI--BUT PT HAS NEVER HAD ANY HEART PROBLEMS--HAD BACK SURGERY AUG 2012 AT [facility] - NO HEART PROBLEMS  . Chronic cough     PT IS A SMOKER  . DDD (degenerative disc disease), lumbar     PAIN IN LOWER BACK AND DOWN RT HIP AND LEG--NUMBNESS, THROBBING, BURNING  . Hyperthyroidism 02/20/2015    Current Outpatient Prescriptions  Medication Sig Dispense Refill  . Ascorbic Acid (VITAMIN C) 1000 MG tablet Take 1,000 mg by mouth daily.    . Biotin 10 MG TABS Take 1 tablet by mouth daily.    . cyclobenzaprine (FLEXERIL) 10 MG tablet Take 10 mg by mouth as needed for muscle spasms.    . Multiple Vitamins-Minerals (HAIR/SKIN/NAILS PO) Take 1 capsule by mouth daily.     No current facility-administered medications for this visit.    No Known Allergies  Family History  Problem Relation Age of Onset  . Heart disease Mother   . Heart disease Father   . Stroke Maternal Grandmother   . Diabetes Maternal Grandmother     History   Social History  . Marital Status: Single    Spouse Name: N/A  . Number of Children: N/A  . Years of Education: N/A   Occupational History  . Not on file.   Social History Main Topics  . Smoking status: Current Every Day Smoker -- 1.00 packs/day for 30 years    Types: Cigarettes  . Smokeless tobacco: Never Used  . Alcohol Use: 0.0 oz/week    0  Standard drinks or equivalent per week     Comment: rare  . Drug Use: No  . Sexual Activity: Not Currently   Other Topics Concern  . Not on file   Social History Narrative     Constitutional: Denies fever, malaise, fatigue, headache or abrupt weight changes.  HEENT: Denies eye pain, eye redness, ear pain, ringing in the ears, wax buildup, runny nose, nasal congestion, bloody nose, or sore throat. Respiratory: Denies difficulty breathing, shortness of breath, cough or sputum production.   Cardiovascular: Denies chest pain, chest tightness, palpitations or swelling in the hands or feet.  Gastrointestinal: Denies abdominal pain, bloating, constipation, diarrhea or blood in the stool.  GU: Denies urgency, frequency, pain with urination, burning sensation, blood in urine, odor or discharge. Musculoskeletal: Denies decrease in range of motion, difficulty with gait, muscle pain or joint pain and swelling.  Skin: Denies redness, rashes, lesions or ulcercations.  Neurological: Denies dizziness, difficulty with memory, difficulty with speech or problems with balance and coordination.  Psych: Denies anxiety, depression, SI/HI.  No other specific complaints in a complete review of systems (except as listed in HPI above).  Objective:   Physical Exam  BP 130/62 mmHg  Pulse 96  Temp(Src) 98.6  F (37 C) (Oral)  Ht 5' 4.5" (1.638 m)  Wt 121 lb 4 oz (54.999 kg)  BMI 20.50 kg/m2  SpO2 93% Wt Readings from Last 3 Encounters:  03/09/15 121 lb 4 oz (54.999 kg)  02/20/15 120 lb (54.432 kg)  05/21/12 120 lb (54.432 kg)    General: Appears her stated age, well developed, well nourished in NAD. Skin: Warm, dry and intact. No rashes,noted. HEENT: Head: normal shape and size; Eyes: sclera white, no icterus, conjunctiva pink, PERRLA and EOMs intact; Ears: Tm's gray and intact, normal light reflex; Throat/Mouth: Teeth present, mucosa pink and moist, no exudate, lesions or ulcerations noted.  Neck:  Neck supple, trachea midline. No masses, lumps or thyromegaly present.  Cardiovascular: Normal rate and rhythm. S1,S2 noted.  No murmur, rubs or gallops noted.  Pulmonary/Chest: Normal effort and positive vesicular breath sounds. No respiratory distress. No wheezes, rales or ronchi noted.  Abdomen: Soft and nontender. Normal bowel sounds, no bruits noted. No distention or masses noted. Liver, spleen and kidneys non palpable. Pelvic: Breast without masses or lumps. No nipple discharge noted. Normal female anatomy. No discharge noted. Normal appearing cervix. No CMT. Adnexa non palpable. Musculoskeletal: No signs of joint swelling.Strength 5/5 BUE/BLE. No difficulty with gait.  Neurological: Alert and oriented. Cranial nerves II-XII grossly intact. Coordination normal.  Psychiatric: Mood and affect normal. Behavior is normal. Judgment and thought content normal.     BMET    Component Value Date/Time   NA 136 05/15/2012 1525   K 3.9 05/15/2012 1525   CL 100 05/15/2012 1525   CO2 27 05/15/2012 1525   GLUCOSE 85 05/15/2012 1525   BUN 10 05/15/2012 1525   CREATININE 0.66 05/15/2012 1525   CALCIUM 9.5 05/15/2012 1525   GFRNONAA >90 05/15/2012 1525   GFRAA >90 05/15/2012 1525    Lipid Panel  No results found for: CHOL, TRIG, HDL, CHOLHDL, VLDL, LDLCALC  CBC    Component Value Date/Time   WBC 9.5 05/15/2012 1525   RBC 5.13* 05/15/2012 1525   HGB 16.1* 05/15/2012 1525   HCT 46.4* 05/15/2012 1525   PLT 235 05/15/2012 1525   MCV 90.4 05/15/2012 1525   MCH 31.4 05/15/2012 1525   MCHC 34.7 05/15/2012 1525   RDW 12.6 05/15/2012 1525   LYMPHSABS 4.0 05/15/2012 1525   MONOABS 0.8 05/15/2012 1525   EOSABS 0.3 05/15/2012 1525   BASOSABS 0.1 05/15/2012 1525    Hgb A1C No results found for: HGBA1C       Assessment & Plan:   Preventative Health Maintenance:  CBC, CMET, Lipid and Vit D levels today Pap obtained today Mammogram ordered- she will call to schedule She does not want  to get the tetanus booster or zostovax today She declines colonoscopy but will obtain IFOB Encouraged her to visit an eye doctor annually Giving smoking history, will refer for lung cancer screening  RTC in 1 year or sooner if needed

## 2015-03-10 ENCOUNTER — Other Ambulatory Visit: Payer: Self-pay | Admitting: Internal Medicine

## 2015-03-10 ENCOUNTER — Telehealth: Payer: Self-pay | Admitting: Acute Care

## 2015-03-10 LAB — COMPREHENSIVE METABOLIC PANEL
ALBUMIN: 4.3 g/dL (ref 3.6–4.8)
ALT: 13 IU/L (ref 0–32)
AST: 12 IU/L (ref 0–40)
Albumin/Globulin Ratio: 1.4 (ref 1.1–2.5)
Alkaline Phosphatase: 98 IU/L (ref 39–117)
BILIRUBIN TOTAL: 0.3 mg/dL (ref 0.0–1.2)
BUN / CREAT RATIO: 12 (ref 11–26)
BUN: 9 mg/dL (ref 8–27)
CO2: 25 mmol/L (ref 18–29)
CREATININE: 0.74 mg/dL (ref 0.57–1.00)
Calcium: 9.4 mg/dL (ref 8.7–10.3)
Chloride: 99 mmol/L (ref 97–108)
GFR calc Af Amer: 100 mL/min/{1.73_m2} (ref >59)
GFR calc non Af Amer: 86 mL/min/{1.73_m2} (ref >59)
Globulin, Total: 3 g/dL (ref 1.5–4.5)
Glucose: 71 mg/dL (ref 65–99)
POTASSIUM: 4.1 mmol/L (ref 3.5–5.2)
Sodium: 143 mmol/L (ref 134–144)
Total Protein: 7.3 g/dL (ref 6.0–8.5)

## 2015-03-10 LAB — CBC
HEMATOCRIT: 48.8 % — AB (ref 34.0–46.6)
HEMOGLOBIN: 16.2 g/dL — AB (ref 11.1–15.9)
MCH: 30.9 pg (ref 26.6–33.0)
MCHC: 33.2 g/dL (ref 31.5–35.7)
MCV: 93 fL (ref 79–97)
Platelets: 249 10*3/uL (ref 150–379)
RBC: 5.24 x10E6/uL (ref 3.77–5.28)
RDW: 13.2 % (ref 12.3–15.4)
WBC: 9.5 10*3/uL (ref 3.4–10.8)

## 2015-03-10 LAB — LIPID PANEL
CHOL/HDL RATIO: 4.7 ratio — AB (ref 0.0–4.4)
Cholesterol, Total: 217 mg/dL — ABNORMAL HIGH (ref 100–199)
HDL: 46 mg/dL (ref >39)
LDL CALC: 134 mg/dL — AB (ref 0–99)
TRIGLYCERIDES: 184 mg/dL — AB (ref 0–149)
VLDL Cholesterol Cal: 37 mg/dL (ref 5–40)

## 2015-03-10 LAB — VITAMIN D 25 HYDROXY (VIT D DEFICIENCY, FRACTURES): VIT D 25 HYDROXY: 14.7 ng/mL — AB (ref 30.0–100.0)

## 2015-03-10 MED ORDER — VITAMIN D (ERGOCALCIFEROL) 1.25 MG (50000 UNIT) PO CAPS: 50000.0000 [IU] | ORAL_CAPSULE | ORAL | Status: DC

## 2015-03-10 NOTE — Telephone Encounter (Signed)
I spoke with [name] about the LDCT screening program. She lives in [place], and it is much more convenient for her to be seen at the [facility]. I have referred her to [facility] via staff message to pick her up for the shared decision making visit and to schedule the scan.She asked to be called next week if possible to schedule.I have passed this information on to [place].She has my contact information should she have any additional questions.

## 2015-03-14 ENCOUNTER — Encounter: Payer: Self-pay | Admitting: Internal Medicine

## 2015-03-14 LAB — PAP LB (LIQUID-BASED): PAP Smear Comment: 0

## 2015-03-16 ENCOUNTER — Ambulatory Visit (INDEPENDENT_AMBULATORY_CARE_PROVIDER_SITE_OTHER): Payer: Managed Care, Other (non HMO) | Admitting: Internal Medicine

## 2015-03-16 ENCOUNTER — Encounter: Payer: Self-pay | Admitting: Internal Medicine

## 2015-03-16 VITALS — BP 126/70 | HR 70 | Temp 98.4°F | Wt 120.0 lb

## 2015-03-16 DIAGNOSIS — H00019 Hordeolum externum unspecified eye, unspecified eyelid: Secondary | ICD-10-CM | POA: Diagnosis not present

## 2015-03-16 NOTE — Progress Notes (Signed)
Subjective:    Patient ID: Pamela Haley, female    DOB: 30-Jun-1951, 64 y.o.   MRN: [id number]  HPI  Pt presents to the clinic today with c/o a stye to her right upper lid and left lower lid. This started 1 week ago. She feels like they are getting a little bigger. She is not having any pain or trouble with her vision. She has used OTC Stye medication and warm compresses with some relief.  Review of Systems      Past Medical History  Diagnosis Date  . Abnormal EKG     HX OF PREVIOUS EKGS SHOWING POSSIBLE MI--BUT PT HAS NEVER HAD ANY HEART PROBLEMS--HAD BACK SURGERY AUG 2012 AT [facility] - NO HEART PROBLEMS  . Chronic cough     PT IS A SMOKER  . DDD (degenerative disc disease), lumbar     PAIN IN LOWER BACK AND DOWN RT HIP AND LEG--NUMBNESS, THROBBING, BURNING  . Hyperthyroidism 02/20/2015    Current Outpatient Prescriptions  Medication Sig Dispense Refill  . Ascorbic Acid (VITAMIN C) 1000 MG tablet Take 1,000 mg by mouth daily.    . Biotin 10 MG TABS Take 1 tablet by mouth daily.    . cyclobenzaprine (FLEXERIL) 10 MG tablet Take 10 mg by mouth as needed for muscle spasms.    . Multiple Vitamins-Minerals (HAIR/SKIN/NAILS PO) Take 1 capsule by mouth daily.    . Vitamin D, Ergocalciferol, (DRISDOL) 50000 UNITS CAPS capsule Take 1 capsule (50,000 Units total) by mouth every 7 (seven) days. 12 capsule 0   No current facility-administered medications for this visit.    No Known Allergies  Family History  Problem Relation Age of Onset  . Heart disease Mother   . Heart disease Father   . Stroke Maternal Grandmother   . Diabetes Maternal Grandmother     History   Social History  . Marital Status: Single    Spouse Name: N/A  . Number of Children: N/A  . Years of Education: N/A   Occupational History  . Not on file.   Social History Main Topics  . Smoking status: Current Every Day Smoker -- 1.00 packs/day for 30 years    Types: Cigarettes  . Smokeless tobacco: Never Used    . Alcohol Use: 0.0 oz/week    0 Standard drinks or equivalent per week     Comment: rare  . Drug Use: No  . Sexual Activity: Not Currently   Other Topics Concern  . Not on file   Social History Narrative     Constitutional: Denies fever, malaise, fatigue, headache or abrupt weight changes.  HEENT: Pt reports stye. Denies eye pain, eye redness, ear pain, ringing in the ears, wax buildup, runny nose, nasal congestion, bloody nose, or sore throat. Respiratory: Denies difficulty breathing, shortness of breath, cough or sputum production.   Cardiovascular: Denies chest pain, chest tightness, palpitations or swelling in the hands or feet.   No other specific complaints in a complete review of systems (except as listed in HPI above).  Objective:   Physical Exam   BP 126/70 mmHg  Pulse 70  Temp(Src) 98.4 F (36.9 C) (Oral)  Wt 120 lb (54.432 kg)  SpO2 96% Wt Readings from Last 3 Encounters:  03/16/15 120 lb (54.432 kg)  03/09/15 121 lb 4 oz (54.999 kg)  02/20/15 120 lb (54.432 kg)    General: Appears her stated age, well developed, well nourished in NAD. HEENT: Head: normal shape and size; Eyes: sclera  white, no icterus, conjunctiva pink, PERRLA and EOMs intact; Small stye to left lower lid and right upper lid. Cardiovascular: Normal rate and rhythm. S1,S2 noted.  No murmur, rubs or gallops noted.  Pulmonary/Chest: Normal effort and positive vesicular breath sounds. No respiratory distress. No wheezes, rales or ronchi noted.   BMET    Component Value Date/Time   NA 143 03/09/2015 1529   NA 136 05/15/2012 1525   K 4.1 03/09/2015 1529   CL 99 03/09/2015 1529   CO2 25 03/09/2015 1529   GLUCOSE 71 03/09/2015 1529   GLUCOSE 85 05/15/2012 1525   BUN 9 03/09/2015 1529   BUN 10 05/15/2012 1525   CREATININE 0.74 03/09/2015 1529   CALCIUM 9.4 03/09/2015 1529   GFRNONAA 86 03/09/2015 1529   GFRAA 100 03/09/2015 1529    Lipid Panel     Component Value Date/Time   CHOL 217*  03/09/2015 1529   TRIG 184* 03/09/2015 1529   HDL 46 03/09/2015 1529   CHOLHDL 4.7* 03/09/2015 1529   LDLCALC 134* 03/09/2015 1529    CBC    Component Value Date/Time   WBC 9.5 03/09/2015 1529   WBC 9.5 05/15/2012 1525   RBC 5.24 03/09/2015 1529   RBC 5.13* 05/15/2012 1525   HGB 16.1* 05/15/2012 1525   HCT 48.8* 03/09/2015 1529   HCT 46.4* 05/15/2012 1525   PLT 235 05/15/2012 1525   MCV 90.4 05/15/2012 1525   MCH 30.9 03/09/2015 1529   MCH 31.4 05/15/2012 1525   MCHC 33.2 03/09/2015 1529   MCHC 34.7 05/15/2012 1525   RDW 13.2 03/09/2015 1529   RDW 12.6 05/15/2012 1525   LYMPHSABS 4.0 05/15/2012 1525   MONOABS 0.8 05/15/2012 1525   EOSABS 0.3 05/15/2012 1525   BASOSABS 0.1 05/15/2012 1525    Hgb A1C No results found for: HGBA1C      Assessment & Plan:   Stye:  Advised her to continue warm compresses TID Discussed why antibiotics are not used in these cases Ibuprofen as needed for inflammation She will follow up with an eye doctor as needed if persist or worsens  RTC as needed

## 2015-03-16 NOTE — Progress Notes (Signed)
Pre visit review using our clinic review tool, if applicable. No additional management support is needed unless otherwise documented below in the visit note. 

## 2015-03-16 NOTE — Patient Instructions (Signed)

## 2015-08-28 ENCOUNTER — Ambulatory Visit (INDEPENDENT_AMBULATORY_CARE_PROVIDER_SITE_OTHER): Payer: Managed Care, Other (non HMO) | Admitting: Internal Medicine

## 2015-08-28 ENCOUNTER — Encounter: Payer: Self-pay | Admitting: Internal Medicine

## 2015-08-28 VITALS — BP 140/80 | HR 110 | Temp 97.8°F | Wt 118.0 lb

## 2015-08-28 DIAGNOSIS — J209 Acute bronchitis, unspecified: Secondary | ICD-10-CM | POA: Diagnosis not present

## 2015-08-28 NOTE — Progress Notes (Signed)
Subjective:    Patient ID: Pamela Haley, female    DOB: Jul 24, 1951, 64 y.o.   MRN: [id number]  HPI Here due to cough  Has had a fever over the past couple of days Felt hot/with chills Fever seems better today Bad cough---ribs are sore from this. May have started 3 days ago Cough is dry--rattling around Not SOB  Slight scratchy throat Some nasal congestion but not much rhinorrhea No ear pain  Taking mucinex--some help for cough Tylenol for fever--not clearly helpful  Still a smoker--did stop 3 days ago with illness  Current Outpatient Prescriptions on File Prior to Visit  Medication Sig Dispense Refill  . Ascorbic Acid (VITAMIN C) 1000 MG tablet Take 1,000 mg by mouth daily.    . Biotin 10 MG TABS Take 1 tablet by mouth daily.    . cyclobenzaprine (FLEXERIL) 10 MG tablet Take 10 mg by mouth as needed for muscle spasms.    . Multiple Vitamins-Minerals (HAIR/SKIN/NAILS PO) Take 1 capsule by mouth daily.    . Vitamin D, Ergocalciferol, (DRISDOL) 50000 UNITS CAPS capsule Take 1 capsule (50,000 Units total) by mouth every 7 (seven) days. 12 capsule 0   No current facility-administered medications on file prior to visit.    No Known Allergies  Past Medical History  Diagnosis Date  . Abnormal EKG     HX OF PREVIOUS EKGS SHOWING POSSIBLE MI--BUT PT HAS NEVER HAD ANY HEART PROBLEMS--HAD BACK SURGERY AUG 2012 AT [facility] - NO HEART PROBLEMS  . Chronic cough     PT IS A SMOKER  . DDD (degenerative disc disease), lumbar     PAIN IN LOWER BACK AND DOWN RT HIP AND LEG--NUMBNESS, THROBBING, BURNING  . Hyperthyroidism 02/20/2015    Past Surgical History  Procedure Laterality Date  . Back surgery  05/2011    lumbar surgery at [facility]  . C sections x 3    . Bilateral carpal tunnel release      Family History  Problem Relation Age of Onset  . Heart disease Mother   . Heart disease Father   . Stroke Maternal Grandmother   . Diabetes Maternal Grandmother     Social History    Social History  . Marital Status: Single    Spouse Name: N/A  . Number of Children: N/A  . Years of Education: N/A   Occupational History  . Not on file.   Social History Main Topics  . Smoking status: Current Every Day Smoker -- 1.00 packs/day for 30 years    Types: Cigarettes  . Smokeless tobacco: Never Used  . Alcohol Use: 0.0 oz/week    0 Standard drinks or equivalent per week     Comment: rare  . Drug Use: No  . Sexual Activity: Not Currently   Other Topics Concern  . Not on file   Social History Narrative   Review of Systems Has had muscle aches No vomiting or diarrhea---but hard to eat (cough after eating then regurgitation) No rash    Objective:   Physical Exam  Constitutional: She appears well-developed. No distress.  HENT:  Mouth/Throat: Oropharynx is clear and moist.  Mild maxillary tenderness TMs normal Mild nasal inflammation  Neck: Normal range of motion. Neck supple.  Pulmonary/Chest: Effort normal. No respiratory distress. She has no rales.  Very slight expiratory phase prolongation and slight exp wheeze  Lymphadenopathy:    She has no cervical adenopathy.          Assessment & Plan:

## 2015-08-28 NOTE — Progress Notes (Signed)
Pre visit review using our clinic review tool, if applicable. No additional management support is needed unless otherwise documented below in the visit note. 

## 2015-08-28 NOTE — Assessment & Plan Note (Signed)
Seems to be viral (and perhaps very mild flu with fever and myalgias) Discussed supportive care If worsens later in week-- will try empiric doxy Discussed staying off cigarettes

## 2015-08-28 NOTE — Patient Instructions (Signed)
Call later in the week if you are getting worse.  Smoking Cessation, Tips for Success If you are ready to quit smoking, congratulations! You have chosen to help yourself be healthier. Cigarettes bring nicotine, tar, carbon monoxide, and other irritants into your body. Your lungs, heart, and blood vessels will be able to work better without these poisons. There are many different ways to quit smoking. Nicotine gum, nicotine patches, a nicotine inhaler, or nicotine nasal spray can help with physical craving. Hypnosis, support groups, and medicines help break the habit of smoking. WHAT THINGS CAN I DO TO MAKE QUITTING EASIER?  Here are some tips to help you quit for good:  Pick a date when you will quit smoking completely. Tell all of your friends and family about your plan to quit on that date.  Do not try to slowly cut down on the number of cigarettes you are smoking. Pick a quit date and quit smoking completely starting on that day.  Throw away all cigarettes.   Clean and remove all ashtrays from your home, work, and car.  On a card, write down your reasons for quitting. Carry the card with you and read it when you get the urge to smoke.  Cleanse your body of nicotine. Drink enough water and fluids to keep your urine clear or pale yellow. Do this after quitting to flush the nicotine from your body.  Learn to predict your moods. Do not let a bad situation be your excuse to have a cigarette. Some situations in your life might tempt you into wanting a cigarette.  Never have "just one" cigarette. It leads to wanting another and another. Remind yourself of your decision to quit.  Change habits associated with smoking. If you smoked while driving or when feeling stressed, try other activities to replace smoking. Stand up when drinking your coffee. Brush your teeth after eating. Sit in a different chair when you read the paper. Avoid alcohol while trying to quit, and try to drink fewer caffeinated  beverages. Alcohol and caffeine may urge you to smoke.  Avoid foods and drinks that can trigger a desire to smoke, such as sugary or spicy foods and alcohol.  Ask people who smoke not to smoke around you.  Have something planned to do right after eating or having a cup of coffee. For example, plan to take a walk or exercise.  Try a relaxation exercise to calm you down and decrease your stress. Remember, you may be tense and nervous for the first 2 weeks after you quit, but this will pass.  Find new activities to keep your hands busy. Play with a pen, coin, or rubber band. Doodle or draw things on paper.  Brush your teeth right after eating. This will help cut down on the craving for the taste of tobacco after meals. You can also try mouthwash.   Use oral substitutes in place of cigarettes. Try using lemon drops, carrots, cinnamon sticks, or chewing gum. Keep them handy so they are available when you have the urge to smoke.  When you have the urge to smoke, try deep breathing.  Designate your home as a nonsmoking area.  If you are a heavy smoker, ask your health care provider about a prescription for nicotine chewing gum. It can ease your withdrawal from nicotine.  Reward yourself. Set aside the cigarette money you save and buy yourself something nice.  Look for support from others. Join a support group or smoking cessation program. Ask someone  at home or at work to help you with your plan to quit smoking.  Always ask yourself, "Do I need this cigarette or is this just a reflex?" Tell yourself, "Today, I choose not to smoke," or "I do not want to smoke." You are reminding yourself of your decision to quit.  Do not replace cigarette smoking with electronic cigarettes (commonly called e-cigarettes). The safety of e-cigarettes is unknown, and some may contain harmful chemicals.  If you relapse, do not give up! Plan ahead and think about what you will do the next time you get the urge to  smoke. HOW WILL I FEEL WHEN I QUIT SMOKING? You may have symptoms of withdrawal because your body is used to nicotine (the addictive substance in cigarettes). You may crave cigarettes, be irritable, feel very hungry, cough often, get headaches, or have difficulty concentrating. The withdrawal symptoms are only temporary. They are strongest when you first quit but will go away within 10-14 days. When withdrawal symptoms occur, stay in control. Think about your reasons for quitting. Remind yourself that these are signs that your body is healing and getting used to being without cigarettes. Remember that withdrawal symptoms are easier to treat than the major diseases that smoking can cause.  Even after the withdrawal is over, expect periodic urges to smoke. However, these cravings are generally short lived and will go away whether you smoke or not. Do not smoke! WHAT RESOURCES ARE AVAILABLE TO HELP ME QUIT SMOKING? Your health care provider can direct you to community resources or hospitals for support, which may include:  Group support.  Education.  Hypnosis.  Therapy.   This information is not intended to replace advice given to you by your health care provider. Make sure you discuss any questions you have with your health care provider.   Document Released: 07/12/2004 Document Revised: 11/04/2014 Document Reviewed: 04/01/2013 Elsevier Interactive Patient Education [employer].

## 2015-08-31 ENCOUNTER — Telehealth: Payer: Self-pay

## 2015-08-31 MED ORDER — DOXYCYCLINE HYCLATE 100 MG PO TABS: 100.0000 mg | ORAL_TABLET | Freq: Two times a day (BID) | ORAL | Status: DC

## 2015-08-31 NOTE — Telephone Encounter (Signed)
Please let her know that I sent the antibiotic prescription for her

## 2015-08-31 NOTE — Telephone Encounter (Signed)
Left message on voicemail.

## 2015-08-31 NOTE — Telephone Encounter (Signed)
Pt left v/m; pt was seen 08/28/15; pt has prod cough with green phlegm, feels like concrete in chest and sinus feels like in vice. When lays down to sleep has a lot of drainage and periods of coughing. [facility] [address]. Pt request cb. Per 08/28/15 note may try abx.Please advise.

## 2015-09-25 ENCOUNTER — Telehealth: Payer: Self-pay

## 2015-09-25 MED ORDER — CYCLOBENZAPRINE HCL 10 MG PO TABS: 10.0000 mg | ORAL_TABLET | ORAL | Status: DC | PRN

## 2015-09-25 NOTE — Telephone Encounter (Signed)
Pt left v/m; pt has had previous back surgery; pt requesting cyclobenzaprine refill to [facility] [address]. Pt has one pill left. Do not see where Avie Echevaria NP has filled before. Last annual was 03/09/15.

## 2015-09-27 MED ORDER — CYCLOBENZAPRINE HCL 10 MG PO TABS: 10.0000 mg | ORAL_TABLET | Freq: Three times a day (TID) | ORAL | Status: DC | PRN

## 2015-09-27 NOTE — Telephone Encounter (Signed)
TID prn

## 2015-09-27 NOTE — Telephone Encounter (Signed)
Pamela Haley with [facility] [address] left v/m requesting cb with clarification of max per day pt can take the cyclobenzaprine.

## 2015-09-27 NOTE — Telephone Encounter (Signed)
Rx resent with instructions below

## 2015-09-27 NOTE — Addendum Note (Signed)
Addended by: Lurlean Nanny on: 09/27/2015 04:48 PM   Modules accepted: Orders

## 2015-12-18 ENCOUNTER — Telehealth: Payer: Self-pay | Admitting: Internal Medicine

## 2015-12-18 NOTE — Telephone Encounter (Incomplete)
[facility]  Patient Name: Pamela Haley  DOB: [birth year]    Initial Comment Caller states she is with the office and is transferring a patient who cannot come in for an appt due to no transportation***** Caller is Kimira Colonna - 1951/09/24 @ [phone] - Caller states she has terrible body aches. The caller has 100.6 temp. The caller has had chills and body aches. The caller has a slight cough.    Nurse Assessment  Nurse: Wynetta Emery, RN, Baker Janus Date/Time Eilene Ghazi Time): 12/18/2015 9:32:11 AM  Confirm and document reason for call. If symptomatic, describe symptoms. You must click the next button to save text entered. ---Edris states sick since Friday -- body aches fever and chills 100.6 slight cough  Has the patient traveled out of the country within the last 30 days? ---No  Does the patient have any new or worsening symptoms? ---Yes  Will a triage be completed? ---Yes  Related visit to physician within the last 2 weeks? ---No  Does the PT have any chronic conditions? (i.e. diabetes, asthma, etc.) ---No  Is this a behavioral health or substance abuse call? ---No     Guidelines    Guideline Title Affirmed Question Affirmed Notes  Influenza - Seasonal Patient is HIGH RISK (e.g., age > 32 years, pregnant, HIV+, or chronic medical condition)    Final Disposition User   Call PCP Now Wynetta Emery, RN, Baker Janus    Comments    NOTE: States she does not feel stable enough to drive anywhere to get seen advised she would need to be seen before most MD's would call something in for her   Referrals  GO TO FACILITY REFUSED   Disagree/Comply: Comply

## 2015-12-18 NOTE — Telephone Encounter (Signed)
She needs an appt, she may have the flu

## 2015-12-18 NOTE — Telephone Encounter (Signed)
Pt states she cannot make it in the office---advised pt the only way to rule out flu is to be tested and be evaluated as there are a lot of viruses out and it may not be flu--pt expressed understanding

## 2016-01-25 ENCOUNTER — Ambulatory Visit (INDEPENDENT_AMBULATORY_CARE_PROVIDER_SITE_OTHER): Payer: Managed Care, Other (non HMO) | Admitting: Internal Medicine

## 2016-01-25 ENCOUNTER — Encounter: Payer: Self-pay | Admitting: Internal Medicine

## 2016-01-25 VITALS — BP 120/80 | HR 91 | Temp 98.8°F | Wt 120.0 lb

## 2016-01-25 DIAGNOSIS — R059 Cough, unspecified: Secondary | ICD-10-CM

## 2016-01-25 DIAGNOSIS — Z720 Tobacco use: Secondary | ICD-10-CM

## 2016-01-25 DIAGNOSIS — R05 Cough: Secondary | ICD-10-CM

## 2016-01-25 DIAGNOSIS — J410 Simple chronic bronchitis: Secondary | ICD-10-CM

## 2016-01-25 DIAGNOSIS — F17200 Nicotine dependence, unspecified, uncomplicated: Secondary | ICD-10-CM

## 2016-01-25 DIAGNOSIS — J449 Chronic obstructive pulmonary disease, unspecified: Secondary | ICD-10-CM | POA: Insufficient documentation

## 2016-01-25 MED ORDER — BENZONATATE 200 MG PO CAPS: 200.0000 mg | ORAL_CAPSULE | Freq: Three times a day (TID) | ORAL | Status: DC | PRN

## 2016-01-25 NOTE — Progress Notes (Signed)
Pre visit review using our clinic review tool, if applicable. No additional management support is needed unless otherwise documented below in the visit note. 

## 2016-01-25 NOTE — Progress Notes (Signed)
HPI  Pt presents to the clinic today with c/o cough. This started 2 weeks ago. The cough is productive of clear/white mucous. She does have an associated enlarged lymph node in her neck. She denies runny nose, ear pain, difficulty swallowing or shortness of breath. She denies fever, chills or body aches. She has not tried anything OTC. She reports she did have the flu 1 month ago, called [facility] and was treated with Prednisone and an Albuterol inhaler. She does have a history of COPD. She does smoke.  Review of Systems      Past Medical History  Diagnosis Date  . Abnormal EKG     HX OF PREVIOUS EKGS SHOWING POSSIBLE MI--BUT PT HAS NEVER HAD ANY HEART PROBLEMS--HAD BACK SURGERY AUG 2012 AT [facility] - NO HEART PROBLEMS  . Chronic cough     PT IS A SMOKER  . DDD (degenerative disc disease), lumbar     PAIN IN LOWER BACK AND DOWN RT HIP AND LEG--NUMBNESS, THROBBING, BURNING  . Hyperthyroidism 02/20/2015    Family History  Problem Relation Age of Onset  . Heart disease Mother   . Heart disease Father   . Stroke Maternal Grandmother   . Diabetes Maternal Grandmother     Social History   Social History  . Marital Status: Single    Spouse Name: N/A  . Number of Children: N/A  . Years of Education: N/A   Occupational History  . Not on file.   Social History Main Topics  . Smoking status: Current Every Day Smoker -- 1.00 packs/day for 30 years    Types: Cigarettes  . Smokeless tobacco: Never Used  . Alcohol Use: 0.0 oz/week    0 Standard drinks or equivalent per week     Comment: rare  . Drug Use: No  . Sexual Activity: Not Currently   Other Topics Concern  . Not on file   Social History Narrative    No Known Allergies   Constitutional: Denies headache, fatigue, fever or abrupt weight changes.  HEENT:  Denies eye redness, eye pain, pressure behind the eyes, facial pain, nasal congestion, ear pain, ringing in the ears, wax buildup, runny nose or sore throat. Respiratory:  Positive cough. Denies difficulty breathing or shortness of breath.  Cardiovascular: Denies chest pain, chest tightness, palpitations or swelling in the hands or feet.   No other specific complaints in a complete review of systems (except as listed in HPI above).  Objective:   BP 120/80 mmHg  Pulse 91  Temp(Src) 98.8 F (37.1 C) (Oral)  Wt 120 lb (54.432 kg)  SpO2 93% Wt Readings from Last 3 Encounters:  01/25/16 120 lb (54.432 kg)  08/28/15 118 lb (53.524 kg)  03/16/15 120 lb (54.432 kg)     General: Appears her stated age, in NAD. HEENT: Head: normal shape and size, no sinus tenderness noted; Eyes: sclera white, no icterus, conjunctiva pink; Ears: Tm's gray and intact, normal light reflex; Nose: mucosa pink and moist, septum midline; Throat/Mouth: + PND. Teeth present, mucosa pink and moist, no exudate noted, no lesions or ulcerations noted.  Neck: Cervical lymphadenopathy noted on the left.  Cardiovascular: Normal rate and rhythm. S1,S2 noted.  No murmur, rubs or gallops noted.  Pulmonary/Chest: Normal effort and coarse vesicular breath sounds. No respiratory distress. No wheezes, rales or ronchi noted.      Assessment & Plan:   Cough:  Post infectious versus smokers cough Discussed smoking cessation, approx 5 minutes Try Mucinex 600 mg  BID x 3 days eRx for Benzonate 200 mg TID prn   RTC as needed or if symptoms persist.

## 2016-01-25 NOTE — Patient Instructions (Signed)

## 2016-03-08 ENCOUNTER — Other Ambulatory Visit: Payer: Self-pay

## 2016-03-08 MED ORDER — CYCLOBENZAPRINE HCL 10 MG PO TABS: 10.0000 mg | ORAL_TABLET | Freq: Three times a day (TID) | ORAL | Status: DC | PRN

## 2016-03-08 NOTE — Telephone Encounter (Signed)
Sent electronically. Needs to schedule physical/follow up

## 2016-03-08 NOTE — Telephone Encounter (Signed)
Pt left v/m requesting refill flexeril to [facility] [address]. Pt takes flexeril prn since had back surgery. Pt request cb. Last refilled # 30 on 09/27/15; last annual exam on 03/09/15. No future appt scheduled.

## 2016-03-27 NOTE — Telephone Encounter (Signed)
CPE reminder letter mailed to pt  

## 2016-09-05 ENCOUNTER — Other Ambulatory Visit: Payer: Self-pay

## 2016-09-05 MED ORDER — CYCLOBENZAPRINE HCL 10 MG PO TABS: 10.0000 mg | ORAL_TABLET | Freq: Three times a day (TID) | ORAL | 0 refills | Status: DC | PRN

## 2016-09-05 NOTE — Telephone Encounter (Signed)
Rx sent through e-scribe  

## 2016-10-01 ENCOUNTER — Ambulatory Visit (INDEPENDENT_AMBULATORY_CARE_PROVIDER_SITE_OTHER): Payer: Managed Care, Other (non HMO) | Admitting: Internal Medicine

## 2016-10-01 ENCOUNTER — Encounter: Payer: Self-pay | Admitting: Internal Medicine

## 2016-10-01 VITALS — BP 124/78 | HR 106 | Temp 98.7°F | Ht 64.0 in | Wt 119.0 lb

## 2016-10-01 DIAGNOSIS — R946 Abnormal results of thyroid function studies: Secondary | ICD-10-CM

## 2016-10-01 DIAGNOSIS — Z0000 Encounter for general adult medical examination without abnormal findings: Secondary | ICD-10-CM | POA: Diagnosis not present

## 2016-10-01 DIAGNOSIS — Z1159 Encounter for screening for other viral diseases: Secondary | ICD-10-CM

## 2016-10-01 DIAGNOSIS — Z114 Encounter for screening for human immunodeficiency virus [HIV]: Secondary | ICD-10-CM

## 2016-10-01 DIAGNOSIS — Z0001 Encounter for general adult medical examination with abnormal findings: Secondary | ICD-10-CM

## 2016-10-01 DIAGNOSIS — R7989 Other specified abnormal findings of blood chemistry: Secondary | ICD-10-CM

## 2016-10-01 NOTE — Patient Instructions (Signed)

## 2016-10-01 NOTE — Progress Notes (Addendum)
Subjective:    Patient ID: Pamela Haley, female    DOB: 1951-06-19, 65 y.o.   MRN: [id number]  HPI  Pt presents to the clinic today for her annual exam. She reports her sister wanted her to mention to me, that she thinks Mrs. Barfield is depressed. The pt reports she is not depressed, she just prefers to be at home and not go out in public unless needed. She has no history of social anxiety. She does not lay in bed all day, stay in or pajamas or not take a shower. She does not have crying spells or feel down, depressed or hopeless. She reports she is just not a people person.   Flu: 08/2016 Tetanus: unsure Pneumovax: never Prevnar: never Zostovax: never Pap Smear" 02/2015- normal Mammogram: 2011 Colon Screening: never Bone Density: never Vision Screening: never Dentist: Biannually  Diet: She does eat meat. She consumes fruits and veggies daily. She occasionally eats fried foods. She drinks mostly Coke. Exercise: None  Review of Systems  Past Medical History:  Diagnosis Date  . Abnormal EKG    HX OF PREVIOUS EKGS SHOWING POSSIBLE MI--BUT PT HAS NEVER HAD ANY HEART PROBLEMS--HAD BACK SURGERY AUG 2012 AT [facility] - NO HEART PROBLEMS  . Chronic cough    PT IS A SMOKER  . DDD (degenerative disc disease), lumbar    PAIN IN LOWER BACK AND DOWN RT HIP AND LEG--NUMBNESS, THROBBING, BURNING  . Hyperthyroidism 02/20/2015    Current Outpatient Prescriptions  Medication Sig Dispense Refill  . Ascorbic Acid (VITAMIN C) 1000 MG tablet Take 1,000 mg by mouth daily.    . Biotin 10 MG TABS Take 1 tablet by mouth daily.    . cyclobenzaprine (FLEXERIL) 10 MG tablet Take 1 tablet (10 mg total) by mouth 3 (three) times daily as needed for muscle spasms. 30 tablet 0  . Multiple Vitamins-Minerals (HAIR/SKIN/NAILS PO) Take 1 capsule by mouth daily.     No current facility-administered medications for this visit.     No Known Allergies  Family History  Problem Relation Age of Onset  . Heart  disease Mother   . Heart disease Father   . Stroke Maternal Grandmother   . Diabetes Maternal Grandmother     Social History   Social History  . Marital status: Single    Spouse name: N/A  . Number of children: N/A  . Years of education: N/A   Occupational History  . Not on file.   Social History Main Topics  . Smoking status: Current Every Day Smoker    Packs/day: 1.00    Years: 30.00    Types: Cigarettes  . Smokeless tobacco: Never Used  . Alcohol use 0.0 oz/week     Comment: rare  . Drug use: No  . Sexual activity: Not Currently   Other Topics Concern  . Not on file   Social History Narrative  . No narrative on file     Constitutional: Denies fever, malaise, fatigue, headache or abrupt weight changes.  HEENT: Denies eye pain, eye redness, ear pain, ringing in the ears, wax buildup, runny nose, nasal congestion, bloody nose, or sore throat. Respiratory: Pt reports cough. Denies difficulty breathing, shortness of breath, or sputum production.   Cardiovascular: Denies chest pain, chest tightness, palpitations or swelling in the hands or feet.  Gastrointestinal: Denies abdominal pain, bloating, constipation, diarrhea or blood in the stool.  GU: Denies urgency, frequency, pain with urination, burning sensation, blood in urine, odor or discharge. Musculoskeletal:  Denies decrease in range of motion, difficulty with gait, muscle pain or joint pain and swelling.  Skin: Denies redness, rashes, lesions or ulcercations.  Neurological: Denies dizziness, difficulty with memory, difficulty with speech or problems with balance and coordination.  Psych: Denies anxiety, depression, SI/HI.  No other specific complaints in a complete review of systems (except as listed in HPI above).     Objective:   Physical Exam   BP 124/78   Pulse (!) 106   Temp 98.7 F (37.1 C) (Oral)   Ht 5\' 4"  (1.626 m)   Wt 119 lb (54 kg)   SpO2 95%   BMI 20.43 kg/m  Wt Readings from Last 3  Encounters:  10/01/16 119 lb (54 kg)  01/25/16 120 lb (54.4 kg)  08/28/15 118 lb (53.5 kg)    General: Appears her stated age, well developed, well nourished in NAD. Skin: Warm, dry and intact.  HEENT: Head: normal shape and size; Eyes: sclera white, no icterus, conjunctiva pink, PERRLA and EOMs intact; Ears: Tm's gray and intact, normal light reflex;  Throat/Mouth: Teeth present, mucosa pink and moist, no exudate, lesions or ulcerations noted.  Neck:  Neck supple, trachea midline. No masses, lumps or thyromegaly present.  Cardiovascular: Normal rate and rhythm. S1,S2 noted.  No murmur, rubs or gallops noted. No JVD or BLE edema. No carotid bruits noted. Pulmonary/Chest: Normal effort and decreased vesicular breath sounds. No respiratory distress. No wheezes, rales or ronchi noted.  Abdomen: Soft and nontender. Normal bowel sounds. No distention or masses noted. Liver, spleen and kidneys non palpable. Musculoskeletal: Strength 5/5 BUE/BLE. No difficulty with gait.  Neurological: Alert and oriented. Cranial nerves II-XII grossly intact. Coordination normal.  Psychiatric: Mood and affect normal. Behavior is normal. Judgment and thought content normal.    BMET    Component Value Date/Time   NA 143 03/09/2015 1529   K 4.1 03/09/2015 1529   CL 99 03/09/2015 1529   CO2 25 03/09/2015 1529   GLUCOSE 71 03/09/2015 1529   GLUCOSE 85 05/15/2012 1525   BUN 9 03/09/2015 1529   CREATININE 0.74 03/09/2015 1529   CALCIUM 9.4 03/09/2015 1529   GFRNONAA 86 03/09/2015 1529   GFRAA 100 03/09/2015 1529    Lipid Panel     Component Value Date/Time   CHOL 217 (H) 03/09/2015 1529   TRIG 184 (H) 03/09/2015 1529   HDL 46 03/09/2015 1529   CHOLHDL 4.7 (H) 03/09/2015 1529   LDLCALC 134 (H) 03/09/2015 1529    CBC    Component Value Date/Time   WBC 9.5 03/09/2015 1529   WBC 9.5 05/15/2012 1525   RBC 5.24 03/09/2015 1529   RBC 5.13 (H) 05/15/2012 1525   HGB 16.1 (H) 05/15/2012 1525   HCT 48.8  (H) 03/09/2015 1529   PLT 249 03/09/2015 1529   MCV 93 03/09/2015 1529   MCH 30.9 03/09/2015 1529   MCH 31.4 05/15/2012 1525   MCHC 33.2 03/09/2015 1529   MCHC 34.7 05/15/2012 1525   RDW 13.2 03/09/2015 1529   LYMPHSABS 4.0 05/15/2012 1525   MONOABS 0.8 05/15/2012 1525   EOSABS 0.3 05/15/2012 1525   BASOSABS 0.1 05/15/2012 1525    Hgb A1C No results found for: HGBA1C      Assessment & Plan:   Preventative Health Maintenance:  Flu shot UTD She declines tetanus, pneumovax, prevnar, zostovax Pap smear UTD She declines mammogram or bone density screening She declines colonoscopy but Cologuard ordered Encouraged her to consume a balanced diet and exercise  regimen Advised her to see an eye doctor and dentist annuallly Will check CBC, CMET, Lipid, TSH, Vit D, Hep C and HIV today  RTC in 1 year, sooner if needed Webb Silversmith, NP

## 2016-10-02 LAB — LIPID PANEL
CHOL/HDL RATIO: 5.1 ratio — AB (ref 0.0–4.4)
CHOLESTEROL TOTAL: 239 mg/dL — AB (ref 100–199)
HDL: 47 mg/dL (ref >39)
LDL CALC: 173 mg/dL — AB (ref 0–99)
TRIGLYCERIDES: 95 mg/dL (ref 0–149)
VLDL CHOLESTEROL CAL: 19 mg/dL (ref 5–40)

## 2016-10-02 LAB — CBC
HEMATOCRIT: 50.8 % — AB (ref 34.0–46.6)
Hemoglobin: 17.7 g/dL — ABNORMAL HIGH (ref 11.1–15.9)
MCH: 32.5 pg (ref 26.6–33.0)
MCHC: 34.8 g/dL (ref 31.5–35.7)
MCV: 93 fL (ref 79–97)
Platelets: 275 10*3/uL (ref 150–379)
RBC: 5.45 x10E6/uL — AB (ref 3.77–5.28)
RDW: 13 % (ref 12.3–15.4)
WBC: 11.3 10*3/uL — AB (ref 3.4–10.8)

## 2016-10-02 LAB — COMPREHENSIVE METABOLIC PANEL
A/G RATIO: 1.6 (ref 1.2–2.2)
ALT: 13 IU/L (ref 0–32)
AST: 13 IU/L (ref 0–40)
Albumin: 4.4 g/dL (ref 3.6–4.8)
Alkaline Phosphatase: 83 IU/L (ref 39–117)
BUN/Creatinine Ratio: 16 (ref 12–28)
BUN: 12 mg/dL (ref 8–27)
Bilirubin Total: 0.4 mg/dL (ref 0.0–1.2)
CALCIUM: 9.8 mg/dL (ref 8.7–10.3)
CO2: 27 mmol/L (ref 18–29)
CREATININE: 0.75 mg/dL (ref 0.57–1.00)
Chloride: 98 mmol/L (ref 96–106)
GFR, EST AFRICAN AMERICAN: 97 mL/min/{1.73_m2} (ref >59)
GFR, EST NON AFRICAN AMERICAN: 84 mL/min/{1.73_m2} (ref >59)
GLOBULIN, TOTAL: 2.8 g/dL (ref 1.5–4.5)
Glucose: 89 mg/dL (ref 65–99)
POTASSIUM: 4.6 mmol/L (ref 3.5–5.2)
SODIUM: 142 mmol/L (ref 134–144)
TOTAL PROTEIN: 7.2 g/dL (ref 6.0–8.5)

## 2016-10-02 LAB — HEMOGLOBIN A1C
Est. average glucose Bld gHb Est-mCnc: 123 mg/dL
Hgb A1c MFr Bld: 5.9 % — ABNORMAL HIGH (ref 4.8–5.6)

## 2016-10-02 LAB — VITAMIN D 25 HYDROXY (VIT D DEFICIENCY, FRACTURES): Vit D, 25-Hydroxy: 25.5 ng/mL — ABNORMAL LOW (ref 30.0–100.0)

## 2016-10-02 LAB — HEPATITIS C ANTIBODY: Hep C Virus Ab: <0.1 s/co ratio (ref 0.0–0.9)

## 2016-10-02 LAB — HIV ANTIBODY (ROUTINE TESTING W REFLEX): HIV Screen 4th Generation wRfx: NONREACTIVE

## 2016-10-02 LAB — TSH: TSH: 0.33 u[IU]/mL — ABNORMAL LOW (ref 0.450–4.500)

## 2016-10-09 ENCOUNTER — Telehealth: Payer: Self-pay | Admitting: Internal Medicine

## 2016-10-09 NOTE — Telephone Encounter (Signed)
Patient returned Melanie's call. °

## 2016-10-16 NOTE — Addendum Note (Signed)
Addended by: Lurlean Nanny on: 10/16/2016 11:18 AM   Modules accepted: Orders

## 2016-11-26 ENCOUNTER — Ambulatory Visit (INDEPENDENT_AMBULATORY_CARE_PROVIDER_SITE_OTHER): Payer: Medicare Other | Admitting: Family Medicine

## 2016-11-26 ENCOUNTER — Ambulatory Visit: Payer: Managed Care, Other (non HMO) | Admitting: Internal Medicine

## 2016-11-26 ENCOUNTER — Encounter: Payer: Self-pay | Admitting: Family Medicine

## 2016-11-26 DIAGNOSIS — J111 Influenza due to unidentified influenza virus with other respiratory manifestations: Secondary | ICD-10-CM

## 2016-11-26 MED ORDER — PREDNISONE 10 MG PO TABS: ORAL_TABLET | ORAL | 0 refills | Status: DC

## 2016-11-26 MED ORDER — AZITHROMYCIN 250 MG PO TABS
ORAL_TABLET | ORAL | 0 refills | Status: DC
Start: 2016-11-26 — End: 2017-03-11

## 2016-11-26 NOTE — Patient Instructions (Signed)
Presumed flu, getting some better.  Prednisone taper with food.  Use inhaler as needed.  If more cough, more sputum, more fever, then start zithromax and update [place].  Take care.  Glad to see you.

## 2016-11-26 NOTE — Progress Notes (Signed)
Pre visit review using our clinic review tool, if applicable. No additional management support is needed unless otherwise documented below in the visit note. 

## 2016-11-26 NOTE — Progress Notes (Signed)
She is thinking about quitting smoking.  Less smoking while sick in the last week or so.  D/w pt.  Encouraged.  She is considering quitting cold [place].    Sx started about 5 days ago.  Vomited initially.  Fatigued.  Lightheaded prev- some better now.  Feels diffusely weak.    She had rx leftover for prednisone.  Restarted in the meantime, 10mg  a day with some relief.  Has SABA to use, but hasn't used recently.    Prev with some wheeze, worse at night.  No fevers now, but did have fevers and chills initially.    Some cough, some sputum.  She had a lot of aches prev, better now. She feels some better, but not back to baseline.    Meds, vitals, and allergies reviewed.   ROS: Per HPI unless specifically indicated in ROS section   GEN: nad, alert and oriented HEENT: mucous membranes moist, tm w/o erythema, nasal exam w/o erythema, clear discharge noted,  OP with cobblestoning NECK: supple w/o LA CV: rrr.   PULM: ctab, no inc wob EXT: no edema SKIN: no acute rash Frontal sinuses slightly ttp

## 2016-11-27 DIAGNOSIS — J111 Influenza due to unidentified influenza virus with other respiratory manifestations: Secondary | ICD-10-CM | POA: Insufficient documentation

## 2016-11-27 NOTE — Assessment & Plan Note (Signed)
Presumed flu, getting some better.  Prednisone taper with food.  Use inhaler as needed.  If more cough, more sputum, more fever, then start zithromax and update [place].  Smoking cessation encouraged.   Okay for outpatient f/u.   She agrees.

## 2017-03-11 ENCOUNTER — Other Ambulatory Visit: Payer: Self-pay | Admitting: Family Medicine

## 2017-03-11 ENCOUNTER — Ambulatory Visit (INDEPENDENT_AMBULATORY_CARE_PROVIDER_SITE_OTHER): Payer: Medicare Other | Admitting: Family Medicine

## 2017-03-11 ENCOUNTER — Ambulatory Visit (INDEPENDENT_AMBULATORY_CARE_PROVIDER_SITE_OTHER): Payer: Medicare Other

## 2017-03-11 ENCOUNTER — Encounter: Payer: Self-pay | Admitting: Family Medicine

## 2017-03-11 VITALS — BP 130/68 | HR 72 | Temp 98.9°F | Wt 115.4 lb

## 2017-03-11 DIAGNOSIS — J441 Chronic obstructive pulmonary disease with (acute) exacerbation: Secondary | ICD-10-CM

## 2017-03-11 MED ORDER — AZITHROMYCIN 250 MG PO TABS: ORAL_TABLET | ORAL | 0 refills | Status: DC

## 2017-03-11 MED ORDER — ALBUTEROL SULFATE HFA 108 (90 BASE) MCG/ACT IN AERS: 1.0000 | INHALATION_SPRAY | Freq: Four times a day (QID) | RESPIRATORY_TRACT | 1 refills | Status: DC | PRN

## 2017-03-11 MED ORDER — PREDNISONE 50 MG PO TABS: ORAL_TABLET | ORAL | 0 refills | Status: DC

## 2017-03-11 NOTE — Assessment & Plan Note (Signed)
I suspect the patient has COPD. She appears to be expressing a COPD exacerbation. Chest x-ray today. Treating with prednisone. We will proceed with antibiotic once chest x-ray returns. Albuterol as needed.

## 2017-03-11 NOTE — Progress Notes (Signed)
Subjective:  Patient ID: Pamela Haley, female    DOB: October 27, 1951  Age: 66 y.o. MRN: [id number]  CC: ST, Cough, Congestion  HPI:  66 year old female with tobacco abuse and a reported history of COPD per the EMR (although this is not been confirmed with spirometry or pulmonary function tests) presents with the above complaints.  Patient reports that she's been sick since Friday. She's had sore throat, congestion, productive cough, and fatigue. She continues to smoke. She had a fever early in the course of her illness but has had no fever since then. She endorses shortness of breath with exertion. She appears to be dyspneic today. No medications or interventions tried. No known exacerbating factors. No other associated symptoms. No other complaints this time.  Social Hx   Social History   Social History  . Marital status: Single    Spouse name: N/A  . Number of children: N/A  . Years of education: N/A   Social History Main Topics  . Smoking status: Current Every Day Smoker    Packs/day: 1.00    Years: 30.00    Types: Cigarettes  . Smokeless tobacco: Never Used  . Alcohol use 0.0 oz/week     Comment: rare  . Drug use: No  . Sexual activity: Not Currently   Other Topics Concern  . None   Social History Narrative  . None    Review of Systems  Constitutional: Positive for fever.  HENT: Positive for congestion.   Respiratory: Positive for cough and shortness of breath.    Objective:  BP 130/68   Pulse 72   Temp 98.9 F (37.2 C) (Oral)   Wt 115 lb 6 oz (52.3 kg)   SpO2 91%   BMI 19.80 kg/m   BP/Weight 03/11/2017 11/26/2016 20/11/5425  Systolic BP 062 376 283  Diastolic BP 68 60 78  Wt. (Lbs) 115.38 119.25 119  BMI 19.8 20.47 20.43   Physical Exam  Constitutional: She is oriented to person, place, and time. She appears well-developed.  Mild increased work of breathing.  HENT:  Mouth/Throat: Oropharynx is clear and moist.  Cardiovascular: Regular rhythm.     Tachycardic.  Pulmonary/Chest:  Coarse breath sounds and wheezing. Mild increased work of breathing.  Neurological: She is alert and oriented to person, place, and time.  Psychiatric: She has a normal mood and affect.  Vitals reviewed.   Lab Results  Component Value Date   WBC 11.3 (H) 10/01/2016   HGB 16.1 (H) 05/15/2012   HCT 50.8 (H) 10/01/2016   PLT 275 10/01/2016   GLUCOSE 89 10/01/2016   CHOL 239 (H) 10/01/2016   TRIG 95 10/01/2016   HDL 47 10/01/2016   LDLCALC 173 (H) 10/01/2016   ALT 13 10/01/2016   AST 13 10/01/2016   NA 142 10/01/2016   K 4.6 10/01/2016   CL 98 10/01/2016   CREATININE 0.75 10/01/2016   BUN 12 10/01/2016   CO2 27 10/01/2016   TSH 0.330 (L) 10/01/2016   INR 0.94 06/19/2011   HGBA1C 5.9 (H) 10/01/2016    Assessment & Plan:   Problem List Items Addressed This Visit    COPD exacerbation ([place]) - Primary    I suspect the patient has COPD. She appears to be expressing a COPD exacerbation. Chest x-ray today. Treating with prednisone. We will proceed with antibiotic once chest x-ray returns. Albuterol as needed.      Relevant Medications   albuterol (PROVENTIL HFA;VENTOLIN HFA) 108 (90 Base) MCG/ACT inhaler  predniSONE (DELTASONE) 50 MG tablet   Other Relevant Orders   DG Chest 2 View     Meds ordered this encounter  Medications  . albuterol (PROVENTIL HFA;VENTOLIN HFA) 108 (90 Base) MCG/ACT inhaler    Sig: Inhale 1-2 puffs into the lungs every 6 (six) hours as needed for wheezing or shortness of breath.    Dispense:  18 g    Refill:  1  . predniSONE (DELTASONE) 50 MG tablet    Sig: 1 tablet daily x 5 days.    Dispense:  5 tablet    Refill:  0   Follow-up: PRN  Geronimo

## 2017-03-11 NOTE — Patient Instructions (Signed)
We will call with the xray results.  Prednisone as prescribed.  I will send in an antibiotic after your xray returns.  Take care  Dr. Lacinda Axon

## 2017-04-01 ENCOUNTER — Telehealth: Payer: Self-pay | Admitting: Internal Medicine

## 2017-04-03 ENCOUNTER — Other Ambulatory Visit: Payer: Self-pay

## 2017-04-03 MED ORDER — CYCLOBENZAPRINE HCL 10 MG PO TABS: 10.0000 mg | ORAL_TABLET | Freq: Three times a day (TID) | ORAL | 0 refills | Status: DC | PRN

## 2017-04-03 NOTE — Telephone Encounter (Signed)
Pt left v/m requesting refill for flexeril for pts back; pt takes whenever has back problem. Last refilled # 30 on 09/05/16; last annual 10/01/16.Avie Echevaria NP out of office.Please advise. [facility] [address].

## 2017-04-04 NOTE — Telephone Encounter (Signed)
Paperwork in [place] For review and signature

## 2017-04-04 NOTE — Telephone Encounter (Signed)
Left message asking pt to call office have questions about fmla paperwork

## 2017-04-07 NOTE — Telephone Encounter (Signed)
Done given back to Pamela Haley 

## 2017-04-08 NOTE — Telephone Encounter (Signed)
Paperwork faxed Copy for pt Copy for scan Copy for file  Pt awate

## 2017-04-09 ENCOUNTER — Telehealth: Payer: Self-pay | Admitting: Internal Medicine

## 2017-04-09 NOTE — Telephone Encounter (Signed)
Ibuprofen or Tylenol and Delsym

## 2017-04-09 NOTE — Telephone Encounter (Signed)
Pt is aware as instructed 

## 2017-04-09 NOTE — Telephone Encounter (Signed)
[place]  Patient Name: Pamela Haley  DOB: [birth year]    Initial Comment Caller states she has appt tomorrow, wants to know what to do for fever, sore throat and chills today   Nurse Assessment  Nurse: Arthor Captain, RN, Margaret Date/Time (Eastern Time): 04/09/2017 2:55:47 PM  Confirm and document reason for call. If symptomatic, describe symptoms. ---Caller states she has a doctors appointment tomorrow and she now has has a sore throat, shivers, and temp is 99.3 oral. She has a cough as well.  Does the patient have any new or worsening symptoms? ---Yes  Will a triage be completed? ---Yes  Related visit to physician within the last 2 weeks? ---No  Does the PT have any chronic conditions? (i.e. diabetes, asthma, etc.) ---Yes  List chronic conditions. ---COPD  Is this a behavioral health or substance abuse call? ---No     Guidelines    Guideline Title Affirmed Question Affirmed Notes  Sore Throat [1] Sore throat with cough/cold symptoms AND [2] present < 5 days    Final Disposition User   Skillman, RN, Margaret    Disagree/Comply: Comply

## 2017-04-09 NOTE — Telephone Encounter (Signed)
Pt has appt on 04/10/17 3:45 with Avie Echevaria NP.

## 2017-04-10 ENCOUNTER — Ambulatory Visit (INDEPENDENT_AMBULATORY_CARE_PROVIDER_SITE_OTHER): Payer: Medicare Other | Admitting: Internal Medicine

## 2017-04-10 ENCOUNTER — Encounter: Payer: Self-pay | Admitting: Internal Medicine

## 2017-04-10 VITALS — BP 122/80 | HR 104 | Temp 98.2°F | Wt 117.8 lb

## 2017-04-10 DIAGNOSIS — J441 Chronic obstructive pulmonary disease with (acute) exacerbation: Secondary | ICD-10-CM | POA: Diagnosis not present

## 2017-04-10 MED ORDER — PREDNISONE 10 MG PO TABS: ORAL_TABLET | ORAL | 0 refills | Status: DC

## 2017-04-10 MED ORDER — FLUTICASONE PROPIONATE (INHAL) 50 MCG/BLIST IN AEPB: 1.0000 | INHALATION_SPRAY | Freq: Two times a day (BID) | RESPIRATORY_TRACT | 12 refills | Status: DC

## 2017-04-10 NOTE — Patient Instructions (Signed)

## 2017-04-10 NOTE — Progress Notes (Signed)
HPI  Pt presents to the clinic today with c/o sore throat and cough. This started 2-3 days ago. She denies difficulty swallowing. The cough is nonproductive. She reports she gets into coughing fits, to the point that she gags. She denies shortness of breath. She reports she has run fevers up to 99.8, had chills and body aches. She has not tried anything OTC for this. She has no history of allergies. She has not had sick contacts. She does smoke. She was seen 1 month ago for the same. Chest xray c/w COPD. She was treated with Prednisone and Azithromycin with complete resolution of symptoms.  Review of Systems        Past Medical History:  Diagnosis Date  . Abnormal EKG    HX OF PREVIOUS EKGS SHOWING POSSIBLE MI--BUT PT HAS NEVER HAD ANY HEART PROBLEMS--HAD BACK SURGERY AUG 2012 AT [facility] - NO HEART PROBLEMS  . Chronic cough    PT IS A SMOKER  . DDD (degenerative disc disease), lumbar    PAIN IN LOWER BACK AND DOWN RT HIP AND LEG--NUMBNESS, THROBBING, BURNING  . Hyperthyroidism 02/20/2015    Family History  Problem Relation Age of Onset  . Heart disease Mother   . Heart disease Father   . Stroke Maternal Grandmother   . Diabetes Maternal Grandmother     Social History   Social History  . Marital status: Single    Spouse name: N/A  . Number of children: N/A  . Years of education: N/A   Occupational History  . Not on file.   Social History Main Topics  . Smoking status: Current Every Day Smoker    Packs/day: 1.00    Years: 30.00    Types: Cigarettes  . Smokeless tobacco: Never Used  . Alcohol use 0.0 oz/week     Comment: rare  . Drug use: No  . Sexual activity: Not Currently   Other Topics Concern  . Not on file   Social History Narrative  . No narrative on file    No Known Allergies   Constitutional: Positive fatigue and fever. Denies headache, abrupt weight changes.  HEENT:  Positive sore throat. Denies eye redness, eye pain, pressure behind the eyes, facial  pain, nasal congestion, ear pain, ringing in the ears, wax buildup, runny nose or bloody nose. Respiratory: Positive cough. Denies difficulty breathing or shortness of breath.  Cardiovascular: Denies chest pain, chest tightness, palpitations or swelling in the hands or feet.   No other specific complaints in a complete review of systems (except as listed in HPI above).  Objective:   BP 122/80   Pulse (!) 104   Temp 98.2 F (36.8 C) (Oral)   Wt 117 lb 12 oz (53.4 kg)   SpO2 91%   BMI 20.21 kg/m   Wt Readings from Last 3 Encounters:  04/10/17 117 lb 12 oz (53.4 kg)  03/11/17 115 lb 6 oz (52.3 kg)  11/26/16 119 lb 4 oz (54.1 kg)     General: Appears her stated age, in NAD. HEENT: Throat/Mouth: + PND. Teeth present, mucosa pink and moist, no exudate noted, no lesions or ulcerations noted.  Neck: No cervical lymphadenopathy.  Pulmonary/Chest: Normal effort and positive vesicular breath sounds with intermittent expiratory wheezing noted. No respiratory distress. No rales or ronchi noted.       Assessment & Plan:   COPD Exacerbation:  Get some rest and drink plenty of water Advised her to stop smoking eRx for Pred Taper x 6 days  eRx for Flovent to start daily once you are done with the Prednisone Delsym as needed for cough  RTC as needed or if symptoms persist.   Webb Silversmith, NP

## 2017-04-21 ENCOUNTER — Telehealth: Payer: Self-pay | Admitting: Internal Medicine

## 2017-04-21 NOTE — Telephone Encounter (Signed)
Pt states it is for SOB from COPD

## 2017-04-21 NOTE — Telephone Encounter (Signed)
In [place]

## 2017-04-21 NOTE — Telephone Encounter (Signed)
Pt faxed a DMV disability form on 04/16/17 to be filled out. She is calling to get the status, was it received, signed? What does she need to do next?

## 2017-04-21 NOTE — Telephone Encounter (Signed)
She should probably come in for spirometry to see if her COPD is adequately controlled.

## 2017-04-22 NOTE — Telephone Encounter (Signed)
Pt is aware as instructed and she states she does not have the money or time to come in for that right now and says she does not understand why that is neccessary... Please advise

## 2017-04-22 NOTE — Telephone Encounter (Signed)
Because if her COPD is uncontrolled to the point that she can't walk from a parking lot to a store, then we need to change her treatment regimen. And she has never mentioned exertional shortness of breath to me prior. Just having COPD doesn't qualify you for a placard.

## 2017-04-24 NOTE — Telephone Encounter (Signed)
Left detailed msg on VM per HIPAA  

## 2017-05-17 ENCOUNTER — Emergency Department
Admission: EM | Admit: 2017-05-17 | Discharge: 2017-05-17 | Disposition: A | Discharge status: Home / Self Care | Payer: Medicare Other | Attending: Emergency Medicine | Admitting: Emergency Medicine

## 2017-05-17 ENCOUNTER — Emergency Department: Payer: Medicare Other

## 2017-05-17 DIAGNOSIS — Z79899 Other long term (current) drug therapy: Secondary | ICD-10-CM | POA: Diagnosis not present

## 2017-05-17 DIAGNOSIS — J449 Chronic obstructive pulmonary disease, unspecified: Secondary | ICD-10-CM | POA: Diagnosis not present

## 2017-05-17 DIAGNOSIS — M545 Low back pain, unspecified: Secondary | ICD-10-CM

## 2017-05-17 DIAGNOSIS — R000 Tachycardia, unspecified: Secondary | ICD-10-CM | POA: Diagnosis not present

## 2017-05-17 DIAGNOSIS — R1031 Right lower quadrant pain: Secondary | ICD-10-CM | POA: Diagnosis not present

## 2017-05-17 DIAGNOSIS — R109 Unspecified abdominal pain: Secondary | ICD-10-CM

## 2017-05-17 DIAGNOSIS — F17210 Nicotine dependence, cigarettes, uncomplicated: Secondary | ICD-10-CM | POA: Insufficient documentation

## 2017-05-17 DIAGNOSIS — M549 Dorsalgia, unspecified: Secondary | ICD-10-CM | POA: Diagnosis not present

## 2017-05-17 LAB — CBC
HCT: 50.7 % — ABNORMAL HIGH (ref 35.0–47.0)
Hemoglobin: 17.2 g/dL — ABNORMAL HIGH (ref 12.0–16.0)
MCH: 30.5 pg (ref 26.0–34.0)
MCHC: 34 g/dL (ref 32.0–36.0)
MCV: 89.8 fL (ref 80.0–100.0)
PLATELETS: 251 10*3/uL (ref 150–440)
RBC: 5.64 MIL/uL — ABNORMAL HIGH (ref 3.80–5.20)
RDW: 13.9 % (ref 11.5–14.5)
WBC: 11.8 10*3/uL — ABNORMAL HIGH (ref 3.6–11.0)

## 2017-05-17 LAB — URINALYSIS, COMPLETE (UACMP) WITH MICROSCOPIC
Bacteria, UA: NONE SEEN
Bilirubin Urine: NEGATIVE
Glucose, UA: NEGATIVE mg/dL
Hgb urine dipstick: NEGATIVE
Ketones, ur: 5 mg/dL — AB
Nitrite: NEGATIVE
Protein, ur: 100 mg/dL — AB
Specific Gravity, Urine: 1.013 (ref 1.005–1.030)
pH: 8 (ref 5.0–8.0)

## 2017-05-17 LAB — COMPREHENSIVE METABOLIC PANEL WITH GFR
ALT: 17 U/L (ref 14–54)
AST: 29 U/L (ref 15–41)
Albumin: 4.2 g/dL (ref 3.5–5.0)
Alkaline Phosphatase: 81 U/L (ref 38–126)
Anion gap: 11 (ref 5–15)
BUN: 11 mg/dL (ref 6–20)
CO2: 28 mmol/L (ref 22–32)
Calcium: 9.9 mg/dL (ref 8.9–10.3)
Chloride: 99 mmol/L — ABNORMAL LOW (ref 101–111)
Creatinine, Ser: 0.7 mg/dL (ref 0.44–1.00)
GFR calc Af Amer: >60 mL/min
GFR calc non Af Amer: >60 mL/min
Glucose, Bld: 155 mg/dL — ABNORMAL HIGH (ref 65–99)
Potassium: 3.7 mmol/L (ref 3.5–5.1)
Sodium: 138 mmol/L (ref 135–145)
Total Bilirubin: 1 mg/dL (ref 0.3–1.2)
Total Protein: 8.2 g/dL — ABNORMAL HIGH (ref 6.5–8.1)

## 2017-05-17 LAB — LIPASE, BLOOD: Lipase: 29 U/L (ref 11–51)

## 2017-05-17 LAB — TROPONIN I: Troponin I: <0.03 ng/mL (ref <0.03)

## 2017-05-17 MED ORDER — ONDANSETRON HCL 4 MG/2ML IJ SOLN
4.0000 mg | Freq: Once | INTRAMUSCULAR | Status: AC | PRN
  Administered 2017-05-17: 4 mg via INTRAVENOUS
  Filled 2017-05-17: qty 2

## 2017-05-17 MED ORDER — FENTANYL CITRATE (PF) 100 MCG/2ML IJ SOLN
50.0000 ug | INTRAMUSCULAR | Status: AC | PRN
  Administered 2017-05-17 (×2): 50 ug via INTRAVENOUS
  Filled 2017-05-17 (×2): qty 2

## 2017-05-17 MED ORDER — KETOROLAC TROMETHAMINE 30 MG/ML IJ SOLN
15.0000 mg | Freq: Once | INTRAMUSCULAR | Status: AC
  Administered 2017-05-17: 15 mg via INTRAVENOUS
  Filled 2017-05-17: qty 1

## 2017-05-17 NOTE — ED Triage Notes (Signed)
Pt came to [facility] via EMS c/o right sided flank pain. Denies urinary symptoms. Reports nausea. Vs stable.

## 2017-05-17 NOTE — ED Provider Notes (Addendum)
[facility] Emergency Department Provider Note  ____________________________________________   I have reviewed the triage vital signs and the nursing notes.   HISTORY  Chief Complaint Flank Pain    HPI Pamela Haley is a 66 y.o. female presents today complains of right-sided flank pain. She denies dysuria. She does E Mary frequency. She states she's had pain like this before but never to this extent. Began gradually today got much worse. She denies any fever or chills. Has had nausea but no vomiting. Denies any other GI symptoms. She has had no fever or chills. The pain is constantly makes it better nothing makes it worse. She isn't had a kidney stone as a strong family history of kidney stones. Pain is localized to the right flank with no radiation. She tried taking Flexeril home with minimal relief also EMS gave her fentanyl that did not seem to help either. Patient does have a history is very common recurrent back pain but this feels to be more lateral to her. Denies chest pain or shortness of breath.   Past Medical History:  Diagnosis Date  . Abnormal EKG    HX OF PREVIOUS EKGS SHOWING POSSIBLE MI--BUT PT HAS NEVER HAD ANY HEART PROBLEMS--HAD BACK SURGERY AUG 2012 AT [facility] - NO HEART PROBLEMS  . Chronic cough    PT IS A SMOKER  . DDD (degenerative disc disease), lumbar    PAIN IN LOWER BACK AND DOWN RT HIP AND LEG--NUMBNESS, THROBBING, BURNING  . Hyperthyroidism 02/20/2015    Patient Active Problem List   Diagnosis Date Noted  . COPD (chronic obstructive pulmonary disease) ([place]) 01/25/2016  . DDD (degenerative disc disease), lumbar 02/20/2015  . Hyperthyroidism 02/20/2015  . HNP (herniated nucleus pulposus), lumbar 05/21/2012    Past Surgical History:  Procedure Laterality Date  . BACK SURGERY  05/2011   lumbar surgery at [facility]  . BILATERAL CARPAL TUNNEL RELEASE    . C SECTIONS X 3      Prior to Admission medications   Medication Sig Start Date  End Date Taking? Authorizing Provider  albuterol (PROVENTIL HFA;VENTOLIN HFA) 108 (90 Base) MCG/ACT inhaler Inhale 1-2 puffs into the lungs every 6 (six) hours as needed for wheezing or shortness of breath. Patient not taking: Reported on 04/10/2017 03/11/17   Coral Spikes, DO  Ascorbic Acid (VITAMIN C) 1000 MG tablet Take 1,000 mg by mouth daily.    [provider]  Biotin 10 MG TABS Take 1 tablet by mouth daily.    [provider]  cyclobenzaprine (FLEXERIL) 10 MG tablet Take 1 tablet (10 mg total) by mouth 3 (three) times daily as needed for muscle spasms. 04/03/17   Lucille Passy, MD  fluticasone (FLOVENT DISKUS) 50 MCG/BLIST diskus inhaler Inhale 1 puff into the lungs 2 (two) times daily. 04/10/17   Jearld Fenton, NP  Multiple Vitamins-Minerals (HAIR/SKIN/NAILS PO) Take 1 capsule by mouth daily.    [provider]  predniSONE (DELTASONE) 10 MG tablet Take 3 tabs on days 1-2, take 2 tabs on days 3-4, take 1 tab on days 5-6 04/10/17   Jearld Fenton, NP    Allergies Patient has no known allergies.  Family History  Problem Relation Age of Onset  . Heart disease Mother   . Heart disease Father   . Stroke Maternal Grandmother   . Diabetes Maternal Grandmother     Social History Social History  Substance Use Topics  . Smoking status: Current Every Day Smoker  Packs/day: 1.00    Years: 30.00    Types: Cigarettes  . Smokeless tobacco: Never Used  . Alcohol use 0.0 oz/week     Comment: rare    Review of Systems Constitutional: No fever/chills Eyes: No visual changes. ENT: No sore throat. No stiff neck no neck pain Cardiovascular: Denies chest pain. Respiratory: Denies shortness of breath. Gastrointestinal:   no vomiting.  No diarrhea.  No constipation. Genitourinary: Negative for dysuria. Musculoskeletal: Negative lower extremity swelling Skin: Negative for rash. Neurological: Negative for severe headaches, focal weakness or  numbness.   ____________________________________________   PHYSICAL EXAM:  VITAL SIGNS: ED Triage Vitals  Enc Vitals Group     BP 05/17/17 1238 (!) 146/92     Pulse Rate 05/17/17 1238 (!) 105     Resp 05/17/17 1238 18     Temp 05/17/17 1238 97.8 F (36.6 C)     Temp Source 05/17/17 1238 Oral     SpO2 05/17/17 1238 100 %     Weight 05/17/17 1241 117 lb (53.1 kg)     Height 05/17/17 1241 5\' 5"  (1.651 m)     Head Circumference --      Peak Flow --      Pain Score 05/17/17 1603 8     Pain Loc --      Pain Edu? --      Excl. in [place]? --     Constitutional: Alert and oriented. Well appearing and in no acute distress. Eyes: Conjunctivae are normal Head: Atraumatic HEENT: No congestion/rhinnorhea. Mucous membranes are moist.  Oropharynx non-erythematous Neck:   Nontender with no meningismus, no masses, no stridor Cardiovascular: Normal rate, regular rhythm. Grossly normal heart sounds.  Good peripheral circulation. Respiratory: Normal respiratory effort.  No retractions. Lungs CTAB. Abdominal: Soft and nontender. No distention. No guarding no rebound Back:    there is no midline tenderness there are no lesions noted. + paraspinal muscle tenderness in the right back which leads towards but does not include the right flank Musculoskeletal: No lower extremity tenderness, no upper extremity tenderness. No joint effusions, no DVT signs strong distal pulses no edema Neurologic:  Normal speech and language. No gross focal neurologic deficits are appreciated.  Skin:  Skin is warm, dry and intact. No rash noted. Psychiatric: Mood and affect are normal. Speech and behavior are normal.  ____________________________________________   LABS (all labs ordered are listed, but only abnormal results are displayed)  Labs Reviewed  COMPREHENSIVE METABOLIC PANEL - Abnormal; Notable for the following:       Result Value   Chloride 99 (*)    Glucose, Bld 155 (*)    Total Protein 8.2 (*)    All  other components within normal limits  CBC - Abnormal; Notable for the following:    WBC 11.8 (*)    RBC 5.64 (*)    Hemoglobin 17.2 (*)    HCT 50.7 (*)    All other components within normal limits  URINALYSIS, COMPLETE (UACMP) WITH MICROSCOPIC - Abnormal; Notable for the following:    Color, Urine YELLOW (*)    APPearance HAZY (*)    Ketones, ur 5 (*)    Protein, ur 100 (*)    Leukocytes, UA TRACE (*)    Squamous Epithelial / LPF 0-5 (*)    All other components within normal limits  LIPASE, BLOOD  TROPONIN I   ____________________________________________  EKG  I personally interpreted any EKGs ordered by me or triage Sinus rhythm rate 103, mild  tachycardia noted. Likely repolarization abnormality noted. Nonspecific ST changes ____________________________________________  RADIOLOGY  I reviewed any imaging ordered by me or triage that were performed during my shift and, if possible, patient and/or family made aware of any abnormal findings. ____________________________________________   PROCEDURES  Procedure(s) performed: None  Procedures  Critical Care performed: None  ____________________________________________   INITIAL IMPRESSION / ASSESSMENT AND PLAN / ED COURSE  Pertinent labs & imaging results that were available during my care of the patient were reviewed by me and considered in my medical decision making (see chart for details).  Patient with very reproducible right-sided Back pain that began this morning history of similar but not this bad." Clear exactly what the cause is. Kidney stone certainly high on the list although she does not seem to have significant hematuria. We will obtain CT scan given her age. AAA is a consideration but I do not palpate one and she is quite slender. Do not think this likely represents intrathoracic pathology of a supradiaphragmatic nature. Marland Kitchen  ----------------------------------------- 6:22 PM on  05/17/2017 -----------------------------------------  CT reassuring, patient took a nice nap after Toradol she is nearly pain-free I can still reproduce the pain by palpating the paraspinal muscle chronic back pain is a problem with the patient at this time she states she feels it is her chronic back pain. No evidence of other significant pathology noted and we will discharge her she is neurologically intact return precautions are given and understood.    ____________________________________________   FINAL CLINICAL IMPRESSION(S) / ED DIAGNOSES  Final diagnoses:  None      This chart was dictated using voice recognition software.  Despite best efforts to proofread,  errors can occur which can change meaning.      Schuyler Amor, MD 05/17/17 1611    Schuyler Amor, MD 05/17/17 Vernelle Emerald

## 2017-05-17 NOTE — ED Notes (Signed)
Pt verbalized understanding of discharge instructions. NAD at this time. 

## 2017-05-20 ENCOUNTER — Encounter: Payer: Self-pay | Admitting: Internal Medicine

## 2017-05-20 ENCOUNTER — Ambulatory Visit (INDEPENDENT_AMBULATORY_CARE_PROVIDER_SITE_OTHER): Payer: Medicare Other | Admitting: Internal Medicine

## 2017-05-20 VITALS — BP 120/76 | HR 132 | Temp 97.8°F | Wt 112.5 lb

## 2017-05-20 DIAGNOSIS — M545 Low back pain, unspecified: Secondary | ICD-10-CM

## 2017-05-20 MED ORDER — IBUPROFEN 800 MG PO TABS: 800.0000 mg | ORAL_TABLET | Freq: Three times a day (TID) | ORAL | 0 refills | Status: DC | PRN

## 2017-05-20 NOTE — Progress Notes (Signed)
Subjective:    Patient ID: Pamela Haley, female    DOB: 1951/07/20, 66 y.o.   MRN: [id number]  HPI  Pt presents to the clinic today for ER follow up. She went to the ER 7/21 with c/o right flank pain. CT scan of the abdomen did not reveal any acute abnormality. They did not feel like her pain was c/w a kidney stone. They felt like this may be related to her chronic back issues. She was given a shot of Toradol and advised to follow up with her PCP.  Since discharge, she reports the pain has radiated to her right lower back. She describes the pain as dull and achy. The pain does not radiate. She denies numbness or tingling in her legs. She denies loss of bowel or bladders. She has tried leftover Oxycodone from her previous surgery without any relief. She has not tried anything additional OTC.  Review of Systems  Past Medical History:  Diagnosis Date  . Abnormal EKG    HX OF PREVIOUS EKGS SHOWING POSSIBLE MI--BUT PT HAS NEVER HAD ANY HEART PROBLEMS--HAD BACK SURGERY AUG 2012 AT [facility] - NO HEART PROBLEMS  . Chronic cough    PT IS A SMOKER  . DDD (degenerative disc disease), lumbar    PAIN IN LOWER BACK AND DOWN RT HIP AND LEG--NUMBNESS, THROBBING, BURNING  . Hyperthyroidism 02/20/2015    Current Outpatient Prescriptions  Medication Sig Dispense Refill  . albuterol (PROVENTIL HFA;VENTOLIN HFA) 108 (90 Base) MCG/ACT inhaler Inhale 1-2 puffs into the lungs every 6 (six) hours as needed for wheezing or shortness of breath. (Patient not taking: Reported on 04/10/2017) 18 g 1  . Ascorbic Acid (VITAMIN C) 1000 MG tablet Take 1,000 mg by mouth daily.    . Biotin 10 MG TABS Take 1 tablet by mouth daily.    . cyclobenzaprine (FLEXERIL) 10 MG tablet Take 1 tablet (10 mg total) by mouth 3 (three) times daily as needed for muscle spasms. 30 tablet 0  . fluticasone (FLOVENT DISKUS) 50 MCG/BLIST diskus inhaler Inhale 1 puff into the lungs 2 (two) times daily. 1 Inhaler 12  . Multiple Vitamins-Minerals  (HAIR/SKIN/NAILS PO) Take 1 capsule by mouth daily.    . predniSONE (DELTASONE) 10 MG tablet Take 3 tabs on days 1-2, take 2 tabs on days 3-4, take 1 tab on days 5-6 12 tablet 0   No current facility-administered medications for this visit.     No Known Allergies  Family History  Problem Relation Age of Onset  . Heart disease Mother   . Heart disease Father   . Stroke Maternal Grandmother   . Diabetes Maternal Grandmother     Social History   Social History  . Marital status: Single    Spouse name: N/A  . Number of children: N/A  . Years of education: N/A   Occupational History  . Not on file.   Social History Main Topics  . Smoking status: Current Every Day Smoker    Packs/day: 1.00    Years: 30.00    Types: Cigarettes  . Smokeless tobacco: Never Used  . Alcohol use 0.0 oz/week     Comment: rare  . Drug use: No  . Sexual activity: Not Currently   Other Topics Concern  . Not on file   Social History Narrative  . No narrative on file     Constitutional: Denies fever, malaise, fatigue, headache or abrupt weight changes.  Gastrointestinal: Denies abdominal pain, bloating, constipation, diarrhea or  blood in the stool.  GU: Denies urgency, frequency, pain with urination, burning sensation, blood in urine, odor or discharge. Musculoskeletal: Pt reports right low back pain. Denies decrease in range of motion, difficulty with gait, muscle pain or joint swelling.   No other specific complaints in a complete review of systems (except as listed in HPI above).     Objective:   Physical Exam  BP 120/76   Pulse (!) 132   Temp 97.8 F (36.6 C) (Oral)   Wt 112 lb 8 oz (51 kg)   SpO2 92%   BMI 18.72 kg/m  Wt Readings from Last 3 Encounters:  05/20/17 112 lb 8 oz (51 kg)  05/17/17 117 lb (53.1 kg)  04/10/17 117 lb 12 oz (53.4 kg)    General: Appears her stated age, in NAD. Skin: Warm, dry and intact. No rashesnoted. Abdomen: Soft and nontender. Normal bowel  sounds. No distention or masses noted.  Musculoskeletal: Normal flexion, extension and rotation of the spine. No bony tenderness noted over the spine. Pain with palpation of the right paralumbar muscles. No difficulty with gait.   BMET    Component Value Date/Time   NA 138 05/17/2017 1240   NA 142 10/01/2016 1412   K 3.7 05/17/2017 1240   CL 99 (L) 05/17/2017 1240   CO2 28 05/17/2017 1240   GLUCOSE 155 (H) 05/17/2017 1240   BUN 11 05/17/2017 1240   BUN 12 10/01/2016 1412   CREATININE 0.70 05/17/2017 1240   CALCIUM 9.9 05/17/2017 1240   GFRNONAA >60 05/17/2017 1240   GFRAA >60 05/17/2017 1240    Lipid Panel     Component Value Date/Time   CHOL 239 (H) 10/01/2016 1412   TRIG 95 10/01/2016 1412   HDL 47 10/01/2016 1412   CHOLHDL 5.1 (H) 10/01/2016 1412   LDLCALC 173 (H) 10/01/2016 1412    CBC    Component Value Date/Time   WBC 11.8 (H) 05/17/2017 1240   RBC 5.64 (H) 05/17/2017 1240   HGB 17.2 (H) 05/17/2017 1240   HGB 17.7 (H) 10/01/2016 1412   HCT 50.7 (H) 05/17/2017 1240   HCT 50.8 (H) 10/01/2016 1412   PLT 251 05/17/2017 1240   PLT 275 10/01/2016 1412   MCV 89.8 05/17/2017 1240   MCV 93 10/01/2016 1412   MCH 30.5 05/17/2017 1240   MCHC 34.0 05/17/2017 1240   RDW 13.9 05/17/2017 1240   RDW 13.0 10/01/2016 1412   LYMPHSABS 4.0 05/15/2012 1525   MONOABS 0.8 05/15/2012 1525   EOSABS 0.3 05/15/2012 1525   BASOSABS 0.1 05/15/2012 1525    Hgb A1C Lab Results  Component Value Date   HGBA1C 5.9 (H) 10/01/2016        Assessment & Plan:   ER follow up for Low Back Pain:  ER notes, labs and imaging Stretching exercises given eRx for Ibuprofen 800 mg TID Continue Flexeril as needed  Return precautions discussed Webb Silversmith, NP

## 2017-05-20 NOTE — Patient Instructions (Signed)

## 2017-06-12 ENCOUNTER — Ambulatory Visit (INDEPENDENT_AMBULATORY_CARE_PROVIDER_SITE_OTHER): Payer: Medicare Other | Admitting: Pulmonary Disease

## 2017-06-12 ENCOUNTER — Encounter: Payer: Self-pay | Admitting: Pulmonary Disease

## 2017-06-12 VITALS — BP 148/78 | HR 117 | Ht 65.0 in | Wt 118.0 lb

## 2017-06-12 DIAGNOSIS — F17200 Nicotine dependence, unspecified, uncomplicated: Secondary | ICD-10-CM

## 2017-06-12 DIAGNOSIS — J439 Emphysema, unspecified: Secondary | ICD-10-CM

## 2017-06-12 DIAGNOSIS — J42 Unspecified chronic bronchitis: Secondary | ICD-10-CM

## 2017-06-12 MED ORDER — TIOTROPIUM BROMIDE-OLODATEROL 2.5-2.5 MCG/ACT IN AERS
2.0000 | INHALATION_SPRAY | Freq: Every day | RESPIRATORY_TRACT | 0 refills | Status: DC
Start: 2017-06-12 — End: 2017-10-08

## 2017-06-15 NOTE — Progress Notes (Signed)
PULMONARY CONSULT NOTE  Requesting MD/Service: Self referred Date of initial consultation: 06/12/17 Reason for consultation: COPD  PT PROFILE: 66 y.o. female smoker with recent diagnosis of COPD after a flulike illness  HPI:  As above. She is concerned because she was recently told that she has COPD. This was when she presented with a flulike illness and underwent a chest x-ray. The results of this x-ray discussed below. At her baseline she is not very active. Shortness of breath is not limiting. She does experience occasional shortness of breath on the hot humid days. This has changed little in the past 5 years. There is no day-to-day variation. Her symptoms are somewhat worse in the summer because of heat and humidity. She does have chronic cough, rattling with occasional mucus. She denies hemoptysis, chest pain, orthopnea, PND, lower extremity edema, calf tenderness.  He started smoking at age 89. She has been a pack-a-day smoker in the past. She is presently trying to cut down and smokes approximately one half PPD. She has made 3 serious attempts to quit in the past. Proximally 12-15 years ago she abstained for 3 months. She notes that this was while she was on Zyban. She has tried NRT in the past without success.  She has minimal PMH. She has undergone several back surgeries for herniated disc. She takes no medications chronically. She is employed as a Librarian, academic at [employer].  Past Medical History:  Diagnosis Date  . Abnormal EKG    HX OF PREVIOUS EKGS SHOWING POSSIBLE MI--BUT PT HAS NEVER HAD ANY HEART PROBLEMS--HAD BACK SURGERY AUG 2012 AT [facility] - NO HEART PROBLEMS  . Chronic cough    PT IS A SMOKER  . DDD (degenerative disc disease), lumbar    PAIN IN LOWER BACK AND DOWN RT HIP AND LEG--NUMBNESS, THROBBING, BURNING  . Hyperthyroidism 02/20/2015    Past Surgical History:  Procedure Laterality Date  . BACK SURGERY  05/2011   lumbar surgery at [facility]  . BILATERAL CARPAL TUNNEL RELEASE     . C SECTIONS X 3      MEDICATIONS: I have reviewed all medications and confirmed regimen as documented  Social History   Social History  . Marital status: Single    Spouse name: N/A  . Number of children: N/A  . Years of education: N/A   Occupational History  . Not on file.   Social History Main Topics  . Smoking status: Current Every Day Smoker    Packs/day: 0.50    Years: 30.00    Types: Cigarettes  . Smokeless tobacco: Never Used  . Alcohol use 0.0 oz/week     Comment: rare  . Drug use: No  . Sexual activity: Not Currently   Other Topics Concern  . Not on file   Social History Narrative  . No narrative on file    Family History  Problem Relation Age of Onset  . Heart disease Mother   . Heart disease Father   . Stroke Maternal Grandmother   . Diabetes Maternal Grandmother     ROS: No fever, myalgias/arthralgias, unexplained weight loss or weight gain No new focal weakness or sensory deficits No otalgia, hearing loss, visual changes, nasal and sinus symptoms, mouth and throat problems No neck pain or adenopathy No abdominal pain, N/V/D, diarrhea, change in bowel pattern No dysuria, change in urinary pattern   Vitals:   06/12/17 1514 06/12/17 1517  BP:  (!) 148/78  Pulse:  (!) 117  SpO2:  93%  Weight: 118 lb (53.5 kg)   Height: 5\' 5"  (1.651 m)   RA  EXAM:  Gen: WDWN, No overt respiratory distress, frequent rattling cough HEENT: NCAT, sclera white, oropharynx normal Neck: Supple without LAN, thyromegaly, JVD Lungs: breath sounds diffusely diminished, hyperresonant to percussion, no adventitious sounds Cardiovascular: RRR, no murmurs noted Abdomen: Soft, nontender, normal BS Ext: without clubbing, cyanosis, edema Neuro: CNs grossly intact, motor and sensory intact Skin: Limited exam, no lesions noted  DATA:   BMP Latest Ref Rng & Units 05/17/2017 10/01/2016 03/09/2015  Glucose 65 - 99 mg/dL 155(H) 89 71  BUN 6 - 20 mg/dL 11 12 9   Creatinine  0.44 - 1.00 mg/dL 0.70 0.75 0.74  BUN/Creat Ratio 12 - 28 - 16 12  Sodium 135 - 145 mmol/L 138 142 143  Potassium 3.5 - 5.1 mmol/L 3.7 4.6 4.1  Chloride 101 - 111 mmol/L 99(L) 98 99  CO2 22 - 32 mmol/L 28 27 25   Calcium 8.9 - 10.3 mg/dL 9.9 9.8 9.4    CBC Latest Ref Rng & Units 05/17/2017 10/01/2016 03/09/2015  WBC 3.6 - 11.0 K/uL 11.8(H) 11.3(H) 9.5  Hemoglobin 12.0 - 16.0 g/dL 17.2(H) 17.7(H) 16.2(H)  Hematocrit 35.0 - 47.0 % 50.7(H) 50.8(H) 48.8(H)  Platelets 150 - 440 K/uL 251 275 249    CXR(03/11/17):  Hyperinflation, hyperlucency consistent with emphysema  IMPRESSION:     ICD-10-CM   1. Pulmonary emphysema, unspecified emphysema type ([place]) J43.9 Pulmonary Function Test [facility] Only  2. Smoker F17.200 Pulmonary Function Test [facility] Only  3. Chronic bronchitis, unspecified chronic bronchitis type ([place]) J42 Pulmonary Function Test [facility] Only     PLAN:  Smoking cessation was discussed in detail including the risks associated with continued smoking, the benefits of smoking cessation and strategies that might be helpful. After thorough consideration, she wishes to continue her efforts at smoking cessation without medical assistance. Upon follow-up, we can discuss initiating Chantix therapy.  Stiolto inhaler prescribed - 2 inhalations daily. Sample provided. Rx entered   R0V 6 weeks with PFTs prior   Merton Border, MD PCCM service Mobile [phone] Pager [phone] 06/15/2017 2:56 PM

## 2017-06-19 DIAGNOSIS — M48062 Spinal stenosis, lumbar region with neurogenic claudication: Secondary | ICD-10-CM | POA: Diagnosis not present

## 2017-06-19 DIAGNOSIS — M545 Low back pain: Secondary | ICD-10-CM | POA: Diagnosis not present

## 2017-06-19 DIAGNOSIS — S335XXA Sprain of ligaments of lumbar spine, initial encounter: Secondary | ICD-10-CM | POA: Diagnosis not present

## 2017-07-04 ENCOUNTER — Telehealth: Payer: Self-pay | Admitting: Internal Medicine

## 2017-07-04 NOTE — Telephone Encounter (Signed)
Left message, pt is eligible to schedule for Welcome to Medicare Visit / lt

## 2017-07-29 ENCOUNTER — Ambulatory Visit: Payer: Medicare Other | Attending: Pulmonary Disease

## 2017-07-29 DIAGNOSIS — J439 Emphysema, unspecified: Secondary | ICD-10-CM | POA: Diagnosis not present

## 2017-07-29 DIAGNOSIS — F17200 Nicotine dependence, unspecified, uncomplicated: Secondary | ICD-10-CM

## 2017-07-29 DIAGNOSIS — J42 Unspecified chronic bronchitis: Secondary | ICD-10-CM

## 2017-07-29 MED ORDER — ALBUTEROL SULFATE (2.5 MG/3ML) 0.083% IN NEBU
2.5000 mg | INHALATION_SOLUTION | Freq: Once | RESPIRATORY_TRACT | Status: AC
  Administered 2017-07-29: 2.5 mg via RESPIRATORY_TRACT
  Filled 2017-07-29: qty 3

## 2017-08-04 ENCOUNTER — Ambulatory Visit (INDEPENDENT_AMBULATORY_CARE_PROVIDER_SITE_OTHER): Payer: Medicare Other | Admitting: Pulmonary Disease

## 2017-08-04 ENCOUNTER — Encounter: Payer: Self-pay | Admitting: Pulmonary Disease

## 2017-08-04 VITALS — BP 130/80 | HR 121 | Ht 65.0 in | Wt 119.0 lb

## 2017-08-04 DIAGNOSIS — J439 Emphysema, unspecified: Secondary | ICD-10-CM | POA: Diagnosis not present

## 2017-08-04 DIAGNOSIS — F17200 Nicotine dependence, unspecified, uncomplicated: Secondary | ICD-10-CM

## 2017-08-04 DIAGNOSIS — J449 Chronic obstructive pulmonary disease, unspecified: Secondary | ICD-10-CM

## 2017-08-04 DIAGNOSIS — J410 Simple chronic bronchitis: Secondary | ICD-10-CM | POA: Diagnosis not present

## 2017-08-04 MED ORDER — VARENICLINE TARTRATE 0.5 MG X 11 & 1 MG X 42 PO MISC: ORAL | 0 refills | Status: DC

## 2017-08-04 MED ORDER — VARENICLINE TARTRATE 1 MG PO TABS: 1.0000 mg | ORAL_TABLET | Freq: Two times a day (BID) | ORAL | 5 refills | Status: DC

## 2017-08-04 NOTE — Patient Instructions (Signed)
Stop Flovent inhaler Continue Stiolto inhaler as previously prescribed Smoking cessation was discussed in detail Begin Chantix - prescriptions for both starter pack and continuation pack have been entered. Only fill the starter pack for now.  Follow-up in 8-10 weeks

## 2017-08-05 NOTE — Progress Notes (Signed)
PULMONARY OFFICE FOLLOW-UP NOTE  Requesting MD/Service: Self referred Date of initial consultation: 06/12/17 Reason for consultation: COPD  PT PROFILE: 66 y.o. female smoker with recent diagnosis of COPD after a flulike illness  DATA: 07/29/17 PFTs: Severe obstruction (FEV1 1.19 L, 49%), hyperinflation and gas trapping  - TLC 6.80 L (135% pred), RV 3.78 L (192%), DLCO 50% predicted  SUBJ:  This is a routine follow-up. Last visit, I initiated Stiolto inhaler and counseled regarding smoking cessation. She returns today with no new complaints. She continues to smoke one half pack cigarettes per day. She is only using the Stiolto "as needed". She is unable to discern significant improvement. She continues to have persistent rattling cough. She is here to review recent PFTs. She denies chest pain, hemoptysis, purulent sputum, lower extremity edema, calf tenderness.   Vitals:   08/04/17 1414 08/04/17 1416  BP:  130/80  Pulse:  (!) 121  SpO2:  94%  Weight: 54 kg (119 lb)   Height: 5\' 5"  (1.651 m)   RA  EXAM:  Gen: NAD, frequent rattling cough and raspy voice quality HEENT: WNL Neck: Supple without LAN, JVD Lungs: breath sounds diffusely diminished and coarse without wheezes Cardiovascular: Tachycardia, regular, no murmurs noted Abdomen: Soft, nontender, normal BS Ext: without clubbing, cyanosis, edema Neuro: No focal deficits  DATA:   BMP Latest Ref Rng & Units 05/17/2017 10/01/2016 03/09/2015  Glucose 65 - 99 mg/dL 155(H) 89 71  BUN 6 - 20 mg/dL 11 12 9   Creatinine 0.44 - 1.00 mg/dL 0.70 0.75 0.74  BUN/Creat Ratio 12 - 28 - 16 12  Sodium 135 - 145 mmol/L 138 142 143  Potassium 3.5 - 5.1 mmol/L 3.7 4.6 4.1  Chloride 101 - 111 mmol/L 99(L) 98 99  CO2 22 - 32 mmol/L 28 27 25   Calcium 8.9 - 10.3 mg/dL 9.9 9.8 9.4    CBC Latest Ref Rng & Units 05/17/2017 10/01/2016 03/09/2015  WBC 3.6 - 11.0 K/uL 11.8(H) 11.3(H) 9.5  Hemoglobin 12.0 - 16.0 g/dL 17.2(H) 17.7(H) 16.2(H)  Hematocrit  35.0 - 47.0 % 50.7(H) 50.8(H) 48.8(H)  Platelets 150 - 440 K/uL 251 275 249    CXR:  NNF  IMPRESSION:     ICD-10-CM   1. COPD, severe ([place]) J44.9   2. Pulmonary emphysema, unspecified emphysema type ([place]) J43.9   3. Simple chronic bronchitis ([facility]) J41.0   4. Smoker F17.200      PLAN:  Smoking cessation was Again discussed in detail including the risks associated with continued smoking, the benefits of smoking cessation and strategies that might be helpful. After thorough consideration, she wishes to continue her efforts at smoking cessation without medical assistance. She wishes to initiate Chantix therapy.  1) Chantix starter pack and continuation pack have been prescribed 2) continue Stiolto inhaler - I recommended daily use. Stop Flovent inhaler 3) continue albuterol rescue inhaler   R0V 8-10 weeks    Merton Border, MD PCCM service Mobile [phone] Pager [phone] 08/05/2017 12:53 PM

## 2017-10-08 ENCOUNTER — Telehealth: Payer: Self-pay | Admitting: Pulmonary Disease

## 2017-10-08 ENCOUNTER — Other Ambulatory Visit: Payer: Self-pay | Admitting: *Deleted

## 2017-10-08 MED ORDER — TIOTROPIUM BROMIDE-OLODATEROL 2.5-2.5 MCG/ACT IN AERS: 2.0000 | INHALATION_SPRAY | Freq: Every day | RESPIRATORY_TRACT | 3 refills | Status: DC

## 2017-10-08 NOTE — Telephone Encounter (Signed)
Pt had received samples of stiolto respimat at last visit She has ran out and does not see Dr Alva Garnet until 1/2 Pt would like to know if she will need a prescription to continue using or wait until she sees doctor Please call to advise

## 2017-10-08 NOTE — Progress Notes (Signed)
Patient states inhaler did help so rx has been sent to pharmacy.

## 2017-10-09 ENCOUNTER — Telehealth: Payer: Self-pay | Admitting: Pulmonary Disease

## 2017-10-09 MED ORDER — TIOTROPIUM BROMIDE-OLODATEROL 2.5-2.5 MCG/ACT IN AERS: 2.0000 | INHALATION_SPRAY | Freq: Every day | RESPIRATORY_TRACT | 3 refills | Status: DC

## 2017-10-09 NOTE — Telephone Encounter (Signed)
Pt does not have insurance and Stioloto is $525.  We have Breo 100 and 200 and Arnuity 100 and 200 that we can give samples if you agree.

## 2017-10-09 NOTE — Telephone Encounter (Signed)
Received fax from [facility] that they could not get Stiolto from [facility] Called patient to see what other pharmacy she would like to use. Patient states [facility] on [address] and they do have Stiolto. Rx sent and patient notified pharmacy change. Nothing further needed.

## 2017-10-09 NOTE — Telephone Encounter (Signed)
Pt states her rx for hte Stilolto is $525 . She would like to discuss an alternative.

## 2017-10-16 NOTE — Telephone Encounter (Signed)
LMOM for pt to return call. 

## 2017-10-16 NOTE — Telephone Encounter (Signed)
Can use Breo 100 until pt gets insurance in January and then needs to go back to [facility] .

## 2017-10-17 NOTE — Telephone Encounter (Signed)
Pt returning our call °Please call back ° °

## 2017-10-17 NOTE — Telephone Encounter (Signed)
LMOVM for pt to return call 

## 2017-10-17 NOTE — Telephone Encounter (Signed)
Spoke with pt and she states with her new insurance Stioloto is not covered but she will call before her appt In January and see what is covered in its place. Samples left up front for pt. Nothing further needed.

## 2017-10-29 ENCOUNTER — Encounter: Payer: Self-pay | Admitting: Pulmonary Disease

## 2017-10-29 ENCOUNTER — Ambulatory Visit (INDEPENDENT_AMBULATORY_CARE_PROVIDER_SITE_OTHER): Payer: Medicare Other | Admitting: Pulmonary Disease

## 2017-10-29 VITALS — BP 102/70 | HR 109 | Resp 16 | Ht 65.0 in | Wt 129.0 lb

## 2017-10-29 DIAGNOSIS — F17200 Nicotine dependence, unspecified, uncomplicated: Secondary | ICD-10-CM

## 2017-10-29 DIAGNOSIS — J449 Chronic obstructive pulmonary disease, unspecified: Secondary | ICD-10-CM | POA: Diagnosis not present

## 2017-10-29 MED ORDER — TIOTROPIUM BROMIDE MONOHYDRATE 1.25 MCG/ACT IN AERS: 1.0000 | INHALATION_SPRAY | Freq: Every day | RESPIRATORY_TRACT | 10 refills | Status: DC

## 2017-10-29 MED ORDER — VARENICLINE TARTRATE 0.5 MG X 11 & 1 MG X 42 PO MISC: ORAL | 0 refills | Status: DC

## 2017-10-29 NOTE — Patient Instructions (Signed)
Smoking cessation as we discussed Chantix starter pack refilled Discontinue Stiolto Begin Spiriva Respimat inhaler -prescription entered Keep the Breo inhaler around in case we go back to that medication Follow-up in 2-3 months

## 2017-10-29 NOTE — Progress Notes (Signed)
PULMONARY OFFICE FOLLOW-UP NOTE  Requesting MD/Service: Self referred Date of initial consultation: 06/12/17 Reason for consultation: COPD  PT PROFILE: 67 y.o. female smoker with recent diagnosis of COPD after a flulike illness  DATA: 07/29/17 PFTs: Severe obstruction (FEV1 1.19 L, 49%), hyperinflation and gas trapping  - TLC 6.80 L (135% pred), RV 3.78 L (192%), DLCO 50% predicted  SUBJ:  This is a routine follow-up. Last visit, I initiated Stiolto inhaler and counseled regarding smoking cessation. She returns today with no new complaints. She continues to smoke one half pack cigarettes per day. She is only using the Stiolto "as needed". She is unable to discern significant improvement. She continues to have persistent rattling cough. She is here to review recent PFTs. She denies chest pain, hemoptysis, purulent sputum, lower extremity edema, calf tenderness.   Vitals:   10/29/17 1328 10/29/17 1334  BP:  102/70  Pulse:  (!) 109  Resp: 16   SpO2:  93%  Weight: 58.5 kg (129 lb)   Height: 5\' 5"  (1.651 m)   RA  EXAM:  Gen: NAD, frequent rattling cough and raspy voice quality HEENT: WNL Neck: Supple without LAN, JVD Lungs: breath sounds diffusely diminished and coarse without wheezes Cardiovascular: Tachycardia, regular, no murmurs noted Abdomen: Soft, nontender, normal BS Ext: without clubbing, cyanosis, edema Neuro: No focal deficits  DATA:   BMP Latest Ref Rng & Units 05/17/2017 10/01/2016 03/09/2015  Glucose 65 - 99 mg/dL 155(H) 89 71  BUN 6 - 20 mg/dL 11 12 9   Creatinine 0.44 - 1.00 mg/dL 0.70 0.75 0.74  BUN/Creat Ratio 12 - 28 - 16 12  Sodium 135 - 145 mmol/L 138 142 143  Potassium 3.5 - 5.1 mmol/L 3.7 4.6 4.1  Chloride 101 - 111 mmol/L 99(L) 98 99  CO2 22 - 32 mmol/L 28 27 25   Calcium 8.9 - 10.3 mg/dL 9.9 9.8 9.4    CBC Latest Ref Rng & Units 05/17/2017 10/01/2016 03/09/2015  WBC 3.6 - 11.0 K/uL 11.8(H) 11.3(H) 9.5  Hemoglobin 12.0 - 16.0 g/dL 17.2(H) 17.7(H) 16.2(H)   Hematocrit 35.0 - 47.0 % 50.7(H) 50.8(H) 48.8(H)  Platelets 150 - 440 K/uL 251 275 249    CXR:  NNF  IMPRESSION:     ICD-10-CM   1. Stage 3 severe COPD by GOLD classification ([place]) J44.9   2. Smoker F17.200      PLAN:  Smoking cessation was Again discussed in detail including the risks associated with continued smoking, the benefits of smoking cessation and strategies that might be helpful. After thorough consideration, she wishes to continue her efforts at smoking cessation without medical assistance. She wishes to initiate Chantix therapy.  1) Chantix starter pack and continuation pack have been prescribed 2) continue Stiolto inhaler - I recommended daily use. Stop Flovent inhaler 3) continue albuterol rescue inhaler PULMONARY OFFICE FOLLOW-UP NOTE  Requesting MD/Service: Self referred Date of initial consultation: 06/12/17 Reason for consultation: COPD  PT PROFILE: 67 y.o. female smoker with recent diagnosis of COPD after a flulike illness  DATA: 07/29/17 PFTs: Severe obstruction (FEV1 1.19 L, 49%), hyperinflation and gas trapping  - TLC 6.80 L (135% pred), RV 3.78 L (192%), DLCO 50% predicted  SUBJ:  This is a routine follow-up. Last visit, Chantix was prescribed.  She took the starter pack but lost her insurance coverage and could not afford the refills.  She now again has insurance coverage.  She returns today with no new complaints. She continues to smoke one half pack cigarettes per day.  He also could not afford the Stiolto inhaler previously prescribed.  We provided her with samples of Brio inhaler which she believes was of modest benefit. She denies chest pain, hemoptysis, purulent sputum, lower extremity edema, calf tenderness.   Vitals:   10/29/17 1328 10/29/17 1334  BP:  102/70  Pulse:  (!) 109  Resp: 16   SpO2:  93%  Weight: 58.5 kg (129 lb)   Height: 5\' 5"  (1.651 m)   RA  EXAM:  Gen: NAD, frequent rattling cough and raspy voice quality HEENT:  WNL Neck: Supple without LAN, JVD Lungs: breath sounds mildly diminished without wheezes Cardiovascular: Tachycardia, regular, no murmurs noted Abdomen: Soft, nontender, normal BS Ext: without clubbing, cyanosis, edema Neuro: No focal deficits  DATA:   BMP Latest Ref Rng & Units 05/17/2017 10/01/2016 03/09/2015  Glucose 65 - 99 mg/dL 155(H) 89 71  BUN 6 - 20 mg/dL 11 12 9   Creatinine 0.44 - 1.00 mg/dL 0.70 0.75 0.74  BUN/Creat Ratio 12 - 28 - 16 12  Sodium 135 - 145 mmol/L 138 142 143  Potassium 3.5 - 5.1 mmol/L 3.7 4.6 4.1  Chloride 101 - 111 mmol/L 99(L) 98 99  CO2 22 - 32 mmol/L 28 27 25   Calcium 8.9 - 10.3 mg/dL 9.9 9.8 9.4    CBC Latest Ref Rng & Units 05/17/2017 10/01/2016 03/09/2015  WBC 3.6 - 11.0 K/uL 11.8(H) 11.3(H) 9.5  Hemoglobin 12.0 - 16.0 g/dL 17.2(H) 17.7(H) 16.2(H)  Hematocrit 35.0 - 47.0 % 50.7(H) 50.8(H) 48.8(H)  Platelets 150 - 440 K/uL 251 275 249    CXR:  NNF  IMPRESSION:     ICD-10-CM   1. Stage 3 severe COPD by GOLD classification ([place]) J44.9   2. Smoker F17.200      PLAN:  Smoking cessation was again discussed Chantix starter pack was refilled After completion of the starter pack, she is to fill and use the continuation pack of Chantix Discontinue Stiolto inhaler due to cost Spiriva Respimat inhaler prescribed Hold Breo for now Follow-up in 2-3 months  Merton Border, MD PCCM service Mobile [phone] Pager [phone] 10/29/2017 1:50 PM

## 2018-03-06 DIAGNOSIS — M7502 Adhesive capsulitis of left shoulder: Secondary | ICD-10-CM | POA: Diagnosis not present

## 2018-03-17 ENCOUNTER — Encounter: Payer: Self-pay | Admitting: Pulmonary Disease

## 2018-03-17 ENCOUNTER — Ambulatory Visit (INDEPENDENT_AMBULATORY_CARE_PROVIDER_SITE_OTHER): Payer: Medicare Other | Admitting: Pulmonary Disease

## 2018-03-17 VITALS — BP 108/78 | HR 111 | Ht 65.0 in

## 2018-03-17 DIAGNOSIS — J449 Chronic obstructive pulmonary disease, unspecified: Secondary | ICD-10-CM

## 2018-03-17 DIAGNOSIS — F17200 Nicotine dependence, unspecified, uncomplicated: Secondary | ICD-10-CM

## 2018-03-17 MED ORDER — GLYCOPYRROLATE-FORMOTEROL 9-4.8 MCG/ACT IN AERO: 2.0000 | INHALATION_SPRAY | Freq: Two times a day (BID) | RESPIRATORY_TRACT | 0 refills | Status: DC

## 2018-03-17 MED ORDER — GLYCOPYRROLATE-FORMOTEROL 9-4.8 MCG/ACT IN AERO: 2.0000 | INHALATION_SPRAY | Freq: Two times a day (BID) | RESPIRATORY_TRACT | 3 refills | Status: DC

## 2018-03-17 NOTE — Addendum Note (Signed)
Addended by: Stephanie Coup on: 03/17/2018 11:54 AM   Modules accepted: Orders

## 2018-03-17 NOTE — Addendum Note (Signed)
Addended by: Stephanie Coup on: 03/17/2018 11:57 AM   Modules accepted: Orders

## 2018-03-17 NOTE — Addendum Note (Signed)
Addended by: Stephanie Coup on: 03/17/2018 12:19 PM   Modules accepted: Orders

## 2018-03-17 NOTE — Patient Instructions (Signed)
We discussed smoking cessation again and I encouraged complete abstinence from cigarette  We discussed lung cancer screening and you are to contact our office if you want to proceed with this  We will find you an alternative to Stiolto that is covered by your plan  Continue albuterol inhaler as needed for increased shortness of breath, wheezing, chest tightness, cough  Follow-up in 6 months or sooner as needed

## 2018-03-17 NOTE — Progress Notes (Signed)
PULMONARY OFFICE FOLLOW-UP NOTE  Requesting MD/Service: Self referred Date of initial consultation: 06/12/17 Reason for consultation: COPD  PT PROFILE: 67 y.o. female smoker with recent diagnosis of COPD after a flulike illness  DATA: 07/29/17 PFTs: Mod-severe obstruction (FEV1 1.19 L, 49%), hyperinflation and gas trapping  - TLC 6.80 L (135% pred), RV 3.78 L (192%), DLCO 50% predicted  INTERVAL: Last seen 10/29/17.   SUBJ:  This is a routine follow-up. Last visit, we initiated Chantix.  She was unable to tolerate this due to nausea.  Nonetheless, she has all but quit smoking.  She smokes an occasional cigarette from time to time when she "bums" one from her neighbor.  She describes minimal shortness of breath.  She denies CP, fever, purulent sputum, hemoptysis, LE edema and calf tenderness   Vitals:   03/17/18 1117 03/17/18 1121  BP:  108/78  Pulse:  (!) 111  SpO2:  96%  Height: 5\' 5"  (1.651 m)   RA  EXAM:  Gen: NAD HEENT: NCAT, sclera white Neck: No JVD Lungs: breath sounds mildly diminished, no wheezes or other adventitious sounds Cardiovascular: RRR, no murmurs Abdomen: Soft, nontender, normal BS Ext: without clubbing, cyanosis, edema Neuro: grossly intact Skin: Limited exam, no lesions noted   DATA:   BMP Latest Ref Rng & Units 05/17/2017 10/01/2016 03/09/2015  Glucose 65 - 99 mg/dL 155(H) 89 71  BUN 6 - 20 mg/dL 11 12 9   Creatinine 0.44 - 1.00 mg/dL 0.70 0.75 0.74  BUN/Creat Ratio 12 - 28 - 16 12  Sodium 135 - 145 mmol/L 138 142 143  Potassium 3.5 - 5.1 mmol/L 3.7 4.6 4.1  Chloride 101 - 111 mmol/L 99(L) 98 99  CO2 22 - 32 mmol/L 28 27 25   Calcium 8.9 - 10.3 mg/dL 9.9 9.8 9.4    CBC Latest Ref Rng & Units 05/17/2017 10/01/2016 03/09/2015  WBC 3.6 - 11.0 K/uL 11.8(H) 11.3(H) 9.5  Hemoglobin 12.0 - 16.0 g/dL 17.2(H) 17.7(H) 16.2(H)  Hematocrit 35.0 - 47.0 % 50.7(H) 50.8(H) 48.8(H)  Platelets 150 - 440 K/uL 251 275 249    CXR: No new film  IMPRESSION:    ICD-10-CM   1. Smoker F17.200   2. COPD, severe ([place]) J44.9     PLAN:  We discussed smoking cessation again and I encouraged complete abstinence from cigarettes We discussed lung cancer screening and she is to contact our office if she wants to proceed We will seek an alternative to Stiolto that is covered by her plan Continue albuterol inhaler as needed for increased shortness of breath, wheezing, chest tightness, cough Follow-up in 6 months or sooner as needed   Merton Border, MD PCCM service Mobile [phone] Pager [phone] 03/17/2018 11:23 AM

## 2018-03-27 DIAGNOSIS — H5310 Unspecified subjective visual disturbances: Secondary | ICD-10-CM | POA: Diagnosis not present

## 2018-03-27 DIAGNOSIS — G43109 Migraine with aura, not intractable, without status migrainosus: Secondary | ICD-10-CM | POA: Diagnosis not present

## 2018-03-27 DIAGNOSIS — H2513 Age-related nuclear cataract, bilateral: Secondary | ICD-10-CM | POA: Diagnosis not present

## 2018-03-27 DIAGNOSIS — H25013 Cortical age-related cataract, bilateral: Secondary | ICD-10-CM | POA: Diagnosis not present

## 2018-04-03 ENCOUNTER — Telehealth: Payer: Self-pay | Admitting: Pulmonary Disease

## 2018-04-03 MED ORDER — GLYCOPYRROLATE-FORMOTEROL 9-4.8 MCG/ACT IN AERO
2.0000 | INHALATION_SPRAY | Freq: Two times a day (BID) | RESPIRATORY_TRACT | 5 refills | Status: DC
Start: 2018-04-03 — End: 2018-10-29

## 2018-04-03 MED ORDER — ALBUTEROL SULFATE HFA 108 (90 BASE) MCG/ACT IN AERS: 1.0000 | INHALATION_SPRAY | Freq: Four times a day (QID) | RESPIRATORY_TRACT | 2 refills | Status: DC | PRN

## 2018-04-03 NOTE — Telephone Encounter (Signed)
°*  STAT* If patient is at the pharmacy, call can be transferred to refill team.   1. Which medications need to be refilled? (please list name of each medication and dose if known)     Bevespi INH 9-4.8 mcg/act aero 2 puffs BID    Albuterol INH 108 1-2 puffs q 6 h PRN  2. Which pharmacy/location (including street and city if local pharmacy) is medication to be sent to? [address] [place]   3. Do they need a 30 day or 90 day supply? [place]

## 2018-04-03 NOTE — Telephone Encounter (Signed)
RX refill submitted.ss

## 2018-04-14 ENCOUNTER — Telehealth: Payer: Self-pay | Admitting: Pulmonary Disease

## 2018-04-14 MED ORDER — AZITHROMYCIN 250 MG PO TABS: ORAL_TABLET | ORAL | 0 refills | Status: AC

## 2018-04-14 NOTE — Telephone Encounter (Signed)
Please give z pak

## 2018-04-14 NOTE — Telephone Encounter (Signed)
Pt states she has had some congestion and her nose is running and cough with mucous. She thinks she may need an antibiotic. Please call to discuss

## 2018-04-14 NOTE — Telephone Encounter (Signed)
zpak sent Patient notified

## 2018-05-05 ENCOUNTER — Telehealth: Payer: Self-pay | Admitting: Pulmonary Disease

## 2018-05-05 NOTE — Telephone Encounter (Signed)
Pt calling needing to know if we received her [employer] paper work   Please call back

## 2018-05-06 NOTE — Telephone Encounter (Signed)
Informed pt we haven't received FMLA forms. She will refax. Once received will fax to [facility].

## 2018-05-06 NOTE — Telephone Encounter (Signed)
Received FMLA forms and faxed the to [facility]. Nothing further needed at this time.

## 2018-05-08 NOTE — Telephone Encounter (Signed)
Left detailed message that FMLA paperwork takes several weeks to process. If she needs to contact office back she may. Forms have already been sent to [facility]. Nothing furhter needed.

## 2018-05-08 NOTE — Telephone Encounter (Signed)
Please call pt regarding [employer] forms. Pt did leave signed release form , placed in Pulmonary inbox

## 2018-05-11 NOTE — Telephone Encounter (Signed)
Received ROI and $25 check.  Patient was directed by [employer] to drop off at office to have mailed.  Placed in interoffice mail .

## 2018-05-25 NOTE — Telephone Encounter (Signed)
Jennie at [employer] calling to check status of forms to be completed.  Please call.

## 2018-05-26 NOTE — Telephone Encounter (Signed)
Returned call to [place] notified [employer] forms should be signed 05/28/2018. They are in provider folder awaiting signature.

## 2018-05-27 NOTE — Telephone Encounter (Signed)
Forms placed in outgoing interoffice mail to go back to [employer].

## 2018-06-19 NOTE — Telephone Encounter (Signed)
Received additional paperwork from [facility] for provider to review. Placed in [place].

## 2018-06-19 NOTE — Telephone Encounter (Signed)
[employer] folder placed in MD folder Dr. Alva Garnet once completed place in courier basket. Nothing further needed.

## 2018-06-25 NOTE — Telephone Encounter (Signed)
[employer] calling to check status of signature and return .  Please call for update Jennie at [employer] [phone].

## 2018-06-25 NOTE — Telephone Encounter (Signed)
Paperwork in route via courier. Tawanna Sat at [facility] request it be faxed also to [phone]. Confirmation received.

## 2018-07-07 DIAGNOSIS — M545 Low back pain: Secondary | ICD-10-CM | POA: Diagnosis not present

## 2018-07-07 DIAGNOSIS — M419 Scoliosis, unspecified: Secondary | ICD-10-CM | POA: Diagnosis not present

## 2018-07-07 DIAGNOSIS — M5136 Other intervertebral disc degeneration, lumbar region: Secondary | ICD-10-CM | POA: Diagnosis not present

## 2018-08-25 ENCOUNTER — Ambulatory Visit (INDEPENDENT_AMBULATORY_CARE_PROVIDER_SITE_OTHER): Payer: Medicare Other | Admitting: Internal Medicine

## 2018-08-25 ENCOUNTER — Encounter: Payer: Self-pay | Admitting: Internal Medicine

## 2018-08-25 VITALS — BP 130/74 | HR 91 | Temp 98.2°F | Ht 64.0 in | Wt 130.0 lb

## 2018-08-25 DIAGNOSIS — J449 Chronic obstructive pulmonary disease, unspecified: Secondary | ICD-10-CM

## 2018-08-25 DIAGNOSIS — Z0000 Encounter for general adult medical examination without abnormal findings: Secondary | ICD-10-CM

## 2018-08-25 DIAGNOSIS — Z23 Encounter for immunization: Secondary | ICD-10-CM

## 2018-08-25 DIAGNOSIS — R0989 Other specified symptoms and signs involving the circulatory and respiratory systems: Secondary | ICD-10-CM | POA: Diagnosis not present

## 2018-08-25 DIAGNOSIS — M5136 Other intervertebral disc degeneration, lumbar region: Secondary | ICD-10-CM

## 2018-08-25 DIAGNOSIS — Z780 Asymptomatic menopausal state: Secondary | ICD-10-CM | POA: Diagnosis not present

## 2018-08-25 DIAGNOSIS — E782 Mixed hyperlipidemia: Secondary | ICD-10-CM | POA: Diagnosis not present

## 2018-08-25 DIAGNOSIS — E0590 Thyrotoxicosis, unspecified without thyrotoxic crisis or storm: Secondary | ICD-10-CM | POA: Diagnosis not present

## 2018-08-25 DIAGNOSIS — M51369 Other intervertebral disc degeneration, lumbar region without mention of lumbar back pain or lower extremity pain: Secondary | ICD-10-CM

## 2018-08-25 NOTE — Assessment & Plan Note (Signed)
Continue Flexeril prn

## 2018-08-25 NOTE — Progress Notes (Signed)
HPI:  Pt presents to the clinic today for her Medicare Wellness Exam. She is also due to follow up chronic conditions.  COPD: She does continue to smoke. She uses Albuterol prn. She follows with Dr. Alva Garnet.  DDD, Lumbar: She takes Ibuprofen and Flexeril as needed.   Hyperthyroidism: She is not taking any medication for this. She does not follow with endocrinology.  Past Medical History:  Diagnosis Date  . Abnormal EKG    HX OF PREVIOUS EKGS SHOWING POSSIBLE MI--BUT PT HAS NEVER HAD ANY HEART PROBLEMS--HAD BACK SURGERY AUG 2012 AT [facility] - NO HEART PROBLEMS  . Chronic cough    PT IS A SMOKER  . DDD (degenerative disc disease), lumbar    PAIN IN LOWER BACK AND DOWN RT HIP AND LEG--NUMBNESS, THROBBING, BURNING  . Hyperthyroidism 02/20/2015    Current Outpatient Medications  Medication Sig Dispense Refill  . albuterol (PROVENTIL HFA;VENTOLIN HFA) 108 (90 Base) MCG/ACT inhaler Inhale 1-2 puffs into the lungs every 6 (six) hours as needed for wheezing or shortness of breath. 18 g 2  . Ascorbic Acid (VITAMIN C) 1000 MG tablet Take 1,000 mg by mouth daily.    . Biotin 10 MG TABS Take 1 tablet by mouth daily.    . cyclobenzaprine (FLEXERIL) 10 MG tablet Take 1 tablet (10 mg total) by mouth 3 (three) times daily as needed for muscle spasms. 30 tablet 0  . Glycopyrrolate-Formoterol (BEVESPI AEROSPHERE) 9-4.8 MCG/ACT AERO Inhale 2 puffs into the lungs 2 (two) times daily. 10.7 g 5  . ibuprofen (ADVIL,MOTRIN) 800 MG tablet Take 1 tablet (800 mg total) by mouth every 8 (eight) hours as needed. 30 tablet 0  . Multiple Vitamins-Minerals (HAIR/SKIN/NAILS PO) Take 1 capsule by mouth daily.    . Tiotropium Bromide Monohydrate (SPIRIVA RESPIMAT) 1.25 MCG/ACT AERS Inhale 1 puff into the lungs daily. 1 Inhaler 10   No current facility-administered medications for this visit.     No Known Allergies  Family History  Problem Relation Age of Onset  . Heart disease Mother   . Heart disease Father   .  Stroke Maternal Grandmother   . Diabetes Maternal Grandmother     Social History   Socioeconomic History  . Marital status: Single    Spouse name: Not on file  . Number of children: Not on file  . Years of education: Not on file  . Highest education level: Not on file  Occupational History  . Not on file  Social Needs  . Financial resource strain: Not on file  . Food insecurity:    Worry: Not on file    Inability: Not on file  . Transportation needs:    Medical: Not on file    Non-medical: Not on file  Tobacco Use  . Smoking status: Current Every Day Smoker    Packs/day: 0.50    Years: 30.00    Pack years: 15.00    Types: Cigarettes  . Smokeless tobacco: Never Used  Substance and Sexual Activity  . Alcohol use: Yes    Alcohol/week: 0.0 standard drinks    Comment: rare  . Drug use: No  . Sexual activity: Not Currently  Lifestyle  . Physical activity:    Days per week: Not on file    Minutes per session: Not on file  . Stress: Not on file  Relationships  . Social connections:    Talks on phone: Not on file    Gets together: Not on file    Attends religious  service: Not on file    Active member of club or organization: Not on file    Attends meetings of clubs or organizations: Not on file    Relationship status: Not on file  . Intimate partner violence:    Fear of current or ex partner: Not on file    Emotionally abused: Not on file    Physically abused: Not on file    Forced sexual activity: Not on file  Other Topics Concern  . Not on file  Social History Narrative  . Not on file    Hospitiliaztions: None  Health Maintenance:    Flu: 08/2016  Tetanus: unsure  Pneumovax: never  Prevnar: never  Shingrix: never  Mammogram: > 5 years ago  Pap Smear: 02/2015  Bone Density: never  Colon Screening: never  Eye Doctor: annually  Dental Exam: biannually   Providers:   PCP: Webb Silversmith, Np-C  Pulmonologist: Dr. Alva Garnet   I have personally reviewed  and have noted:  1. The patient's medical and social history 2. Their use of alcohol, tobacco or illicit drugs 3. Their current medications and supplements 4. The patient's functional ability including ADL's, fall risks, home safety risks and hearing or visual impairment. 5. Diet and physical activities 6. Evidence for depression or mood disorder  Subjective:   Review of Systems:   Constitutional: Denies fever, malaise, fatigue, headache or abrupt weight changes.  HEENT: Denies eye pain, eye redness, ear pain, ringing in the ears, wax buildup, runny nose, nasal congestion, bloody nose, or sore throat. Respiratory: Pt reports intermittent cough. Denies difficulty breathing, shortness of breath, or sputum production.   Cardiovascular: Denies chest pain, chest tightness, palpitations or swelling in the hands or feet.  Gastrointestinal: Denies abdominal pain, bloating, constipation, diarrhea or blood in the stool.  GU: Denies urgency, frequency, pain with urination, burning sensation, blood in urine, odor or discharge. Musculoskeletal: Denies decrease in range of motion, difficulty with gait, muscle pain or joint pain and swelling.  Skin: Denies redness, rashes, lesions or ulcercations.  Neurological: Denies dizziness, difficulty with memory, difficulty with speech or problems with balance and coordination.  Psych: Denies anxiety, depression, SI/HI.  No other specific complaints in a complete review of systems (except as listed in HPI above).  Objective:  PE:   BP 130/74   Pulse 91   Temp 98.2 F (36.8 C) (Oral)   Ht 5\' 4"  (1.626 m)   Wt 130 lb (59 kg)   SpO2 93%   BMI 22.31 kg/m   Wt Readings from Last 3 Encounters:  10/29/17 129 lb (58.5 kg)  08/04/17 119 lb (54 kg)  06/12/17 118 lb (53.5 kg)    General: Appears her stated age, well developed, well nourished in NAD. Skin: Warm, dry and intact.  HEENT: Head: normal shape and size; Eyes: sclera white, no icterus,  conjunctiva pink, PERRLA and EOMs intact; Ears: Tm's gray and intact, normal light reflex; Throat/Mouth: Teeth present, mucosa pink and moist, no exudate, lesions or ulcerations noted.  Neck: Neck supple, trachea midline. No masses, lumps or thyromegaly present.  Cardiovascular: Normal rate and rhythm. S1,S2 noted.  No murmur, rubs or gallops noted. No JVD or BLE edema. Right carotid bruit noted. Pulmonary/Chest: Normal effort and positive vesicular breath sounds. No respiratory distress. No wheezes, rales or ronchi noted.  Abdomen: Soft and nontender. Normal bowel sounds. No distention or masses noted. Liver, spleen and kidneys non palpable. Musculoskeletal:  Strength 5/5 BUE/BLE. No signs of joint swelling.  Neurological:  Alert and oriented. Cranial nerves II-XII grossly intact. Coordination normal.  Psychiatric: Mood and affect normal. Behavior is normal. Judgment and thought content normal.   BMET    Component Value Date/Time   NA 138 05/17/2017 1240   NA 142 10/01/2016 1412   K 3.7 05/17/2017 1240   CL 99 (L) 05/17/2017 1240   CO2 28 05/17/2017 1240   GLUCOSE 155 (H) 05/17/2017 1240   BUN 11 05/17/2017 1240   BUN 12 10/01/2016 1412   CREATININE 0.70 05/17/2017 1240   CALCIUM 9.9 05/17/2017 1240   GFRNONAA >60 05/17/2017 1240   GFRAA >60 05/17/2017 1240    Lipid Panel     Component Value Date/Time   CHOL 239 (H) 10/01/2016 1412   TRIG 95 10/01/2016 1412   HDL 47 10/01/2016 1412   CHOLHDL 5.1 (H) 10/01/2016 1412   LDLCALC 173 (H) 10/01/2016 1412    CBC    Component Value Date/Time   WBC 11.8 (H) 05/17/2017 1240   RBC 5.64 (H) 05/17/2017 1240   HGB 17.2 (H) 05/17/2017 1240   HGB 17.7 (H) 10/01/2016 1412   HCT 50.7 (H) 05/17/2017 1240   HCT 50.8 (H) 10/01/2016 1412   PLT 251 05/17/2017 1240   PLT 275 10/01/2016 1412   MCV 89.8 05/17/2017 1240   MCV 93 10/01/2016 1412   MCH 30.5 05/17/2017 1240   MCHC 34.0 05/17/2017 1240   RDW 13.9 05/17/2017 1240   RDW 13.0  10/01/2016 1412   LYMPHSABS 4.0 05/15/2012 1525   MONOABS 0.8 05/15/2012 1525   EOSABS 0.3 05/15/2012 1525   BASOSABS 0.1 05/15/2012 1525    Hgb A1C Lab Results  Component Value Date   HGBA1C 5.9 (H) 10/01/2016      Assessment and Plan:   Medicare Annual Wellness Visit:  Diet: She does eat meat. She consumes some fruits and veggies. She does eat some fried foods. She drinks mostly water, some soda. Physical activity: Sedentary Depression/mood screen: Negative Hearing: Intact to whispered voice Visual acuity: Grossly normal, performs annual eye exam  ADLs: Capable Fall risk: None Home safety: Good Cognitive evaluation: Intact to orientation, naming, recall and repetition EOL planning: No adv directives, full code/ I agree  Preventative Medicine: Flu and prevnar today. She will get pneumovax in 1 year. She declines tetanus and shingrix. She declines pap smear, mammogram, bone density. She declines colonoscopy but is agreeable to Cologuard-ordered. Encouraged her to consume a balanced diet and exercise regimen. Advised her to see an eye doctor and dentist annually. Will check CBC, CMET, Lipid   Right Carotid Bruit:  Carotid ultrasound ordered Encouraged smoking cessation  Next appointment: RTC in 1 year, sooner if needed   Webb Silversmith, NP

## 2018-08-25 NOTE — Patient Instructions (Signed)
Health Maintenance for Postmenopausal Women Menopause is a normal process in which your reproductive ability comes to an end. This process happens gradually over a span of months to years, usually between the ages of 22 and 9. Menopause is complete when you have missed 12 consecutive menstrual periods. It is important to talk with your health care provider about some of the most common conditions that affect postmenopausal women, such as heart disease, cancer, and bone loss (osteoporosis). Adopting a healthy lifestyle and getting preventive care can help to promote your health and wellness. Those actions can also lower your chances of developing some of these common conditions. What should I know about menopause? During menopause, you may experience a number of symptoms, such as:  Moderate-to-severe hot flashes.  Night sweats.  Decrease in sex drive.  Mood swings.  Headaches.  Tiredness.  Irritability.  Memory problems.  Insomnia.  Choosing to treat or not to treat menopausal changes is an individual decision that you make with your health care provider. What should I know about hormone replacement therapy and supplements? Hormone therapy products are effective for treating symptoms that are associated with menopause, such as hot flashes and night sweats. Hormone replacement carries certain risks, especially as you become older. If you are thinking about using estrogen or estrogen with progestin treatments, discuss the benefits and risks with your health care provider. What should I know about heart disease and stroke? Heart disease, heart attack, and stroke become more likely as you age. This may be due, in part, to the hormonal changes that your body experiences during menopause. These can affect how your body processes dietary fats, triglycerides, and cholesterol. Heart attack and stroke are both medical emergencies. There are many things that you can do to help prevent heart disease  and stroke:  Have your blood pressure checked at least every 1-2 years. High blood pressure causes heart disease and increases the risk of stroke.  If you are 53-22 years old, ask your health care provider if you should take aspirin to prevent a heart attack or a stroke.  Do not use any tobacco products, including cigarettes, chewing tobacco, or electronic cigarettes. If you need help quitting, ask your health care provider.  It is important to eat a healthy diet and maintain a healthy weight. ? Be sure to include plenty of vegetables, fruits, low-fat dairy products, and lean protein. ? Avoid eating foods that are high in solid fats, added sugars, or salt (sodium).  Get regular exercise. This is one of the most important things that you can do for your health. ? Try to exercise for at least 150 minutes each week. The type of exercise that you do should increase your heart rate and make you sweat. This is known as moderate-intensity exercise. ? Try to do strengthening exercises at least twice each week. Do these in addition to the moderate-intensity exercise.  Know your numbers.Ask your health care provider to check your cholesterol and your blood glucose. Continue to have your blood tested as directed by your health care provider.  What should I know about cancer screening? There are several types of cancer. Take the following steps to reduce your risk and to catch any cancer development as early as possible. Breast Cancer  Practice breast self-awareness. ? This means understanding how your breasts normally appear and feel. ? It also means doing regular breast self-exams. Let your health care provider know about any changes, no matter how small.  If you are 40  or older, have a clinician do a breast exam (clinical breast exam or CBE) every year. Depending on your age, family history, and medical history, it may be recommended that you also have a yearly breast X-ray (mammogram).  If you  have a family history of breast cancer, talk with your health care provider about genetic screening.  If you are at high risk for breast cancer, talk with your health care provider about having an MRI and a mammogram every year.  Breast cancer (BRCA) gene test is recommended for women who have family members with BRCA-related cancers. Results of the assessment will determine the need for genetic counseling and BRCA1 and for BRCA2 testing. BRCA-related cancers include these types: ? Breast. This occurs in males or females. ? Ovarian. ? Tubal. This may also be called fallopian tube cancer. ? Cancer of the abdominal or pelvic lining (peritoneal cancer). ? Prostate. ? Pancreatic.  Cervical, Uterine, and Ovarian Cancer Your health care provider may recommend that you be screened regularly for cancer of the pelvic organs. These include your ovaries, uterus, and vagina. This screening involves a pelvic exam, which includes checking for microscopic changes to the surface of your cervix (Pap test).  For women ages 21-65, health care providers may recommend a pelvic exam and a Pap test every three years. For women ages 79-65, they may recommend the Pap test and pelvic exam, combined with testing for human papilloma virus (HPV), every five years. Some types of HPV increase your risk of cervical cancer. Testing for HPV may also be done on women of any age who have unclear Pap test results.  Other health care providers may not recommend any screening for nonpregnant women who are considered low risk for pelvic cancer and have no symptoms. Ask your health care provider if a screening pelvic exam is right for you.  If you have had past treatment for cervical cancer or a condition that could lead to cancer, you need Pap tests and screening for cancer for at least 20 years after your treatment. If Pap tests have been discontinued for you, your risk factors (such as having a new sexual partner) need to be  reassessed to determine if you should start having screenings again. Some women have medical problems that increase the chance of getting cervical cancer. In these cases, your health care provider may recommend that you have screening and Pap tests more often.  If you have a family history of uterine cancer or ovarian cancer, talk with your health care provider about genetic screening.  If you have vaginal bleeding after reaching menopause, tell your health care provider.  There are currently no reliable tests available to screen for ovarian cancer.  Lung Cancer Lung cancer screening is recommended for adults 69-62 years old who are at high risk for lung cancer because of a history of smoking. A yearly low-dose CT scan of the lungs is recommended if you:  Currently smoke.  Have a history of at least 30 pack-years of smoking and you currently smoke or have quit within the past 15 years. A pack-year is smoking an average of one pack of cigarettes per day for one year.  Yearly screening should:  Continue until it has been 15 years since you quit.  Stop if you develop a health problem that would prevent you from having lung cancer treatment.  Colorectal Cancer  This type of cancer can be detected and can often be prevented.  Routine colorectal cancer screening usually begins at  age 42 and continues through age 45.  If you have risk factors for colon cancer, your health care provider may recommend that you be screened at an earlier age.  If you have a family history of colorectal cancer, talk with your health care provider about genetic screening.  Your health care provider may also recommend using home test kits to check for hidden blood in your stool.  A small camera at the end of a tube can be used to examine your colon directly (sigmoidoscopy or colonoscopy). This is done to check for the earliest forms of colorectal cancer.  Direct examination of the colon should be repeated every  5-10 years until age 71. However, if early forms of precancerous polyps or small growths are found or if you have a family history or genetic risk for colorectal cancer, you may need to be screened more often.  Skin Cancer  Check your skin from head to toe regularly.  Monitor any moles. Be sure to tell your health care provider: ? About any new moles or changes in moles, especially if there is a change in a mole's shape or color. ? If you have a mole that is larger than the size of a pencil eraser.  If any of your family members has a history of skin cancer, especially at a young age, talk with your health care provider about genetic screening.  Always use sunscreen. Apply sunscreen liberally and repeatedly throughout the day.  Whenever you are outside, protect yourself by wearing long sleeves, pants, a wide-brimmed hat, and sunglasses.  What should I know about osteoporosis? Osteoporosis is a condition in which bone destruction happens more quickly than new bone creation. After menopause, you may be at an increased risk for osteoporosis. To help prevent osteoporosis or the bone fractures that can happen because of osteoporosis, the following is recommended:  If you are 46-71 years old, get at least 1,000 mg of calcium and at least 600 mg of vitamin D per day.  If you are older than age 55 but younger than age 65, get at least 1,200 mg of calcium and at least 600 mg of vitamin D per day.  If you are older than age 54, get at least 1,200 mg of calcium and at least 800 mg of vitamin D per day.  Smoking and excessive alcohol intake increase the risk of osteoporosis. Eat foods that are rich in calcium and vitamin D, and do weight-bearing exercises several times each week as directed by your health care provider. What should I know about how menopause affects my mental health? Depression may occur at any age, but it is more common as you become older. Common symptoms of depression  include:  Low or sad mood.  Changes in sleep patterns.  Changes in appetite or eating patterns.  Feeling an overall lack of motivation or enjoyment of activities that you previously enjoyed.  Frequent crying spells.  Talk with your health care provider if you think that you are experiencing depression. What should I know about immunizations? It is important that you get and maintain your immunizations. These include:  Tetanus, diphtheria, and pertussis (Tdap) booster vaccine.  Influenza every year before the flu season begins.  Pneumonia vaccine.  Shingles vaccine.  Your health care provider may also recommend other immunizations. This information is not intended to replace advice given to you by your health care provider. Make sure you discuss any questions you have with your health care provider. Document Released: 12/06/2005  Document Revised: 05/03/2016 Document Reviewed: 07/18/2015 Elsevier Interactive Patient Education  2018 Elsevier Inc.  

## 2018-08-25 NOTE — Assessment & Plan Note (Signed)
TSH today 

## 2018-08-25 NOTE — Assessment & Plan Note (Signed)
She continues to smoke, declines quiiting at this time She will continue to follow with Dr. Alva Garnet

## 2018-08-26 LAB — LIPID PANEL
CHOL/HDL RATIO: 4.9 ratio — AB (ref 0.0–4.4)
Cholesterol, Total: 240 mg/dL — ABNORMAL HIGH (ref 100–199)
HDL: 49 mg/dL (ref >39)
LDL Calculated: 159 mg/dL — ABNORMAL HIGH (ref 0–99)
Triglycerides: 159 mg/dL — ABNORMAL HIGH (ref 0–149)
VLDL Cholesterol Cal: 32 mg/dL (ref 5–40)

## 2018-08-26 LAB — COMPREHENSIVE METABOLIC PANEL
A/G RATIO: 1.6 (ref 1.2–2.2)
ALBUMIN: 4.4 g/dL (ref 3.6–4.8)
ALT: 16 IU/L (ref 0–32)
AST: 17 IU/L (ref 0–40)
Alkaline Phosphatase: 83 IU/L (ref 39–117)
BILIRUBIN TOTAL: 0.2 mg/dL (ref 0.0–1.2)
BUN / CREAT RATIO: 18 (ref 12–28)
BUN: 15 mg/dL (ref 8–27)
CHLORIDE: 104 mmol/L (ref 96–106)
CO2: 23 mmol/L (ref 20–29)
Calcium: 9.6 mg/dL (ref 8.7–10.3)
Creatinine, Ser: 0.82 mg/dL (ref 0.57–1.00)
GFR calc non Af Amer: 74 mL/min/{1.73_m2} (ref >59)
GFR, EST AFRICAN AMERICAN: 86 mL/min/{1.73_m2} (ref >59)
GLUCOSE: 90 mg/dL (ref 65–99)
Globulin, Total: 2.8 g/dL (ref 1.5–4.5)
POTASSIUM: 4.1 mmol/L (ref 3.5–5.2)
SODIUM: 145 mmol/L — AB (ref 134–144)
Total Protein: 7.2 g/dL (ref 6.0–8.5)

## 2018-08-26 LAB — CBC
HEMOGLOBIN: 16.2 g/dL — AB (ref 11.1–15.9)
Hematocrit: 48.8 % — ABNORMAL HIGH (ref 34.0–46.6)
MCH: 31.2 pg (ref 26.6–33.0)
MCHC: 33.2 g/dL (ref 31.5–35.7)
MCV: 94 fL (ref 79–97)
Platelets: 234 10*3/uL (ref 150–450)
RBC: 5.2 x10E6/uL (ref 3.77–5.28)
RDW: 12.7 % (ref 12.3–15.4)
WBC: 8.8 10*3/uL (ref 3.4–10.8)

## 2018-08-26 LAB — TSH: TSH: 0.538 u[IU]/mL (ref 0.450–4.500)

## 2018-08-26 NOTE — Addendum Note (Signed)
Addended by: Lurlean Nanny on: 08/26/2018 01:22 PM   Modules accepted: Orders

## 2018-08-31 ENCOUNTER — Telehealth: Payer: Self-pay

## 2018-08-31 NOTE — Telephone Encounter (Signed)
Called pt no answer and vm

## 2018-09-02 ENCOUNTER — Telehealth: Payer: Self-pay

## 2018-09-02 NOTE — Telephone Encounter (Signed)
Cologuard form has been faxed w/ demographics and insurance card attached 

## 2018-09-04 NOTE — Telephone Encounter (Signed)
Left message on voicemail.

## 2018-09-09 ENCOUNTER — Ambulatory Visit (INDEPENDENT_AMBULATORY_CARE_PROVIDER_SITE_OTHER): Payer: Medicare Other

## 2018-09-09 DIAGNOSIS — R0989 Other specified symptoms and signs involving the circulatory and respiratory systems: Secondary | ICD-10-CM | POA: Diagnosis not present

## 2018-09-10 ENCOUNTER — Other Ambulatory Visit: Payer: Self-pay | Admitting: Internal Medicine

## 2018-09-10 ENCOUNTER — Telehealth: Payer: Self-pay

## 2018-09-10 DIAGNOSIS — F17200 Nicotine dependence, unspecified, uncomplicated: Secondary | ICD-10-CM

## 2018-09-10 DIAGNOSIS — E782 Mixed hyperlipidemia: Secondary | ICD-10-CM

## 2018-09-10 DIAGNOSIS — I6521 Occlusion and stenosis of right carotid artery: Secondary | ICD-10-CM

## 2018-09-10 NOTE — Progress Notes (Signed)
amb ref °

## 2018-09-10 NOTE — Telephone Encounter (Signed)
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 I need to have her seen by vascular ASAP. Really think she needs to stop smoking, star the cholesterol lowering medication (see result note) and start baby ASA 81 mg daily to prevent stroke.

## 2018-09-10 NOTE — Telephone Encounter (Signed)
I have called mobile and home number with no answer, Left message on voicemail

## 2018-09-11 MED ORDER — SIMVASTATIN 10 MG PO TABS: 10.0000 mg | ORAL_TABLET | Freq: Every day | ORAL | 2 refills | Status: DC

## 2018-09-11 NOTE — Addendum Note (Signed)
Addended by: Lurlean Nanny on: 09/11/2018 10:46 AM   Modules accepted: Orders

## 2018-09-11 NOTE — Telephone Encounter (Signed)
Pt returned Melanie's call for results. Pt is at work so the best number to reach her is [phone], it is her direct line.

## 2018-10-05 ENCOUNTER — Telehealth: Payer: Self-pay | Admitting: Vascular Surgery

## 2018-10-05 ENCOUNTER — Telehealth: Payer: Self-pay | Admitting: *Deleted

## 2018-10-05 ENCOUNTER — Other Ambulatory Visit: Payer: Self-pay | Admitting: *Deleted

## 2018-10-05 ENCOUNTER — Ambulatory Visit (INDEPENDENT_AMBULATORY_CARE_PROVIDER_SITE_OTHER): Payer: Medicare Other | Admitting: Vascular Surgery

## 2018-10-05 ENCOUNTER — Encounter: Payer: Self-pay | Admitting: Vascular Surgery

## 2018-10-05 VITALS — BP 152/119 | HR 113 | Temp 97.3°F | Resp 20 | Ht 65.0 in | Wt 127.2 lb

## 2018-10-05 DIAGNOSIS — I70213 Atherosclerosis of native arteries of extremities with intermittent claudication, bilateral legs: Secondary | ICD-10-CM

## 2018-10-05 DIAGNOSIS — I6521 Occlusion and stenosis of right carotid artery: Secondary | ICD-10-CM

## 2018-10-05 NOTE — Progress Notes (Signed)
[facility] [place]  Patient name: Pamela Haley MRN: [id number] DOB: 04/17/1951 Sex: female  REASON FOR CONSULT: Evaluation carotid duplex showing right carotid occlusion  Seen today in our [place] office  HPI: Pamela Haley is a 66 y.o. female, who is today for discussion of recent carotid duplex showing right carotid occlusion.  She is right-handed.  She denies any prior history of stroke, amaurosis fugax, a aphasia or TIA.  She was found to have a carotid bruit and underwent duplex for further evaluation.  This revealed occluded right internal carotid artery and no significant stenosis in the left carotid.  She does not have any history of cardiac disease.  On questioning her walking ability she does have classic bilateral lower extremity claudication symptoms which is throughout her entire legs from her thighs into her calves.  No lower extremity arterial tissue loss.  This is been present for years but has become progressively more difficult for her to do her job with a great deal of walking  Past Medical History:  Diagnosis Date  . Abnormal EKG    HX OF PREVIOUS EKGS SHOWING POSSIBLE MI--BUT PT HAS NEVER HAD ANY HEART PROBLEMS--HAD BACK SURGERY AUG 2012 AT [facility] - NO HEART PROBLEMS  . Chronic cough    PT IS A SMOKER  . DDD (degenerative disc disease), lumbar    PAIN IN LOWER BACK AND DOWN RT HIP AND LEG--NUMBNESS, THROBBING, BURNING  . Hyperthyroidism 02/20/2015    Family History  Problem Relation Age of Onset  . Heart disease Mother   . Heart disease Father   . Stroke Maternal Grandmother   . Diabetes Maternal Grandmother     SOCIAL HISTORY: Social History   Socioeconomic History  . Marital status: Single    Spouse name: Not on file  . Number of children: Not on file  . Years of education: Not on file  . Highest education level: Not on file  Occupational History  . Not on file  Social Needs  . Financial  resource strain: Not on file  . Food insecurity:    Worry: Not on file    Inability: Not on file  . Transportation needs:    Medical: Not on file    Non-medical: Not on file  Tobacco Use  . Smoking status: Current Every Day Smoker    Packs/day: 0.50    Years: 30.00    Pack years: 15.00    Types: Cigarettes  . Smokeless tobacco: Never Used  Substance and Sexual Activity  . Alcohol use: Yes    Alcohol/week: 0.0 standard drinks    Comment: rare  . Drug use: No  . Sexual activity: Not Currently  Lifestyle  . Physical activity:    Days per week: Not on file    Minutes per session: Not on file  . Stress: Not on file  Relationships  . Social connections:    Talks on phone: Not on file    Gets together: Not on file    Attends religious service: Not on file    Active member of club or organization: Not on file    Attends meetings of clubs or organizations: Not on file    Relationship status: Not on file  . Intimate partner violence:    Fear of current or ex partner: Not on file    Emotionally abused: Not on file    Physically abused: Not on file    Forced sexual activity: Not on file  Other Topics Concern  . Not on file  Social History Narrative  . Not on file    No Known Allergies  Current Outpatient Medications  Medication Sig Dispense Refill  . cyclobenzaprine (FLEXERIL) 10 MG tablet Take 1 tablet (10 mg total) by mouth 3 (three) times daily as needed for muscle spasms. 30 tablet 0  . Glycopyrrolate-Formoterol (BEVESPI AEROSPHERE) 9-4.8 MCG/ACT AERO Inhale 2 puffs into the lungs 2 (two) times daily. 10.7 g 5  . simvastatin (ZOCOR) 10 MG tablet Take 1 tablet (10 mg total) by mouth at bedtime. SCHEDULE LAB VISIT Feb 2020 30 tablet 2  . albuterol (PROVENTIL HFA;VENTOLIN HFA) 108 (90 Base) MCG/ACT inhaler Inhale 1-2 puffs into the lungs every 6 (six) hours as needed for wheezing or shortness of breath. 18 g 2   No current facility-administered medications for this visit.       REVIEW OF SYSTEMS:  [X]  denotes positive finding, [ ]  denotes negative finding Cardiac  Comments:  Chest pain or chest pressure:    Shortness of breath upon exertion: x   Short of breath when lying flat:    Irregular heart rhythm:        Vascular    Pain in calf, thigh, or hip brought on by ambulation:    Pain in feet at night that wakes you up from your sleep:     Blood clot in your veins:    Leg swelling:         Pulmonary    Oxygen at home:    Productive cough:     Wheezing:  x       Neurologic    Sudden weakness in arms or legs:     Sudden numbness in arms or legs:     Sudden onset of difficulty speaking or slurred speech:    Temporary loss of vision in one eye:     Problems with dizziness:         Gastrointestinal    Blood in stool:     Vomited blood:         Genitourinary    Burning when urinating:     Blood in urine:        Psychiatric    Major depression:         Hematologic    Bleeding problems:    Problems with blood clotting too easily:        Skin    Rashes or ulcers:        Constitutional    Fever or chills:      PHYSICAL EXAM: Vitals:   10/05/18 1116  BP: (!) 152/119  Pulse: (!) 113  Resp: 20  Temp: (!) 97.3 F (36.3 C)  TempSrc: Temporal  Weight: 127 lb 3.2 oz (57.7 kg)  Height: 5\' 5"  (1.651 m)    GENERAL: The patient is a well-nourished female, in no acute distress. The vital signs are documented above. CARDIOVASCULAR: I do not appreciate carotid bruits bilaterally.  She does have 2+ radial pulses bilaterally.  She has palpable but diminished femoral pulses bilaterally and I do not palpate popliteal or distal pulses bilaterally PULMONARY: There is good air exchange  ABDOMEN: Soft and non-tender  MUSCULOSKELETAL: There are no major deformities or cyanosis. NEUROLOGIC: No focal weakness or paresthesias are detected. SKIN: There are no ulcers or rashes noted. PSYCHIATRIC: The patient has a normal affect.  DATA:  [place] did this shows complete occlusion of her right internal carotid artery and  no significant narrowing in her left internal carotid artery  MEDICAL ISSUES: Long discussion with the patient regarding her traumatic carotid occlusion.  Explained that fortunately she has had adequate collateralization to not have any neurologic deficits when she occluded her carotid artery.  This is a stable situation with no options for surgical treatment.  I did recommend a repeat duplex in 1 year to rule out any changes in her left carotid.  If this continues to show no significant disease, would not follow her carotid actively.  Regarding her lower extremity symptoms, she does appear to have symptoms related to arterial insufficiency from a standpoint of her history and physical exam.  Have recommended noninvasive studies with ankle arm index bilaterally and duplex bilaterally to determine her level of disease.  If this confirms a probable iliac disease, would recommend arteriography for further evaluation and treatment.  We will schedule this at her earliest convenience and make recommendations following the study   Rosetta Posner, MD [facility] Tel [phone] Pager [phone]

## 2018-10-05 NOTE — Telephone Encounter (Signed)
sch appt spk to pt 10/14/18 3pm LE Art 4pm ABI

## 2018-10-05 NOTE — Telephone Encounter (Signed)
-----   Message from Luanne Bras, [place] sent at 10/05/2018 11:38 AM EST ----- Regarding: ABI  Lower ext Doppler and ABI in gso now (1-2) weeks and show doctor Early the results

## 2018-10-05 NOTE — Telephone Encounter (Signed)
-----   Message from Willy Eddy, RN sent at 10/05/2018 11:50 AM EST ----- Regarding: FW: ABI    ----- Message ----- From: Luanne Bras, CMA Sent: 10/05/2018  11:38 AM EST To: Mena Goes, RN, Willy Eddy, RN Subject: ABI                                            Lower ext Doppler and ABI in gso now (1-2) weeks and show doctor Early the results

## 2018-10-06 ENCOUNTER — Other Ambulatory Visit: Payer: Self-pay

## 2018-10-14 ENCOUNTER — Ambulatory Visit (INDEPENDENT_AMBULATORY_CARE_PROVIDER_SITE_OTHER): Payer: Medicare Other | Admitting: Pulmonary Disease

## 2018-10-14 ENCOUNTER — Encounter: Payer: Self-pay | Admitting: Pulmonary Disease

## 2018-10-14 ENCOUNTER — Encounter (HOSPITAL_COMMUNITY): Payer: Medicare Other

## 2018-10-14 DIAGNOSIS — J441 Chronic obstructive pulmonary disease with (acute) exacerbation: Secondary | ICD-10-CM

## 2018-10-14 DIAGNOSIS — I6521 Occlusion and stenosis of right carotid artery: Secondary | ICD-10-CM

## 2018-10-14 DIAGNOSIS — J449 Chronic obstructive pulmonary disease, unspecified: Secondary | ICD-10-CM

## 2018-10-14 DIAGNOSIS — F17200 Nicotine dependence, unspecified, uncomplicated: Secondary | ICD-10-CM

## 2018-10-14 MED ORDER — DOXYCYCLINE HYCLATE 100 MG PO TABS: 100.0000 mg | ORAL_TABLET | Freq: Two times a day (BID) | ORAL | 0 refills | Status: DC

## 2018-10-14 MED ORDER — HYDROCOD POLST-CPM POLST ER 10-8 MG/5ML PO SUER: 5.0000 mL | Freq: Two times a day (BID) | ORAL | 0 refills | Status: DC | PRN

## 2018-10-14 MED ORDER — PREDNISONE 20 MG PO TABS: 40.0000 mg | ORAL_TABLET | Freq: Every day | ORAL | 0 refills | Status: AC

## 2018-10-14 NOTE — Patient Instructions (Signed)
Prednisone 40 mg (2 x 20 mg) daily for 5 days.  Take your first dose today Doxycycline 100 mg twice a day for 5 days.  Take your first dose this evening Tussionex 5 cc up to every 12 hours as needed for cough suppression I strongly recommend that you remain out of work for the next week (through 10/21/2018) Continue Bevespi inhaler twice a day Continue albuterol inhaler as needed for increased shortness of breath, cough, chest tightness, wheezing Follow-up in 4 to 6 weeks with chest x-ray prior to that visit

## 2018-10-15 NOTE — Progress Notes (Signed)
PULMONARY OFFICE FOLLOW-UP NOTE  Requesting MD/Service: Self referred Date of initial consultation: 06/12/17 Reason for consultation: COPD  PT PROFILE: 67 y.o. female smoker with recent diagnosis of COPD after a flulike illness  DATA: 07/29/17 PFTs: Mod-severe obstruction (FEV1 1.19 L, 49%), hyperinflation and gas trapping  - TLC 6.80 L (135% pred), RV 3.78 L (192%), DLCO 50% predicted  INTERVAL: Last seen 03/17/18. Received Z-Pak prescribed by Dr. Mortimer Fries in June.  Failed to follow-up for planned 70-month visit  SUBJ:  This is an acute visit.  She reports feeling miserable for approximately 3 weeks.  Onset of this illness included subjective fevers and chills.  Now she has persistent cough and rib pain.  She has been treated by her primary care physician with a Z-Pak and a prednisone taper.  She tried to go back to work this Monday (the 16th) but was so profoundly fatigued had difficulty completing the day.  She states that her fever "broke" yesterday afternoon.  However, she continues to have severe malaise.  She does not describe a lot of dyspnea.  Her cough is mostly nonproductive.  She has had no hemoptysis.  She denies lower extremity edema calf tenderness.  She has not smoked for 3 weeks corresponding with the duration of this illness.  Vitals:   10/14/18 1336 10/14/18 1341  BP:  130/88  Pulse:  91  Resp: 16   SpO2:  90%  Weight: 126 lb (57.2 kg)   Height: 5\' 5"  (1.651 m)   RA  EXAM:  Gen: Hacking cough during this encounter, appears profoundly fatigued, mildly disheveled (which is not her norm) HEENT: NCAT, sclera white, no findings in oropharynx Neck: No JVD, no lymphadenopathy Lungs: breath sounds mildly diminished without wheezes or other adventitious sounds Cardiovascular: RRR, no murmurs Abdomen: Soft, nontender, normal BS Ext: without clubbing, cyanosis, edema Neuro: grossly intact Skin: Limited exam, no lesions noted    DATA:   BMP Latest Ref Rng & Units  08/25/2018 05/17/2017 10/01/2016  Glucose 65 - 99 mg/dL 90 155(H) 89  BUN 8 - 27 mg/dL 15 11 12   Creatinine 0.57 - 1.00 mg/dL 0.82 0.70 0.75  BUN/Creat Ratio 12 - 28 18 - 16  Sodium 134 - 144 mmol/L 145(H) 138 142  Potassium 3.5 - 5.2 mmol/L 4.1 3.7 4.6  Chloride 96 - 106 mmol/L 104 99(L) 98  CO2 20 - 29 mmol/L 23 28 27   Calcium 8.7 - 10.3 mg/dL 9.6 9.9 9.8    CBC Latest Ref Rng & Units 08/25/2018 05/17/2017 10/01/2016  WBC 3.4 - 10.8 x10E3/uL 8.8 11.8(H) 11.3(H)  Hemoglobin 11.1 - 15.9 g/dL 16.2(H) 17.2(H) 17.7(H)  Hematocrit 34.0 - 46.6 % 48.8(H) 50.7(H) 50.8(H)  Platelets 150 - 450 x10E3/uL 234 251 275    CXR: No new film  IMPRESSION:     ICD-10-CM   1. COPD with acute exacerbation ([place]) J44.1 DG Chest 2 View  2. COPD, moderate ([place]) J44.9 DG Chest 2 View  3. Smoker, abstinent for past 3 weeks F17.200 DG Chest 2 View    PLAN:  Prednisone 40 mg daily for 5 days Doxycycline 100 mg twice daily for 5 days Tussionex 5 cc every 12 hours as needed for cough suppression  I emphasized that she not mix with alcohol and not drive after using this medication I recommended that she remain out of work through 12/25 Continue Bevespi inhaler twice daily Continue albuterol inhaler as needed Follow-up in 4 to 6 weeks with chest x-ray prior to that visit  Merton Border, MD PCCM service Mobile [phone] Pager [phone] 10/15/2018 10:45 AM

## 2018-10-29 ENCOUNTER — Other Ambulatory Visit: Payer: Self-pay | Admitting: Pulmonary Disease

## 2018-10-29 ENCOUNTER — Telehealth: Payer: Self-pay

## 2018-10-29 NOTE — Telephone Encounter (Signed)
Patient came to our lobby to drop off FMLA paperwork. I have faxed it to [employer] at [employer] to begin processing. Sending interoffice mail today along with $25 check. Jennie aware.

## 2018-11-04 ENCOUNTER — Ambulatory Visit (HOSPITAL_COMMUNITY): Payer: Medicare Other

## 2018-11-06 NOTE — Progress Notes (Signed)
FMLA paperwork completed and sent back to [place] mail today.

## 2018-11-19 ENCOUNTER — Telehealth: Payer: Self-pay | Admitting: Pulmonary Disease

## 2018-11-19 NOTE — Telephone Encounter (Signed)
Left detailed message patient needs appointment. Per ov in Dec 2019 she needs 4-6 week f/u and chest xray prior.

## 2018-11-20 NOTE — Telephone Encounter (Signed)
2nd message left for patient to call back.

## 2018-11-23 ENCOUNTER — Ambulatory Visit: Payer: Medicare Other | Admitting: Pulmonary Disease

## 2018-11-23 NOTE — Telephone Encounter (Signed)
Patient had appt today in which she cancelled. Left message before letter can be written she will need appt. Pt is to reschedule at her convenience.

## 2018-12-07 ENCOUNTER — Telehealth: Payer: Self-pay

## 2018-12-07 NOTE — Telephone Encounter (Signed)
[facility], [facility] needed for Bevespi. After completion of PA, it was denied. Patient must try and fail Anoro (formulary med) before proceeding with Bevespi. Will send to Dr. Alva Garnet to okay switch.

## 2018-12-07 NOTE — Telephone Encounter (Signed)
Patient called about this and would like a call back once Anoro has been okayed.  She would like this sent to [place] at [place].  CB is [phone] (home today) or [phone] (work).

## 2018-12-07 NOTE — Telephone Encounter (Signed)
Spoke to patient, she is aware we will call her once I hear back from Dr. Alva Garnet, it is okay to switch.

## 2018-12-08 MED ORDER — UMECLIDINIUM-VILANTEROL 62.5-25 MCG/INH IN AEPB: 1.0000 | INHALATION_SPRAY | Freq: Every day | RESPIRATORY_TRACT | 5 refills | Status: AC

## 2018-12-08 NOTE — Telephone Encounter (Signed)
lmtcb x1 for pt. Rx for anoro has been sent to [facility].

## 2018-12-08 NOTE — Addendum Note (Signed)
Addended by: Maryanna Shape A on: 12/08/2018 11:53 AM   Modules accepted: Orders

## 2018-12-08 NOTE — Telephone Encounter (Signed)
ok 

## 2018-12-09 DIAGNOSIS — S52125A Nondisplaced fracture of head of left radius, initial encounter for closed fracture: Secondary | ICD-10-CM | POA: Diagnosis not present

## 2018-12-09 DIAGNOSIS — M25422 Effusion, left elbow: Secondary | ICD-10-CM | POA: Diagnosis not present

## 2018-12-09 DIAGNOSIS — M25522 Pain in left elbow: Secondary | ICD-10-CM | POA: Diagnosis not present

## 2018-12-10 NOTE — Telephone Encounter (Signed)
Patient aware, she picked the Anoro up from the pharmacy yesterday.

## 2018-12-10 NOTE — Telephone Encounter (Signed)
Lmtcb x2 for pt 

## 2018-12-24 ENCOUNTER — Ambulatory Visit: Payer: Medicare Other | Admitting: Pulmonary Disease

## 2019-01-07 DIAGNOSIS — S52125D Nondisplaced fracture of head of left radius, subsequent encounter for closed fracture with routine healing: Secondary | ICD-10-CM | POA: Diagnosis not present

## 2019-01-13 ENCOUNTER — Ambulatory Visit: Payer: Medicare Other | Admitting: Pulmonary Disease

## 2019-02-01 ENCOUNTER — Ambulatory Visit: Payer: Medicare Other | Admitting: Pulmonary Disease

## 2019-05-12 ENCOUNTER — Ambulatory Visit: Payer: Medicare Other | Admitting: Pulmonary Disease

## 2019-06-09 ENCOUNTER — Ambulatory Visit: Payer: Medicare Other | Admitting: Pulmonary Disease

## 2019-06-16 DIAGNOSIS — M545 Low back pain: Secondary | ICD-10-CM | POA: Diagnosis not present

## 2019-06-18 ENCOUNTER — Telehealth: Payer: Self-pay | Admitting: Pulmonary Disease

## 2019-06-18 NOTE — Telephone Encounter (Signed)
Called patient for COVID-19 pre-screening for in office visit. ° °Have you recently traveled any where out of the local area in the last 2 weeks? No ° °Have you been in close contact with a person diagnosed with COVID-19 or someone awaiting results within the last 2 weeks? No ° °Do you currently have any of the following symptoms? If so, when did they start? °Cough     Diarrhea   Joint Pain °Fever      Muscle Pain   Red eyes °Shortness of breath   Abdominal pain  Vomiting °Loss of smell    Rash    Sore Throat °Headache    Weakness   Bruising or bleeding ° ° °Okay to proceed with visit 06/21/2019   ° ° °

## 2019-06-21 ENCOUNTER — Encounter: Payer: Self-pay | Admitting: Pulmonary Disease

## 2019-06-21 ENCOUNTER — Other Ambulatory Visit: Payer: Self-pay

## 2019-06-21 ENCOUNTER — Ambulatory Visit (INDEPENDENT_AMBULATORY_CARE_PROVIDER_SITE_OTHER): Payer: Medicare Other | Admitting: Pulmonary Disease

## 2019-06-21 VITALS — BP 124/70 | HR 110 | Temp 97.1°F | Ht 65.0 in | Wt 123.4 lb

## 2019-06-21 DIAGNOSIS — F17200 Nicotine dependence, unspecified, uncomplicated: Secondary | ICD-10-CM

## 2019-06-21 DIAGNOSIS — J449 Chronic obstructive pulmonary disease, unspecified: Secondary | ICD-10-CM

## 2019-06-21 MED ORDER — STIOLTO RESPIMAT 2.5-2.5 MCG/ACT IN AERS: 2.0000 | INHALATION_SPRAY | Freq: Every day | RESPIRATORY_TRACT | 2 refills | Status: DC

## 2019-06-21 NOTE — Patient Instructions (Signed)
I encourage complete abstinence from cigarettes  Resume Stiolto inhaler, 2 actuations daily.  We will complete the prior authorization  Continue albuterol inhaler as needed  Follow-up with Dr. Mortimer Fries in 6 months.  Call sooner if needed

## 2019-06-21 NOTE — Progress Notes (Signed)
PULMONARY OFFICE FOLLOW-UP NOTE  Requesting MD/Service: Self referred Date of initial consultation: 06/12/17 Reason for consultation: COPD  PT PROFILE: 68 y.o. female smoker with recent diagnosis of COPD after a flulike illness  DATA: 07/29/17 PFTs: Mod-severe obstruction (FEV1 1.19 L, 49%), hyperinflation and gas trapping  - TLC 6.80 L (135% pred), RV 3.78 L (192%), DLCO 50% predicted  INTERVAL: Last seen 12/18//19.  No major pulmonary events in the interim  SUBJ:  This is a scheduled follow-up.  She has no new complaints.  She was previously prescribed Stiolto but somehow Anoro was substituted.  She does not like the Anoro inhaler and therefore is not on any maintenance medications at this time.  She is not really using her albuterol rescue inhaler either.  She describes mild-moderate exertional dyspnea with little day-to-day or moment to moment variation.  She is somewhat vague about how much she is smoking and insists that she has not smoked in the past 6 weeks.  She denies CP, fever, purulent sputum, hemoptysis, LE edema and calf tenderness.  Vitals:   06/21/19 1419  BP: 124/70  Pulse: (!) 110  Temp: (!) 97.1 F (36.2 C)  TempSrc: Temporal  SpO2: 99%  Weight: 123 lb 6.4 oz (56 kg)  Height: 5\' 5"  (1.651 m)  RA  EXAM:  Gen: NAD HEENT: NCAT, sclerae white Neck: No JVD Lungs: breath sounds mildly diminished without wheezes or other adventitious sounds Cardiovascular: RRR, no murmurs Abdomen: Soft, nontender, normal BS Ext: without clubbing, cyanosis, edema Neuro: grossly intact Skin: Limited exam, no lesions noted    DATA:   BMP Latest Ref Rng & Units 08/25/2018 05/17/2017 10/01/2016  Glucose 65 - 99 mg/dL 90 155(H) 89  BUN 8 - 27 mg/dL 15 11 12   Creatinine 0.57 - 1.00 mg/dL 0.82 0.70 0.75  BUN/Creat Ratio 12 - 28 18 - 16  Sodium 134 - 144 mmol/L 145(H) 138 142  Potassium 3.5 - 5.2 mmol/L 4.1 3.7 4.6  Chloride 96 - 106 mmol/L 104 99(L) 98  CO2 20 - 29 mmol/L 23 28  27   Calcium 8.7 - 10.3 mg/dL 9.6 9.9 9.8    CBC Latest Ref Rng & Units 08/25/2018 05/17/2017 10/01/2016  WBC 3.4 - 10.8 x10E3/uL 8.8 11.8(H) 11.3(H)  Hemoglobin 11.1 - 15.9 g/dL 16.2(H) 17.2(H) 17.7(H)  Hematocrit 34.0 - 46.6 % 48.8(H) 50.7(H) 50.8(H)  Platelets 150 - 450 x10E3/uL 234 251 275    CXR: No new film  IMPRESSION:     ICD-10-CM   1. COPD, moderate ([place])  J44.9   2. Smoker  F17.200     PLAN:  I strongly encouraged complete abstinence from cigarettes  Resume Stiolto inhaler, 2 actuations daily.  We will complete the prior authorization  Continue albuterol inhaler as needed  Follow-up with Dr. Mortimer Fries in 6 months.  Call sooner if needed    Merton Border, MD PCCM service Mobile [phone] Pager [phone] 06/21/2019 3:02 PM

## 2019-06-22 ENCOUNTER — Telehealth: Payer: Self-pay | Admitting: Pulmonary Disease

## 2019-06-22 NOTE — Telephone Encounter (Signed)
[facility] and started PA for Stiolto 2.5. PA has been approved until 06/21/2020. Pt is aware of approval and voiced her understanding.

## 2019-06-24 ENCOUNTER — Telehealth: Payer: Self-pay | Admitting: Pulmonary Disease

## 2019-06-24 MED ORDER — STIOLTO RESPIMAT 2.5-2.5 MCG/ACT IN AERS: 2.0000 | INHALATION_SPRAY | Freq: Every day | RESPIRATORY_TRACT | 3 refills | Status: DC

## 2019-06-24 NOTE — Telephone Encounter (Signed)
Called and spoke to pt, who is requesting 3 month supply of stiolto [facility]  Rx has been sent to preferred pharmacy. Nothing further is needed.

## 2019-06-24 NOTE — Addendum Note (Signed)
Addended by: Maryanna Shape A on: 06/24/2019 02:42 PM   Modules accepted: Orders

## 2019-06-24 NOTE — Telephone Encounter (Signed)
pt called in stating that her new RX STIOLTO RESPIMAT is too expensive - [place] told her it would be cheaper if we did it through the mail and a 3 month supply. FAX number for [facility] [phone]- would need the original RX cancelled and a 3 month faxed to them. Please advise pt as well CB# [phone]

## 2019-07-26 DIAGNOSIS — Z23 Encounter for immunization: Secondary | ICD-10-CM | POA: Diagnosis not present

## 2019-08-02 ENCOUNTER — Telehealth: Payer: Self-pay | Admitting: Pulmonary Disease

## 2019-08-02 MED ORDER — STIOLTO RESPIMAT 2.5-2.5 MCG/ACT IN AERS: 2.0000 | INHALATION_SPRAY | Freq: Every day | RESPIRATORY_TRACT | 11 refills | Status: DC

## 2019-08-02 NOTE — Telephone Encounter (Signed)
Called and spoke to pt, who is requesting to pick up Rx for Stiolto. Rx has been printed and placed in DK's folder for signature.  Once signed, pt will be made aware.

## 2019-08-04 NOTE — Telephone Encounter (Signed)
Rx has been signed and placed up front for pickup. Pt is aware and voiced her understanding. Nothing further is needed.

## 2019-09-25 ENCOUNTER — Emergency Department: Payer: Medicare Other

## 2019-09-25 ENCOUNTER — Encounter: Payer: Self-pay | Admitting: Emergency Medicine

## 2019-09-25 ENCOUNTER — Other Ambulatory Visit: Payer: Self-pay

## 2019-09-25 ENCOUNTER — Emergency Department
Admission: EM | Admit: 2019-09-25 | Discharge: 2019-09-25 | Disposition: A | Discharge status: Home / Self Care | Payer: Medicare Other | Attending: Emergency Medicine | Admitting: Emergency Medicine

## 2019-09-25 DIAGNOSIS — F17210 Nicotine dependence, cigarettes, uncomplicated: Secondary | ICD-10-CM | POA: Insufficient documentation

## 2019-09-25 DIAGNOSIS — J449 Chronic obstructive pulmonary disease, unspecified: Secondary | ICD-10-CM | POA: Diagnosis not present

## 2019-09-25 DIAGNOSIS — R079 Chest pain, unspecified: Secondary | ICD-10-CM | POA: Diagnosis not present

## 2019-09-25 DIAGNOSIS — R000 Tachycardia, unspecified: Secondary | ICD-10-CM | POA: Diagnosis not present

## 2019-09-25 DIAGNOSIS — Z79899 Other long term (current) drug therapy: Secondary | ICD-10-CM | POA: Insufficient documentation

## 2019-09-25 LAB — BASIC METABOLIC PANEL
Anion gap: 11 (ref 5–15)
BUN: 10 mg/dL (ref 8–23)
CO2: 28 mmol/L (ref 22–32)
Calcium: 9.9 mg/dL (ref 8.9–10.3)
Chloride: 100 mmol/L (ref 98–111)
Creatinine, Ser: 0.66 mg/dL (ref 0.44–1.00)
GFR calc Af Amer: >60 mL/min (ref >60)
GFR calc non Af Amer: >60 mL/min (ref >60)
Glucose, Bld: 135 mg/dL — ABNORMAL HIGH (ref 70–99)
Potassium: 3.9 mmol/L (ref 3.5–5.1)
Sodium: 139 mmol/L (ref 135–145)

## 2019-09-25 LAB — CBC
HCT: 48.6 % — ABNORMAL HIGH (ref 36.0–46.0)
Hemoglobin: 16.9 g/dL — ABNORMAL HIGH (ref 12.0–15.0)
MCH: 31.2 pg (ref 26.0–34.0)
MCHC: 34.8 g/dL (ref 30.0–36.0)
MCV: 89.7 fL (ref 80.0–100.0)
Platelets: 212 10*3/uL (ref 150–400)
RBC: 5.42 MIL/uL — ABNORMAL HIGH (ref 3.87–5.11)
RDW: 12.9 % (ref 11.5–15.5)
WBC: 11.4 10*3/uL — ABNORMAL HIGH (ref 4.0–10.5)
nRBC: 0 % (ref 0.0–0.2)

## 2019-09-25 LAB — TROPONIN I (HIGH SENSITIVITY)
Troponin I (High Sensitivity): 4 ng/L (ref <18)
Troponin I (High Sensitivity): 5 ng/L (ref <18)

## 2019-09-25 MED ORDER — SODIUM CHLORIDE 0.9% FLUSH: 3.0000 mL | Freq: Once | INTRAVENOUS | Status: DC

## 2019-09-25 NOTE — ED Provider Notes (Signed)
[facility] Emergency Department Provider Note  Time seen: 7:53 PM  I have reviewed the triage vital signs and the nursing notes.   HISTORY  Chief Complaint Chest Pain   HPI [facility] is a 68 y.o. female with a past medical history of gastric reflux, COPD, presents to the emergency department for chest pain.  According to the patient around 1:30 PM today she developed chest pain.  States it felt like indigestion, did not have any medication for indigestion at home but states typically if she drinks water the indigestion will go away.  States it was not going away so she became worried and felt lightheaded at times.  Came to the emergency department for evaluation.  Upon arrival to the emergency department patient states the chest pain had resolved.  Denies any chest pain at this point.  Denies any recent fever cough or shortness of breath.  Denies any vomiting or diaphoresis.   Past Medical History:  Diagnosis Date  . Abnormal EKG    HX OF PREVIOUS EKGS SHOWING POSSIBLE MI--BUT PT HAS NEVER HAD ANY HEART PROBLEMS--HAD BACK SURGERY AUG 2012 AT [facility] - NO HEART PROBLEMS  . Chronic cough    PT IS A SMOKER  . DDD (degenerative disc disease), lumbar    PAIN IN LOWER BACK AND DOWN RT HIP AND LEG--NUMBNESS, THROBBING, BURNING  . Hyperthyroidism 02/20/2015    Patient Active Problem List   Diagnosis Date Noted  . COPD (chronic obstructive pulmonary disease) ([facility]) 01/25/2016  . DDD (degenerative disc disease), lumbar 02/20/2015  . Hyperthyroidism 02/20/2015  . HNP (herniated nucleus pulposus), lumbar 05/21/2012    Past Surgical History:  Procedure Laterality Date  . BACK SURGERY  05/2011   lumbar surgery at [facility]  . BILATERAL CARPAL TUNNEL RELEASE    . C SECTIONS X 3      Prior to Admission medications   Medication Sig Start Date End Date Taking? Authorizing Provider  albuterol (PROVENTIL HFA;VENTOLIN HFA) 108 (90 Base) MCG/ACT inhaler Inhale 1-2 puffs into  the lungs every 6 (six) hours as needed for wheezing or shortness of breath. 04/03/18   Wilhelmina Mcardle, MD  cyclobenzaprine (FLEXERIL) 10 MG tablet Take 1 tablet (10 mg total) by mouth 3 (three) times daily as needed for muscle spasms. 04/03/17   Lucille Passy, MD  simvastatin (ZOCOR) 10 MG tablet Take 1 tablet (10 mg total) by mouth at bedtime. SCHEDULE LAB VISIT Feb 2020 09/11/18   Jearld Fenton, NP  Tiotropium Bromide-Olodaterol (STIOLTO RESPIMAT) 2.5-2.5 MCG/ACT AERS Inhale 2 puffs into the lungs daily. 08/02/19   Flora Lipps, MD  umeclidinium-vilanterol (ANORO ELLIPTA) 62.5-25 MCG/INH AEPB Inhale 1 puff into the lungs daily.    [provider]    No Known Allergies  Family History  Problem Relation Age of Onset  . Heart disease Mother   . Heart disease Father   . Stroke Maternal Grandmother   . Diabetes Maternal Grandmother     Social History Social History   Tobacco Use  . Smoking status: Current Every Day Smoker    Packs/day: 0.50    Years: 30.00    Pack years: 15.00    Types: Cigarettes  . Smokeless tobacco: Never Used  Substance Use Topics  . Alcohol use: Yes    Alcohol/week: 0.0 standard drinks    Comment: rare  . Drug use: No    Review of Systems Constitutional: Negative for fever. Cardiovascular: Positive for chest pain, now resolved Respiratory:  Negative for shortness of breath. Gastrointestinal: Negative for abdominal pain Musculoskeletal: Negative for musculoskeletal complaints  Neurological: Negative for headache All other ROS negative  ____________________________________________   PHYSICAL EXAM:  VITAL SIGNS: ED Triage Vitals  Enc Vitals Group     BP 09/25/19 1639 (!) 152/73     Pulse Rate 09/25/19 1639 (!) 105     Resp 09/25/19 1639 20     Temp 09/25/19 1639 97.6 F (36.4 C)     Temp Source 09/25/19 1639 Oral     SpO2 09/25/19 1639 95 %     Weight 09/25/19 1640 120 lb (54.4 kg)     Height 09/25/19 1640 5\' 4"  (1.626 m)     Head  Circumference --      Peak Flow --      Pain Score 09/25/19 1640 4     Pain Loc --      Pain Edu? --      Excl. in [facility]? --     Constitutional: Alert and oriented. Well appearing and in no distress. Eyes: Normal exam ENT      Head: Normocephalic and atraumatic.      Mouth/Throat: Mucous membranes are moist. Cardiovascular: Normal rate, regular rhythm.  Respiratory: Normal respiratory effort without tachypnea nor retractions. Breath sounds are clear Gastrointestinal: Soft and nontender. No distention.   Musculoskeletal: Nontender with normal range of motion in all extremities.  No lower extremity edema. Neurologic:  Normal speech and language. No gross focal neurologic deficits Skin:  Skin is warm, dry and intact.  Psychiatric: Mood and affect are normal.   ____________________________________________    EKG  EKG viewed and interpreted by myself shows sinus tachycardia 110 bpm with a narrow QRS, largely normal intervals with nonspecific ST changes.  ____________________________________________    RADIOLOGY  Chest x-ray negative for acute abnormality.  ____________________________________________   INITIAL IMPRESSION / ASSESSMENT AND PLAN / ED COURSE  Pertinent labs & imaging results that were available during my care of the patient were reviewed by me and considered in my medical decision making (see chart for details).   Patient presents emergency department for chest pain starting around 1:30 PM today which is since completely resolved.  Patient has no personal history of cardiac disease but does state her father died of a myocardial infarction at 68 years old.  Patient's work-up today has been reassuring lab work is normal including 2 sets of cardiac enzymes.  Chest x-ray is negative EKG although is somewhat abnormal in appearance is largely unchanged from prior EKGs.  As the patient is chest pain-free with a reassuring work-up I believe she is safe for discharge home.   However given her family history I did discuss follow-up with a cardiologist for stress test.  Patient is agreeable to plan and will call Monday to arrange this.  I did discuss strict return precautions and to return to the emergency department for any return of her chest pain.  [facility] was evaluated in Emergency Department on 09/25/2019 for the symptoms described in the history of present illness. She was evaluated in the context of the global COVID-19 pandemic, which necessitated consideration that the patient might be at risk for infection with the SARS-CoV-2 virus that causes COVID-19. Institutional protocols and algorithms that pertain to the evaluation of patients at risk for COVID-19 are in a state of rapid change based on information released by regulatory bodies including the CDC and federal and state organizations. These policies and algorithms were followed during  the patient's care in the ED.  ____________________________________________   FINAL CLINICAL IMPRESSION(S) / ED DIAGNOSES  Chest pain   Harvest Dark, MD 09/25/19 1956

## 2019-09-25 NOTE — ED Triage Notes (Signed)
Mid chest pain since 1330 today. Denies fevers. No unusual cough.

## 2019-09-25 NOTE — ED Notes (Signed)
Pt ambulatory to XR

## 2019-10-04 ENCOUNTER — Telehealth: Payer: Self-pay | Admitting: Internal Medicine

## 2019-10-05 MED ORDER — STIOLTO RESPIMAT 2.5-2.5 MCG/ACT IN AERS: 2.0000 | INHALATION_SPRAY | Freq: Every day | RESPIRATORY_TRACT | 0 refills | Status: DC

## 2019-10-05 NOTE — Telephone Encounter (Signed)
Rx for Stiolto has been sent to preferred pharmacy.  Pt is aware and voiced her understanding. Nothing further is needed.  

## 2019-11-08 ENCOUNTER — Other Ambulatory Visit: Payer: Self-pay | Admitting: Internal Medicine

## 2019-11-08 MED ORDER — ALBUTEROL SULFATE HFA 108 (90 BASE) MCG/ACT IN AERS: 1.0000 | INHALATION_SPRAY | Freq: Four times a day (QID) | RESPIRATORY_TRACT | 2 refills | Status: DC | PRN

## 2019-11-08 MED ORDER — STIOLTO RESPIMAT 2.5-2.5 MCG/ACT IN AERS: 2.0000 | INHALATION_SPRAY | Freq: Every day | RESPIRATORY_TRACT | 0 refills | Status: DC

## 2019-11-08 NOTE — Telephone Encounter (Signed)
Rx for ventolin and stiolto has been sent to preferred pharmacy. Pt is aware and voiced her understanding.  Nothing further is needed.

## 2019-12-14 ENCOUNTER — Other Ambulatory Visit: Payer: Self-pay | Admitting: Internal Medicine

## 2019-12-14 ENCOUNTER — Other Ambulatory Visit: Payer: Self-pay

## 2019-12-14 MED ORDER — STIOLTO RESPIMAT 2.5-2.5 MCG/ACT IN AERS: 2.0000 | INHALATION_SPRAY | Freq: Every day | RESPIRATORY_TRACT | 0 refills | Status: DC

## 2019-12-14 NOTE — Telephone Encounter (Signed)
Pt made aware that her rx would be sent in to her preferred pharmacy. She verbalized her understanding. She also mentioned that she will call in mid march to schedule an appointment to f/u with DK. Nothing further needed.

## 2020-01-14 ENCOUNTER — Telehealth: Payer: Self-pay | Admitting: Pulmonary Disease

## 2020-01-14 MED ORDER — STIOLTO RESPIMAT 2.5-2.5 MCG/ACT IN AERS: 2.0000 | INHALATION_SPRAY | Freq: Every day | RESPIRATORY_TRACT | 0 refills | Status: DC

## 2020-01-14 NOTE — Telephone Encounter (Signed)
I called and spoke with the patient and advised her that she needs an appt. She states that she thought that she had one. I advised her that she didn't and I scheduled her for late April and have sent in refill of her Stiolto.

## 2020-02-14 ENCOUNTER — Other Ambulatory Visit: Payer: Self-pay | Admitting: Pulmonary Disease

## 2020-02-14 MED ORDER — STIOLTO RESPIMAT 2.5-2.5 MCG/ACT IN AERS: 2.0000 | INHALATION_SPRAY | Freq: Every day | RESPIRATORY_TRACT | 1 refills | Status: DC

## 2020-02-14 NOTE — Telephone Encounter (Signed)
I called and spoke with the patient to verify which inhaler and its been sent to the pharmacy.

## 2020-02-24 ENCOUNTER — Ambulatory Visit: Payer: Medicare Other | Admitting: Pulmonary Disease

## 2020-02-24 ENCOUNTER — Encounter: Payer: Self-pay | Admitting: Pulmonary Disease

## 2020-02-24 ENCOUNTER — Other Ambulatory Visit: Payer: Self-pay

## 2020-02-24 VITALS — BP 110/68 | HR 108 | Temp 98.0°F | Ht 64.0 in | Wt 108.6 lb

## 2020-02-24 DIAGNOSIS — J449 Chronic obstructive pulmonary disease, unspecified: Secondary | ICD-10-CM

## 2020-02-24 DIAGNOSIS — Z87891 Personal history of nicotine dependence: Secondary | ICD-10-CM

## 2020-02-24 DIAGNOSIS — R0602 Shortness of breath: Secondary | ICD-10-CM

## 2020-02-24 NOTE — Patient Instructions (Signed)
We are going to enroll you in the lung cancer screening program  We are scheduling breathing tests   See you in follow-up in 3 months time call sooner should any new problems arise

## 2020-02-24 NOTE — Progress Notes (Signed)
    Assessment & Plan:  1. Stage 3 severe COPD by GOLD classification (HCC) (Primary) - Ambulatory Referral for Lung Cancer Scre  2. Shortness of breath - Ambulatory Referral for Lung Cancer Scre  3. Former heavy cigarette smoker (20-39 per day) - Ambulatory Referral for Lung Cancer Scre   Patient Instructions  We are going to enroll you in the lung cancer screening program  We are scheduling breathing tests   See you in follow-up in 3 months time call sooner should any new problems arise  Please note: late entry documentation due to logistical difficulties during COVID-19 pandemic. This note is filed for information purposes only, and is not intended to be used for billing, nor does it represent the full scope/nature of the visit in question. Please see any associated scanned media linked to date of encounter for additional pertinent information.  Subjective:    HPI: [facility] is a 69 y.o. female presenting to the pulmonology clinic on 02/24/2020 with report of: Follow-up (Patient has been doing good since last visit in AUG. Patient is taking care of mother and she has a lot of fatigue and shortness of breath with exertion. Patient has productive cough with clear sputum. Patient quit smoking 2 weeks ago and feels like she is coughing more since then.)     Outpatient Encounter Medications as of 02/24/2020  Medication Sig   [DISCONTINUED] albuterol  (VENTOLIN  HFA) 108 (90 Base) MCG/ACT inhaler Inhale 1-2 puffs into the lungs every 6 (six) hours as needed for wheezing or shortness of breath.   [DISCONTINUED] cyclobenzaprine  (FLEXERIL ) 10 MG tablet Take 1 tablet (10 mg total) by mouth 3 (three) times daily as needed for muscle spasms.   [DISCONTINUED] Tiotropium Bromide -Olodaterol (STIOLTO RESPIMAT ) 2.5-2.5 MCG/ACT AERS Inhale 2 puffs into the lungs daily.   [DISCONTINUED] simvastatin  (ZOCOR ) 10 MG tablet Take 1 tablet (10 mg total) by mouth at bedtime. SCHEDULE LAB VISIT Feb  2020   [DISCONTINUED] umeclidinium-vilanterol (ANORO ELLIPTA ) 62.5-25 MCG/INH AEPB Inhale 1 puff into the lungs daily.   No facility-administered encounter medications on file as of 02/24/2020.      Objective:   Vitals:   02/24/20 1419  BP: 110/68  Pulse: (!) 108  Temp: 98 F (36.7 C)  Height: 5' 4 (1.626 m)  Weight: 108 lb 9.6 oz (49.3 kg)  SpO2: 97%  TempSrc: Temporal  BMI (Calculated): 18.63     Physical exam documentation is limited by delayed entry of information.

## 2020-03-07 ENCOUNTER — Other Ambulatory Visit: Admission: RE | Admit: 2020-03-07 | Payer: Medicare Other | Source: Ambulatory Visit

## 2020-03-07 ENCOUNTER — Encounter: Payer: Self-pay | Admitting: *Deleted

## 2020-03-08 ENCOUNTER — Ambulatory Visit: Payer: Medicare Other

## 2020-03-16 ENCOUNTER — Encounter: Payer: Self-pay | Admitting: *Deleted

## 2020-04-20 ENCOUNTER — Telehealth: Payer: Self-pay | Admitting: Internal Medicine

## 2020-04-20 NOTE — Telephone Encounter (Signed)
Patient called today requesting lab work She stated years ago she was diagnosed with hyperthyroidism.  She stated she recently weighed herself and was 108 She said she normally ranges 120/125. Patient wanted a message sent to see if she could have labs done to check her TSH

## 2020-04-20 NOTE — Telephone Encounter (Cosign Needed)
She has not been seen since 2019. She needs to schedule an appt to discuss

## 2020-04-21 NOTE — Telephone Encounter (Signed)
Left detail message on voicemail per DPR  Need to schedule appointment with [facility]

## 2020-04-25 NOTE — Telephone Encounter (Signed)
[place] Night - Client Nonclinical Telephone Record [facility]urse Client [facility] Night - Client Client Site [place] Physician Webb Silversmith - NP Contact Type Call Who Is Calling Patient / Member / Family / Caregiver Caller Name [place] Phone Number [phone] Patient Name [facility] Patient DOB 1950/12/12 Call Type Message Only Information Provided Reason for Call Request to Schedule Office Appointment Initial Comment Caller states she is having symptoms of hyperthyroidism which she has had in the past. She is looking for a lab order for testing. She states she needs to have labs done prior to the visit. Additional Comment She states she wants to speak to Dr. Garnette Gunner. Disp. Time Disposition Final User 04/24/2020 5:01:18 PM General Information Provided Yes Scharlene Corn Call Closed By: Scharlene Corn Transaction Date/Time: 04/24/2020 4:58:16 PM (ET)

## 2020-04-25 NOTE — Telephone Encounter (Signed)
Per DPR I left detailed v/m that R Baity NP has not seen pt since 2019 and pt needs to call for in office appt to discuss symptoms with R Baity NP and then needed labs would be scheduled at that time.

## 2020-05-15 NOTE — Telephone Encounter (Signed)
[place] Night - Client Nonclinical Telephone Record  [facility] Client [place] Night - Client Client Site [place] Physician Webb Silversmith - NP Contact Type Call Who Is Calling Patient / Member / Family / Caregiver Caller Name Sophronia Varney Caller Phone Number [phone] Patient Name [facility] Patient DOB [birth year] Call Type Message Only Information Provided Reason for Call Request to Schedule Office Appointment Initial Comment Caller needs to schedule appt with office. Disp. Time Disposition Final User 05/12/2020 3:54:15 PM General Information Provided Yes Renato Shin Call Closed By: Renato Shin Transaction Date/Time: 05/12/2020 3:51:21 PM (ET)

## 2020-07-07 ENCOUNTER — Telehealth: Payer: Self-pay

## 2020-07-07 NOTE — Telephone Encounter (Signed)
Attempted to reach patient for lung CT screening clinic based on referral from Dr. Patsey Berthold.  Message left for patient to call our clinic to make an appointment.

## 2020-07-19 ENCOUNTER — Encounter: Payer: Self-pay | Admitting: *Deleted

## 2020-07-19 NOTE — Progress Notes (Signed)
Thank you for attempting.Marland KitchenMarland Kitchen

## 2020-07-20 ENCOUNTER — Ambulatory Visit (INDEPENDENT_AMBULATORY_CARE_PROVIDER_SITE_OTHER): Payer: Medicare Other | Admitting: Internal Medicine

## 2020-07-20 ENCOUNTER — Other Ambulatory Visit: Payer: Self-pay

## 2020-07-20 ENCOUNTER — Encounter: Payer: Self-pay | Admitting: Internal Medicine

## 2020-07-20 VITALS — BP 118/78 | HR 111 | Temp 97.5°F | Ht 64.0 in | Wt 110.0 lb

## 2020-07-20 DIAGNOSIS — E0590 Thyrotoxicosis, unspecified without thyrotoxic crisis or storm: Secondary | ICD-10-CM

## 2020-07-20 DIAGNOSIS — F39 Unspecified mood [affective] disorder: Secondary | ICD-10-CM | POA: Diagnosis not present

## 2020-07-20 DIAGNOSIS — Z0000 Encounter for general adult medical examination without abnormal findings: Secondary | ICD-10-CM

## 2020-07-20 DIAGNOSIS — J439 Emphysema, unspecified: Secondary | ICD-10-CM | POA: Diagnosis not present

## 2020-07-20 DIAGNOSIS — Z1211 Encounter for screening for malignant neoplasm of colon: Secondary | ICD-10-CM

## 2020-07-20 DIAGNOSIS — M5136 Other intervertebral disc degeneration, lumbar region: Secondary | ICD-10-CM | POA: Diagnosis not present

## 2020-07-20 MED ORDER — VENLAFAXINE HCL ER 37.5 MG PO CP24: 37.5000 mg | ORAL_CAPSULE | Freq: Every day | ORAL | 2 refills | Status: DC

## 2020-07-20 NOTE — Assessment & Plan Note (Signed)
TSH and Free T4 today.

## 2020-07-20 NOTE — Progress Notes (Signed)
HPI:  Pt presents to the clinic today for her subsequent annual Medicare Wellness Exam. She is also due to follow up chronic conditions.  COPD: Pt reports chronic cough or SOB. She does continue to smoke. She is using Stiolto and Albuterol as prescribed. PFT's from reviewed. She follows with pulmonology.  DDD, Lumbar: s/p back surgery. Managed with Ibuprofen and Flexeril. She stretches daily. She does not follow with orthopedics or neurosurgery.  Hyperthyroidism: Not medicated. She does not follow with endocrinology.   She does report caregiver stress. She is caring for her elderly mother with dementia and this has proven difficult. She has taken Wellbutrin in the past, but reports this caused vivid dreams.   Past Medical History:  Diagnosis Date  . Abnormal EKG    HX OF PREVIOUS EKGS SHOWING POSSIBLE MI--BUT PT HAS NEVER HAD ANY HEART PROBLEMS--HAD BACK SURGERY AUG 2012 AT [facility] - NO HEART PROBLEMS  . Chronic cough    PT IS A SMOKER  . DDD (degenerative disc disease), lumbar    PAIN IN LOWER BACK AND DOWN RT HIP AND LEG--NUMBNESS, THROBBING, BURNING  . Hyperthyroidism 02/20/2015    Current Outpatient Medications  Medication Sig Dispense Refill  . albuterol (VENTOLIN HFA) 108 (90 Base) MCG/ACT inhaler Inhale 1-2 puffs into the lungs every 6 (six) hours as needed for wheezing or shortness of breath. 18 g 2  . cyclobenzaprine (FLEXERIL) 10 MG tablet Take 1 tablet (10 mg total) by mouth 3 (three) times daily as needed for muscle spasms. 30 tablet 0  . Tiotropium Bromide-Olodaterol (STIOLTO RESPIMAT) 2.5-2.5 MCG/ACT AERS Inhale 2 puffs into the lungs daily. 4 g 1   No current facility-administered medications for this visit.    No Known Allergies  Family History  Problem Relation Age of Onset  . Heart disease Mother   . Heart disease Father   . Stroke Maternal Grandmother   . Diabetes Maternal Grandmother     Social History   Socioeconomic History  . Marital status: Single     Spouse name: Not on file  . Number of children: Not on file  . Years of education: Not on file  . Highest education level: Not on file  Occupational History  . Not on file  Tobacco Use  . Smoking status: Former Smoker    Packs/day: 0.50    Years: 30.00    Pack years: 15.00    Types: Cigarettes    Quit date: 02/07/2020    Years since quitting: 0.4  . Smokeless tobacco: Never Used  Vaping Use  . Vaping Use: Never used  Substance and Sexual Activity  . Alcohol use: Yes    Alcohol/week: 0.0 standard drinks    Comment: rare  . Drug use: No  . Sexual activity: Not Currently  Other Topics Concern  . Not on file  Social History Narrative  . Not on file   Social Determinants of Health   Financial Resource Strain:   . Difficulty of Paying Living Expenses: Not on file  Food Insecurity:   . Worried About Charity fundraiser in the Last Year: Not on file  . Ran Out of Food in the Last Year: Not on file  Transportation Needs:   . Lack of Transportation (Medical): Not on file  . Lack of Transportation (Non-Medical): Not on file  Physical Activity:   . Days of Exercise per Week: Not on file  . Minutes of Exercise per Session: Not on file  Stress:   . Feeling  of Stress : Not on file  Social Connections:   . Frequency of Communication with Friends and Family: Not on file  . Frequency of Social Gatherings with Friends and Family: Not on file  . Attends Religious Services: Not on file  . Active Member of Clubs or Organizations: Not on file  . Attends Archivist Meetings: Not on file  . Marital Status: Not on file  Intimate Partner Violence:   . Fear of Current or Ex-Partner: Not on file  . Emotionally Abused: Not on file  . Physically Abused: Not on file  . Sexually Abused: Not on file    Hospitiliaztions: None  Health Maintenance:    Flu: 06/2019  Tetanus: unsure  Pneumovax: never  Prevnar:  07/2018  Covid: Moderna  Zostavax: never  Shingrix: never  Mammogram:  > 5 years ago  Pap Smear: 02/2015  Bone Density: > 5 years ago  Colon Screening: never  Eye Doctor: annually  Dental Exam: biannually   Providers:   PCP: Webb Silversmith, NP  Pulmonologist: Dr. Patsey Berthold   I have personally reviewed and have noted:  1. The patient's medical and social history 2. Their use of alcohol, tobacco or illicit drugs 3. Their current medications and supplements 4. The patient's functional ability including ADL's, fall risks, home safety risks and  hearing or visual impairment. 5. Diet and physical activities 6. Evidence for depression or mood disorder  Subjective:   Review of Systems:   Constitutional: Denies fever, malaise, fatigue, headache or abrupt weight changes.  HEENT: Denies eye pain, eye redness, ear pain, ringing in the ears, wax buildup, runny nose, nasal congestion, bloody nose, or sore throat. Respiratory: Pt reports chronic cough, shortness of breath. Denies difficulty breathing, or sputum production.   Cardiovascular: Denies chest pain, chest tightness, palpitations or swelling in the hands or feet.  Gastrointestinal: Denies abdominal pain, bloating, constipation, diarrhea or blood in the stool.  GU: Denies urgency, frequency, pain with urination, burning sensation, blood in urine, odor or discharge. Musculoskeletal: Pt reports chronic back pain. Denies decrease in range of motion, difficulty with gait, or joint swelling.  Skin: Denies redness, rashes, lesions or ulcercations.  Neurological: Denies dizziness, difficulty with memory, difficulty with speech or problems with balance and coordination.  Psych: Pt reports stress, anxiety and depression. Denies SI/HI.  No other specific complaints in a complete review of systems (except as listed in HPI above).  Objective:  PE:  BP 118/78   Pulse (!) 111   Temp (!) 97.5 F (36.4 C) (Temporal)   Ht 5\' 4"  (1.626 m)   Wt 110 lb (49.9 kg)   SpO2 94%   BMI 18.88 kg/m   Wt Readings from Last 3  Encounters:  02/24/20 108 lb 9.6 oz (49.3 kg)  09/25/19 120 lb (54.4 kg)  06/21/19 123 lb 6.4 oz (56 kg)    General: Appears her stated age, well developed, well nourished in NAD. Skin: Warm, dry and intact. No rashes noted. HEENT: Head: normal shape and size; Eyes: sclera white, no icterus, conjunctiva pink, PERRLA and EOMs intact;  Neck: Neck supple, trachea midline. No masses, lumps present.  Cardiovascular: Tachycardic with normal rhythm. S1,S2 noted.  No murmur, rubs or gallops noted. No JVD or BLE edema.  Pulmonary/Chest: Normal effort and diminished breath sounds. No respiratory distress. No wheezes, rales or ronchi noted.  Abdomen: Soft and nontender. Normal bowel sounds. No distention or masses noted. Liver, spleen and kidneys non palpable. Musculoskeletal: Pain with palpation over  the lumbar spine. Strength 5/5 BUE/BLE. No difficulty with gait. Neurological: Alert and oriented. Cranial nerves II-XII grossly intact. Coordination normal.  Psychiatric: Mildly anxious appearing. Behavior is normal. Judgment and thought content normal.    BMET    Component Value Date/Time   NA 139 09/25/2019 1642   NA 145 (H) 08/25/2018 1504   K 3.9 09/25/2019 1642   CL 100 09/25/2019 1642   CO2 28 09/25/2019 1642   GLUCOSE 135 (H) 09/25/2019 1642   BUN 10 09/25/2019 1642   BUN 15 08/25/2018 1504   CREATININE 0.66 09/25/2019 1642   CALCIUM 9.9 09/25/2019 1642   GFRNONAA >60 09/25/2019 1642   GFRAA >60 09/25/2019 1642    Lipid Panel     Component Value Date/Time   CHOL 240 (H) 08/25/2018 1504   TRIG 159 (H) 08/25/2018 1504   HDL 49 08/25/2018 1504   CHOLHDL 4.9 (H) 08/25/2018 1504   LDLCALC 159 (H) 08/25/2018 1504    CBC    Component Value Date/Time   WBC 11.4 (H) 09/25/2019 1642   RBC 5.42 (H) 09/25/2019 1642   HGB 16.9 (H) 09/25/2019 1642   HGB 16.2 (H) 08/25/2018 1504   HCT 48.6 (H) 09/25/2019 1642   HCT 48.8 (H) 08/25/2018 1504   PLT 212 09/25/2019 1642   PLT 234  08/25/2018 1504   MCV 89.7 09/25/2019 1642   MCV 94 08/25/2018 1504   MCH 31.2 09/25/2019 1642   MCHC 34.8 09/25/2019 1642   RDW 12.9 09/25/2019 1642   RDW 12.7 08/25/2018 1504   LYMPHSABS 4.0 05/15/2012 1525   MONOABS 0.8 05/15/2012 1525   EOSABS 0.3 05/15/2012 1525   BASOSABS 0.1 05/15/2012 1525    Hgb A1C Lab Results  Component Value Date   HGBA1C 5.9 (H) 10/01/2016      Assessment and Plan:   Medicare Annual Wellness Visit:  Diet: She does eat meat. She consumes fruits and veggies. She occasionally eats fried foods. She drinks mostly Coke. Physical activity: Sedentary Depression/mood screen: Negative, PHQ 9 score of 11 Hearing: Intact to whispered voice Visual acuity: Grossly normal, performs annual eye exam  ADLs: Capable Fall risk: None Home safety: Good Cognitive evaluation: Intact to orientation, naming, recall and repetition EOL planning: No adv directives, full code/ I agree  Preventative Medicine: Flu shot today. She declines tetanus due to financial reasons. Prevnar UTD. She wants to hold off on pneumovax at this time, advised her to get at the pharmacy. Covid vaccine UTD. Encouraged her to get a Shingrix vaccine. She declines mammogram, pap smear or bone density screening. She declines colonoscopy but is agreeable to Cologuard- ordered. Encouraged her to consume a balanced diet and exercise regimen. Advised her to see an eye doctor and dentist annually. Will check CBC, CMET, Lipid, TSH and Free T4 today.   Next appointment: 1 year, AWV   Webb Silversmith, NP Webb Silversmith, NP

## 2020-07-20 NOTE — Assessment & Plan Note (Signed)
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

## 2020-07-20 NOTE — Assessment & Plan Note (Signed)
Continue Ibuprofen and Flexeril Encouraged stretching She is thinking about following up with Dr. Maxie Better

## 2020-07-20 NOTE — Patient Instructions (Signed)

## 2020-07-20 NOTE — Assessment & Plan Note (Signed)
Continue Stiolto and Albuterol She will continue to follow with pulmonology

## 2020-07-21 LAB — COMPREHENSIVE METABOLIC PANEL
ALT: 10 IU/L (ref 0–32)
AST: 14 IU/L (ref 0–40)
Albumin/Globulin Ratio: 1.4 (ref 1.2–2.2)
Albumin: 4.5 g/dL (ref 3.8–4.8)
Alkaline Phosphatase: 94 IU/L (ref 44–121)
BUN/Creatinine Ratio: 18 (ref 12–28)
BUN: 14 mg/dL (ref 8–27)
Bilirubin Total: 0.3 mg/dL (ref 0.0–1.2)
CO2: 26 mmol/L (ref 20–29)
Calcium: 9.9 mg/dL (ref 8.7–10.3)
Chloride: 99 mmol/L (ref 96–106)
Creatinine, Ser: 0.76 mg/dL (ref 0.57–1.00)
GFR calc Af Amer: 93 mL/min/{1.73_m2} (ref >59)
GFR calc non Af Amer: 81 mL/min/{1.73_m2} (ref >59)
Globulin, Total: 3.2 g/dL (ref 1.5–4.5)
Glucose: 88 mg/dL (ref 65–99)
Potassium: 4.3 mmol/L (ref 3.5–5.2)
Sodium: 138 mmol/L (ref 134–144)
Total Protein: 7.7 g/dL (ref 6.0–8.5)

## 2020-07-21 LAB — LIPID PANEL
Chol/HDL Ratio: 5.3 ratio — ABNORMAL HIGH (ref 0.0–4.4)
Cholesterol, Total: 246 mg/dL — ABNORMAL HIGH (ref 100–199)
HDL: 46 mg/dL (ref >39)
LDL Chol Calc (NIH): 172 mg/dL — ABNORMAL HIGH (ref 0–99)
Triglycerides: 155 mg/dL — ABNORMAL HIGH (ref 0–149)
VLDL Cholesterol Cal: 28 mg/dL (ref 5–40)

## 2020-07-21 LAB — CBC
Hematocrit: 48.6 % — ABNORMAL HIGH (ref 34.0–46.6)
Hemoglobin: 16.9 g/dL — ABNORMAL HIGH (ref 11.1–15.9)
MCH: 31.4 pg (ref 26.6–33.0)
MCHC: 34.8 g/dL (ref 31.5–35.7)
MCV: 90 fL (ref 79–97)
Platelets: 239 10*3/uL (ref 150–450)
RBC: 5.38 x10E6/uL — ABNORMAL HIGH (ref 3.77–5.28)
RDW: 13.2 % (ref 11.7–15.4)
WBC: 11.8 10*3/uL — ABNORMAL HIGH (ref 3.4–10.8)

## 2020-07-21 LAB — T4, FREE: Free T4: 1.34 ng/dL (ref 0.82–1.77)

## 2020-07-21 LAB — TSH: TSH: 0.249 u[IU]/mL — ABNORMAL LOW (ref 0.450–4.500)

## 2020-07-31 ENCOUNTER — Encounter: Payer: Self-pay | Admitting: Internal Medicine

## 2020-08-09 DIAGNOSIS — D225 Melanocytic nevi of trunk: Secondary | ICD-10-CM | POA: Diagnosis not present

## 2020-08-09 DIAGNOSIS — X32XXXA Exposure to sunlight, initial encounter: Secondary | ICD-10-CM | POA: Diagnosis not present

## 2020-08-09 DIAGNOSIS — L570 Actinic keratosis: Secondary | ICD-10-CM | POA: Diagnosis not present

## 2020-08-09 DIAGNOSIS — L821 Other seborrheic keratosis: Secondary | ICD-10-CM | POA: Diagnosis not present

## 2020-08-09 DIAGNOSIS — D2262 Melanocytic nevi of left upper limb, including shoulder: Secondary | ICD-10-CM | POA: Diagnosis not present

## 2020-08-09 DIAGNOSIS — L728 Other follicular cysts of the skin and subcutaneous tissue: Secondary | ICD-10-CM | POA: Diagnosis not present

## 2020-08-09 DIAGNOSIS — D2261 Melanocytic nevi of right upper limb, including shoulder: Secondary | ICD-10-CM | POA: Diagnosis not present

## 2020-08-21 DIAGNOSIS — Z23 Encounter for immunization: Secondary | ICD-10-CM | POA: Diagnosis not present

## 2020-10-27 ENCOUNTER — Other Ambulatory Visit: Payer: Self-pay | Admitting: Internal Medicine

## 2020-10-27 DIAGNOSIS — F39 Unspecified mood [affective] disorder: Secondary | ICD-10-CM

## 2020-10-31 NOTE — Telephone Encounter (Signed)
Pt called in wanted to know about getting this prescription filled

## 2020-11-21 ENCOUNTER — Telehealth: Payer: Self-pay | Admitting: Pulmonary Disease

## 2020-11-21 MED ORDER — STIOLTO RESPIMAT 2.5-2.5 MCG/ACT IN AERS: 2.0000 | INHALATION_SPRAY | Freq: Every day | RESPIRATORY_TRACT | 2 refills | Status: DC

## 2020-11-21 NOTE — Telephone Encounter (Signed)
Spoke to patient, who is requesting Rx for [employer].  Rx has been sent to preferred pharmacy.  Patient is past due for an appointment. She stated that she would call back to schedule an appointment.  Nothing further needed at this time.

## 2021-02-06 ENCOUNTER — Telehealth: Payer: Self-pay | Admitting: Internal Medicine

## 2021-02-06 DIAGNOSIS — F39 Unspecified mood [affective] disorder: Secondary | ICD-10-CM

## 2021-02-06 NOTE — Telephone Encounter (Signed)
  LAST APPOINTMENT DATE: 10/27/2020   NEXT APPOINTMENT DATE:@Visit  date not found  MEDICATION:Venlafaxine XR 37.5 mg PHARMACY:[facility]  Let patient know to contact pharmacy at the end of the day to make sure medication is ready.  Please notify patient to allow 48-72 hours to process  Encourage patient to contact the pharmacy for refills or they can request refills through [facility]  Patient said she will schedule an appointment with Rollene Fare at [facility], but she only has 4 pills left.   CLINICAL FILLS OUT ALL BELOW:   LAST REFILL:  QTY:  REFILL DATE:    OTHER COMMENTS:    Okay for refill?  Please advise

## 2021-02-07 MED ORDER — VENLAFAXINE HCL ER 37.5 MG PO CP24: 37.5000 mg | ORAL_CAPSULE | Freq: Every day | ORAL | 0 refills | Status: DC

## 2021-03-13 DIAGNOSIS — M545 Low back pain, unspecified: Secondary | ICD-10-CM | POA: Diagnosis not present

## 2021-03-13 DIAGNOSIS — M47896 Other spondylosis, lumbar region: Secondary | ICD-10-CM | POA: Diagnosis not present

## 2021-03-13 DIAGNOSIS — M5136 Other intervertebral disc degeneration, lumbar region: Secondary | ICD-10-CM | POA: Diagnosis not present

## 2021-03-18 ENCOUNTER — Other Ambulatory Visit: Payer: Self-pay | Admitting: Pulmonary Disease

## 2021-03-19 ENCOUNTER — Telehealth: Payer: Self-pay | Admitting: Pulmonary Disease

## 2021-03-19 MED ORDER — STIOLTO RESPIMAT 2.5-2.5 MCG/ACT IN AERS: 2.0000 | INHALATION_SPRAY | Freq: Every day | RESPIRATORY_TRACT | 0 refills | Status: DC

## 2021-03-19 NOTE — Telephone Encounter (Signed)
Patient is past due for an appt. She is aware that an appt is needed or further refills.  appt scheduled for 04/10/2021 at 12:00. One month supply of Stiolto has been sent to preferred pharmacy.  Nothing further needed at this time.

## 2021-03-27 ENCOUNTER — Other Ambulatory Visit: Payer: Self-pay | Admitting: Rehabilitation

## 2021-03-27 DIAGNOSIS — M47896 Other spondylosis, lumbar region: Secondary | ICD-10-CM

## 2021-03-27 DIAGNOSIS — M5136 Other intervertebral disc degeneration, lumbar region: Secondary | ICD-10-CM

## 2021-04-10 ENCOUNTER — Other Ambulatory Visit: Payer: Self-pay

## 2021-04-10 ENCOUNTER — Ambulatory Visit (INDEPENDENT_AMBULATORY_CARE_PROVIDER_SITE_OTHER): Payer: Medicare Other | Admitting: Adult Health

## 2021-04-10 ENCOUNTER — Encounter: Payer: Self-pay | Admitting: Adult Health

## 2021-04-10 VITALS — BP 116/72 | HR 100 | Temp 97.1°F | Ht 64.0 in | Wt 110.0 lb

## 2021-04-10 DIAGNOSIS — J449 Chronic obstructive pulmonary disease, unspecified: Secondary | ICD-10-CM | POA: Diagnosis not present

## 2021-04-10 DIAGNOSIS — E46 Unspecified protein-calorie malnutrition: Secondary | ICD-10-CM

## 2021-04-10 DIAGNOSIS — F17210 Nicotine dependence, cigarettes, uncomplicated: Secondary | ICD-10-CM | POA: Diagnosis not present

## 2021-04-10 DIAGNOSIS — Z87891 Personal history of nicotine dependence: Secondary | ICD-10-CM

## 2021-04-10 DIAGNOSIS — Z720 Tobacco use: Secondary | ICD-10-CM | POA: Insufficient documentation

## 2021-04-10 MED ORDER — TRELEGY ELLIPTA 100-62.5-25 MCG/INH IN AEPB: 1.0000 | INHALATION_SPRAY | Freq: Every day | RESPIRATORY_TRACT | 0 refills | Status: AC

## 2021-04-10 MED ORDER — TRELEGY ELLIPTA 100-62.5-25 MCG/INH IN AEPB: 1.0000 | INHALATION_SPRAY | Freq: Every day | RESPIRATORY_TRACT | 11 refills | Status: AC

## 2021-04-10 NOTE — Assessment & Plan Note (Signed)
rec high protein diet

## 2021-04-10 NOTE — Patient Instructions (Signed)
Stop Stiolto , Begin TRELEGY 1 puff daily .  Albuterol inhaler As needed   Activity as tolerated  High protein diet  Continue to work on not smoking .  Refer to Lung cancer screening program.  Follow up with Dr. Patsey Berthold in 3 months and As needed

## 2021-04-10 NOTE — Progress Notes (Signed)
@Patient  ID: Pamela Haley, female    DOB: 1951-10-22, 70 y.o.   MRN: [id number]  Chief Complaint  Patient presents with   Follow-up    Referring provider: Jearld Fenton, NP  HPI: 70 year old female active smoker followed for severe COPD.  TEST/EVENTS :  07/29/17 PFTs: Mod-severe obstruction (FEV1 1.19 L, 49%), hyperinflation and gas trapping  - TLC 6.80 L (135% pred), RV 3.78 L (192%), DLCO 50% predicted  04/10/2021 Follow up : COPD  Patient presents for a 1 year follow-up.  Patient has severe COPD.  She has recently restarted smoking.  She says she did quit again last week.  We discussed in detail smoking cessation.  She does wish to quit.  We went over helpful hints to help her with smoking cessation and offered resources. She remains on Stiolto daily.  She says she does feel that her breathing has been getting worse with increased shortness of breath intermittently.  She denies any hemoptysis, chest pain, weight loss. We discussed the low-dose CT screening program.  She is interested and would like to participate.  No Known Allergies  Immunization History  Administered Date(s) Administered   Fluad Quad(high Dose 65+) 07/26/2019   Influenza, High Dose Seasonal PF 08/25/2018   Influenza,inj,Quad PF,6+ Mos 09/11/2016   Moderna SARS-COV2 Booster Vaccination 08/21/2020   Moderna Sars-Covid-2 Vaccination 12/02/2019, 12/29/2019   Pneumococcal Conjugate-13 08/25/2018    Past Medical History:  Diagnosis Date   Abnormal EKG    HX OF PREVIOUS EKGS SHOWING POSSIBLE MI--BUT PT HAS NEVER HAD ANY HEART PROBLEMS--HAD BACK SURGERY AUG 2012 AT [facility] - NO HEART PROBLEMS   Chronic cough    PT IS A SMOKER   DDD (degenerative disc disease), lumbar    PAIN IN LOWER BACK AND DOWN RT HIP AND LEG--NUMBNESS, THROBBING, BURNING   Hyperthyroidism 02/20/2015    Tobacco History: Social History   Tobacco Use  Smoking Status Former   Packs/day: 2.00   Years: 30.00   Pack years: 60.00    Types: Cigarettes   Quit date: 04/02/2021   Years since quitting: 0.0  Smokeless Tobacco Never   Counseling given: Not Answered   Outpatient Medications Prior to Visit  Medication Sig Dispense Refill   albuterol (VENTOLIN HFA) 108 (90 Base) MCG/ACT inhaler Inhale 1-2 puffs into the lungs every 6 (six) hours as needed for wheezing or shortness of breath. 18 g 2   venlafaxine XR (EFFEXOR-XR) 37.5 MG 24 hr capsule Take 1 capsule (37.5 mg total) by mouth daily with breakfast. 90 capsule 0   Tiotropium Bromide-Olodaterol (STIOLTO RESPIMAT) 2.5-2.5 MCG/ACT AERS Inhale 2 puffs into the lungs daily. 4 g 0   pregabalin (LYRICA) 50 MG capsule Take 50 mg by mouth 2 (two) times daily.     No facility-administered medications prior to visit.     Review of Systems:   Constitutional:   No  weight loss, night sweats,  Fevers, chills,  +fatigue, or  lassitude.  HEENT:   No headaches,  Difficulty swallowing,  Tooth/dental problems, or  Sore throat,                No sneezing, itching, ear ache, nasal congestion, post nasal drip,   CV:  No chest pain,  Orthopnea, PND, swelling in lower extremities, anasarca, dizziness, palpitations, syncope.   GI  No heartburn, indigestion, abdominal pain, nausea, vomiting, diarrhea, change in bowel habits, loss of appetite, bloody stools.   Resp: .  No chest wall deformity  Skin: no  rash or lesions.  GU: no dysuria, change in color of urine, no urgency or frequency.  No flank pain, no hematuria   MS:  No joint pain or swelling.  No decreased range of motion.  No back pain.    Physical Exam  BP 116/72 (BP Location: Left Arm, Cuff Size: Normal)   Pulse 100   Temp (!) 97.1 F (36.2 C) (Temporal)   Ht 5\' 4"  (1.626 m)   Wt 110 lb (49.9 kg)   SpO2 96%   BMI 18.88 kg/m   GEN: A/Ox3; pleasant , NAD, thin    HEENT:  [place]/AT,    NOSE-clear, THROAT-clear, no lesions, no postnasal drip or exudate noted.   NECK:  Supple w/ fair ROM; no JVD; normal carotid  impulses w/o bruits; no thyromegaly or nodules palpated; no lymphadenopathy.    RESP  Clear  P & A; w/o, wheezes/ rales/ or rhonchi. no accessory muscle use, no dullness to percussion  CARD:  RRR, no m/r/g, no peripheral edema, pulses intact, no cyanosis or clubbing.  GI:   Soft & nt; nml bowel sounds; no organomegaly or masses detected.   Musco: Warm bil, no deformities or joint swelling noted.   Neuro: alert, no focal deficits noted.    Skin: Warm, no lesions or rashes        BNP No results found for: BNP  ProBNP No results found for: PROBNP  Imaging: No results found.    No flowsheet data found.  No results found for: NITRICOXIDE      Assessment & Plan:   COPD (chronic obstructive pulmonary disease) (HCC) Severe COPD - will increase to triple therapy for maintenance to help with symptom burden.  Refer to LDCT chest  Smoking cessation is key   Plan  Patient Instructions  Stop Stiolto , Begin TRELEGY 1 puff daily .  Albuterol inhaler As needed   Activity as tolerated  High protein diet  Continue to work on not smoking .  Refer to Lung cancer screening program.  Follow up with Dr. Patsey Berthold in 3 months and As needed         Protein calorie malnutrition ([place]) rec high protein diet   Tobacco abuse Smoking cessation discussed     I spent   38  minutes dedicated to the care of this patient on the date of this encounter to include pre-visit review of records, face-to-face time with the patient discussing conditions above, post visit ordering of testing, clinical documentation with the electronic health record, making appropriate referrals as documented, and communicating necessary findings to members of the patients care team.   Rexene Edison, NP 04/10/2021

## 2021-04-10 NOTE — Assessment & Plan Note (Signed)
Smoking cessation discussed 

## 2021-04-10 NOTE — Assessment & Plan Note (Signed)
Severe COPD - will increase to triple therapy for maintenance to help with symptom burden.  Refer to LDCT chest  Smoking cessation is key   Plan  Patient Instructions  Stop Stiolto , Begin TRELEGY 1 puff daily .  Albuterol inhaler As needed   Activity as tolerated  High protein diet  Continue to work on not smoking .  Refer to Lung cancer screening program.  Follow up with Dr. Patsey Berthold in 3 months and As needed

## 2021-04-16 ENCOUNTER — Other Ambulatory Visit: Payer: Self-pay

## 2021-04-16 ENCOUNTER — Ambulatory Visit
Admission: RE | Admit: 2021-04-16 | Discharge: 2021-04-16 | Disposition: A | Discharge status: Home / Self Care | Payer: Medicare Other | Source: Ambulatory Visit | Attending: Rehabilitation | Admitting: Rehabilitation

## 2021-04-16 DIAGNOSIS — M48061 Spinal stenosis, lumbar region without neurogenic claudication: Secondary | ICD-10-CM | POA: Diagnosis not present

## 2021-04-16 DIAGNOSIS — M5136 Other intervertebral disc degeneration, lumbar region: Secondary | ICD-10-CM | POA: Diagnosis not present

## 2021-04-16 DIAGNOSIS — M47896 Other spondylosis, lumbar region: Secondary | ICD-10-CM | POA: Diagnosis not present

## 2021-04-16 DIAGNOSIS — M4316 Spondylolisthesis, lumbar region: Secondary | ICD-10-CM | POA: Diagnosis not present

## 2021-04-16 DIAGNOSIS — M4807 Spinal stenosis, lumbosacral region: Secondary | ICD-10-CM | POA: Diagnosis not present

## 2021-04-16 DIAGNOSIS — M5116 Intervertebral disc disorders with radiculopathy, lumbar region: Secondary | ICD-10-CM | POA: Diagnosis not present

## 2021-04-24 DIAGNOSIS — M47896 Other spondylosis, lumbar region: Secondary | ICD-10-CM | POA: Diagnosis not present

## 2021-04-24 DIAGNOSIS — M5136 Other intervertebral disc degeneration, lumbar region: Secondary | ICD-10-CM | POA: Diagnosis not present

## 2021-04-24 DIAGNOSIS — M48062 Spinal stenosis, lumbar region with neurogenic claudication: Secondary | ICD-10-CM | POA: Diagnosis not present

## 2021-05-02 DIAGNOSIS — M48062 Spinal stenosis, lumbar region with neurogenic claudication: Secondary | ICD-10-CM | POA: Diagnosis not present

## 2021-05-11 ENCOUNTER — Other Ambulatory Visit: Payer: Self-pay | Admitting: Rehabilitation

## 2021-05-11 DIAGNOSIS — E279 Disorder of adrenal gland, unspecified: Secondary | ICD-10-CM

## 2021-05-16 DIAGNOSIS — M48062 Spinal stenosis, lumbar region with neurogenic claudication: Secondary | ICD-10-CM | POA: Diagnosis not present

## 2021-05-16 DIAGNOSIS — M47816 Spondylosis without myelopathy or radiculopathy, lumbar region: Secondary | ICD-10-CM | POA: Diagnosis not present

## 2021-05-23 DIAGNOSIS — M48062 Spinal stenosis, lumbar region with neurogenic claudication: Secondary | ICD-10-CM | POA: Diagnosis not present

## 2021-05-31 ENCOUNTER — Ambulatory Visit
Admission: RE | Admit: 2021-05-31 | Discharge: 2021-05-31 | Disposition: A | Discharge status: Home / Self Care | Payer: Medicare Other | Source: Ambulatory Visit | Attending: Rehabilitation | Admitting: Rehabilitation

## 2021-05-31 DIAGNOSIS — E279 Disorder of adrenal gland, unspecified: Secondary | ICD-10-CM

## 2021-05-31 DIAGNOSIS — D3502 Benign neoplasm of left adrenal gland: Secondary | ICD-10-CM | POA: Diagnosis not present

## 2021-05-31 MED ORDER — GADOBENATE DIMEGLUMINE 529 MG/ML IV SOLN
10.0000 mL | Freq: Once | INTRAVENOUS | Status: AC | PRN
  Administered 2021-05-31: 10 mL via INTRAVENOUS

## 2021-06-13 DIAGNOSIS — M4316 Spondylolisthesis, lumbar region: Secondary | ICD-10-CM | POA: Diagnosis not present

## 2021-06-13 DIAGNOSIS — M5136 Other intervertebral disc degeneration, lumbar region: Secondary | ICD-10-CM | POA: Diagnosis not present

## 2021-06-13 DIAGNOSIS — M48062 Spinal stenosis, lumbar region with neurogenic claudication: Secondary | ICD-10-CM | POA: Diagnosis not present

## 2021-07-10 DIAGNOSIS — Z23 Encounter for immunization: Secondary | ICD-10-CM | POA: Diagnosis not present

## 2021-07-19 ENCOUNTER — Other Ambulatory Visit: Payer: Self-pay

## 2021-07-19 ENCOUNTER — Ambulatory Visit
Admission: RE | Admit: 2021-07-19 | Discharge: 2021-07-19 | Disposition: A | Discharge status: Home / Self Care | Payer: Medicare Other | Source: Ambulatory Visit | Attending: Adult Health | Admitting: Adult Health

## 2021-07-19 ENCOUNTER — Ambulatory Visit (INDEPENDENT_AMBULATORY_CARE_PROVIDER_SITE_OTHER): Payer: Medicare Other | Admitting: Adult Health

## 2021-07-19 ENCOUNTER — Telehealth: Payer: Self-pay | Admitting: Adult Health

## 2021-07-19 ENCOUNTER — Encounter: Payer: Self-pay | Admitting: Adult Health

## 2021-07-19 VITALS — Temp 97.3°F | Ht 65.0 in | Wt 114.6 lb

## 2021-07-19 DIAGNOSIS — J449 Chronic obstructive pulmonary disease, unspecified: Secondary | ICD-10-CM

## 2021-07-19 DIAGNOSIS — Z01811 Encounter for preprocedural respiratory examination: Secondary | ICD-10-CM

## 2021-07-19 DIAGNOSIS — Z720 Tobacco use: Secondary | ICD-10-CM | POA: Diagnosis not present

## 2021-07-19 DIAGNOSIS — R053 Chronic cough: Secondary | ICD-10-CM | POA: Diagnosis not present

## 2021-07-19 DIAGNOSIS — J439 Emphysema, unspecified: Secondary | ICD-10-CM | POA: Diagnosis not present

## 2021-07-19 DIAGNOSIS — E46 Unspecified protein-calorie malnutrition: Secondary | ICD-10-CM | POA: Diagnosis not present

## 2021-07-19 MED ORDER — ALBUTEROL SULFATE HFA 108 (90 BASE) MCG/ACT IN AERS: 1.0000 | INHALATION_SPRAY | Freq: Four times a day (QID) | RESPIRATORY_TRACT | 2 refills | Status: DC | PRN

## 2021-07-19 MED ORDER — TRELEGY ELLIPTA 100-62.5-25 MCG/INH IN AEPB: 1.0000 | INHALATION_SPRAY | Freq: Every day | RESPIRATORY_TRACT | 0 refills | Status: DC

## 2021-07-19 MED ORDER — TRELEGY ELLIPTA 100-62.5-25 MCG/INH IN AEPB: 1.0000 | INHALATION_SPRAY | Freq: Every day | RESPIRATORY_TRACT | 5 refills | Status: DC

## 2021-07-19 NOTE — Assessment & Plan Note (Signed)
High-protein diet 

## 2021-07-19 NOTE — Assessment & Plan Note (Signed)
Smoking cessation.  Refer to the low-dose CT screening program

## 2021-07-19 NOTE — Progress Notes (Signed)
Agree with the details of the visit as noted by Tammy Parrett, NP.  C. Laura Georgio Hattabaugh, MD [facility] 

## 2021-07-19 NOTE — Telephone Encounter (Signed)
Patient was referred to lung cancer screening in June.  Patient has not be contacted to schedule.   Langley Gauss, please advise. Thanks

## 2021-07-19 NOTE — Assessment & Plan Note (Signed)
Preop pulmonary risk assessment.  Patient has underlying very severe to severe COPD.  Spirometry today in the office shows a significant drop in lung function will need to set up for formal PFTs to verify results.  Chest x-ray today.  Smoking cessation is key.  Have advised patient she would be a high risk candidate for surgery with potential pulmonary complications postop.  Her FEV1 is less than 30%.  We will recheck PFTs and review in detail with patient once they are available.  Chest x-ray today. We went over potential surgical and postop risk.  Major Pulmonary risks identified in the multifactorial risk analysis are but not limited to a) pneumonia; b) recurrent intubation risk; c) prolonged or recurrent acute respiratory failure needing mechanical ventilation; d) prolonged hospitalization; e) DVT/Pulmonary embolism; f) Acute Pulmonary edema

## 2021-07-19 NOTE — Progress Notes (Addendum)
@Patient  ID: Pamela Haley, female    DOB: 30-May-1951, 70 y.o.   MRN: [id number]  Chief Complaint  Patient presents with   Follow-up    Referring provider: Jearld Fenton, NP  HPI: 70 year old female active smoker followed for severe COPD  TEST/EVENTS :  07/29/17 PFTs: Mod-severe obstruction (FEV1 1.19 L, 49%), hyperinflation and gas trapping  - TLC 6.80 L (135% pred), RV 3.78 L (192%), DLCO 50% predicted  07/20/2021 Follow up : COPD  Patient returns for a 63-month follow-up. Patient has underlying severe COPD.  Last visit patient was changed from [place] to Trelegy inhaler daily.  Patient says she does feel like it is working slightly better.  She still gets short of breath with activities.  But has had no flare of cough or wheezing.  No increased use of her albuterol.  Needs a refill of Trelegy and albuterol.  .  She is limited in what she can do due to severe back pain. She was referred to the low-dose CT screening program last visit. Has not set up with the program yet. Spirometry today shows decreased lung function with FEV1 decreased at 29%, ratio 42, FVC 52%.  As above patient does have chronic back pain.  She is planning to undergo back surgery with a revision of L2 S1 in the near future by Dr. Patrice Paradise.  She is here for a pulmonary preop risk assessment.  Lives at home, drives. Does have shopping and personal care. Has trouble with activities due to back pain .  Has not smoked x 1 week, cessation discussed. She is fully vaccinated. Not on oxygen . No recent antibiotics or steroids.     No Known Allergies  Immunization History  Administered Date(s) Administered   Fluad Quad(high Dose 65+) 07/26/2019   Influenza, High Dose Seasonal PF 08/25/2018   Influenza,inj,Quad PF,6+ Mos 09/11/2016   Moderna SARS-COV2 Booster Vaccination 08/21/2020   Moderna Sars-Covid-2 Vaccination 12/02/2019, 12/29/2019   Pneumococcal Conjugate-13 08/25/2018    Past Medical History:  Diagnosis  Date   Abnormal EKG    HX OF PREVIOUS EKGS SHOWING POSSIBLE MI--BUT PT HAS NEVER HAD ANY HEART PROBLEMS--HAD BACK SURGERY AUG 2012 AT [facility] - NO HEART PROBLEMS   Chronic cough    PT IS A SMOKER   DDD (degenerative disc disease), lumbar    PAIN IN LOWER BACK AND DOWN RT HIP AND LEG--NUMBNESS, THROBBING, BURNING   Hyperthyroidism 02/20/2015    Tobacco History: Social History   Tobacco Use  Smoking Status Former   Packs/day: 2.00   Years: 30.00   Pack years: 60.00   Types: Cigarettes   Quit date: 04/02/2021   Years since quitting: 0.2  Smokeless Tobacco Never  Tobacco Comments   Pt stated quit and started back and quit 1 week ago//07/19/21   Counseling given: Not Answered Tobacco comments: Pt stated quit and started back and quit 1 week ago//07/19/21   Outpatient Medications Prior to Visit  Medication Sig Dispense Refill   pregabalin (LYRICA) 50 MG capsule Take 50 mg by mouth 2 (two) times daily.     albuterol (VENTOLIN HFA) 108 (90 Base) MCG/ACT inhaler Inhale 1-2 puffs into the lungs every 6 (six) hours as needed for wheezing or shortness of breath. 18 g 2   venlafaxine XR (EFFEXOR-XR) 37.5 MG 24 hr capsule Take 1 capsule (37.5 mg total) by mouth daily with breakfast. (Patient not taking: Reported on 07/19/2021) 90 capsule 0   No facility-administered medications prior to visit.  Review of Systems:   Constitutional:   No  weight loss, night sweats,  Fevers, chills,  +fatigue, or  lassitude.  HEENT:   No headaches,  Difficulty swallowing,  Tooth/dental problems, or  Sore throat,                No sneezing, itching, ear ache, nasal congestion, post nasal drip,   CV:  No chest pain,  Orthopnea, PND, swelling in lower extremities, anasarca, dizziness, palpitations, syncope.   GI  No heartburn, indigestion, abdominal pain, nausea, vomiting, diarrhea, change in bowel habits, loss of appetite, bloody stools.   Resp: No excess mucus, no productive cough,  No non-productive  cough,  No coughing up of blood.  No change in color of mucus.  No wheezing.  No chest wall deformity  Skin: no rash or lesions.  GU: no dysuria, change in color of urine, no urgency or frequency.  No flank pain, no hematuria   MS:  No joint pain or swelling.  No decreased range of motion.  +back pain.    Physical Exam  Temp (!) 97.3 F (36.3 C) (Temporal)   Ht 5\' 5"  (1.651 m)   Wt 114 lb 9.6 oz (52 kg)   SpO2 93%   BMI 19.07 kg/m   GEN: A/Ox3; pleasant , NAD, thin    HEENT:  [place]/AT,  , NOSE-clear, THROAT-clear, no lesions, no postnasal drip or exudate noted.   NECK:  Supple w/ fair ROM; no JVD; normal carotid impulses w/o bruits; no thyromegaly or nodules palpated; no lymphadenopathy.    RESP  Clear  P & A; w/o, wheezes/ rales/ or rhonchi. no accessory muscle use, no dullness to percussion  CARD:  RRR, no m/r/g, no peripheral edema, pulses intact, no cyanosis or clubbing.  GI:   Soft & nt; nml bowel sounds; no organomegaly or masses detected.   Musco: Warm bil, no deformities or joint swelling noted.   Neuro: alert, no focal deficits noted.    Skin: Warm, no lesions or rashes    Lab Results:    BNP No results found for: BNP  ProBNP No results found for: PROBNP  Imaging: DG Chest 2 View  Result Date: 07/19/2021 CLINICAL DATA:  Chronic COPD, chronic cough, smoker EXAM: CHEST - 2 VIEW COMPARISON:  09/25/2019 FINDINGS: Normal heart size, mediastinal contours, and pulmonary vascularity. Atherosclerotic calcification aorta. Emphysematous and minimal bronchitic changes consistent with COPD. No pulmonary infiltrate, pleural effusion, or pneumothorax. Minimal LEFT basilar scarring. BILATERAL breast prostheses and nipple shadows unchanged. Diffuse osseous demineralization. IMPRESSION: COPD changes without acute abnormality. Aortic Atherosclerosis (ICD10-I70.0) and Emphysema (ICD10-J43.9). Electronically Signed   By: Lavonia Dana M.D.   On: 07/19/2021 17:44      No  flowsheet data found.  No results found for: NITRICOXIDE      Assessment & Plan:   COPD (chronic obstructive pulmonary disease) ([place]) Very severe COPD spirometry today in  in office shows a significant drop in lung function.  Patient is encouraged on ongoing smoking cessation.  We will set patient up for formal PFTs to verify results. Chest x-ray today.  Referral to the low-dose CT screening program Smoking cessation is discussed  Plan  Patient Instructions  Continue on TRELEGY 1 puff daily .  Albuterol inhaler As needed   Activity as tolerated  High protein diet  Continue to work on not smoking .  Refer to Lung cancer screening program.  Chest xray today .  PFTs -once available will call regarding surgical risk  assessment  Follow up with Dr. Patsey Berthold in 3 months and As needed         Protein calorie malnutrition ([place]) High-protein diet  Tobacco abuse Smoking cessation.  Refer to the low-dose CT screening program  Preop pulmonary/respiratory exam Preop pulmonary risk assessment.  Patient has underlying very severe to severe COPD.  Spirometry today in the office shows a significant drop in lung function will need to set up for formal PFTs to verify results.  Chest x-ray today.  Smoking cessation is key.  Have advised patient she would be a high risk candidate for surgery with potential pulmonary complications postop.  Her FEV1 is less than 30%.  We will recheck PFTs and review in detail with patient once they are available.  Chest x-ray today. We went over potential surgical and postop risk.  Major Pulmonary risks identified in the multifactorial risk analysis are but not limited to a) pneumonia; b) recurrent intubation risk; c) prolonged or recurrent acute respiratory failure needing mechanical ventilation; d) prolonged hospitalization; e) DVT/Pulmonary embolism; f) Acute Pulmonary edema    I spent   41 minutes dedicated to the care of this patient on the date of this  encounter to include pre-visit review of records, face-to-face time with the patient discussing conditions above, post visit ordering of testing, clinical documentation with the electronic health record, making appropriate referrals as documented, and communicating necessary findings to members of the patients care team.    Rexene Edison, NP 07/20/2021

## 2021-07-19 NOTE — Patient Instructions (Addendum)
Continue on TRELEGY 1 puff daily .  Albuterol inhaler As needed   Activity as tolerated  High protein diet  Continue to work on not smoking .  Refer to Lung cancer screening program.  Chest xray today .  PFTs -once available will call regarding surgical risk assessment  Follow up with Dr. Patsey Berthold in 3 months and As needed

## 2021-07-19 NOTE — Assessment & Plan Note (Addendum)
Very severe COPD spirometry today in  in office shows a significant drop in lung function.  Patient is encouraged on ongoing smoking cessation.  We will set patient up for formal PFTs to verify results. Chest x-ray today.  Referral to the low-dose CT screening program Smoking cessation is discussed  Plan  Patient Instructions  Continue on TRELEGY 1 puff daily .  Albuterol inhaler As needed   Activity as tolerated  High protein diet  Continue to work on not smoking .  Refer to Lung cancer screening program.  Chest xray today .  PFTs -once available will call regarding surgical risk assessment  Follow up with Dr. Patsey Berthold in 3 months and As needed

## 2021-07-20 ENCOUNTER — Ambulatory Visit (INDEPENDENT_AMBULATORY_CARE_PROVIDER_SITE_OTHER): Payer: Medicare Other | Admitting: Internal Medicine

## 2021-07-20 ENCOUNTER — Encounter: Payer: Self-pay | Admitting: Internal Medicine

## 2021-07-20 VITALS — BP 146/60 | HR 75 | Temp 97.8°F | Resp 18 | Ht 65.0 in | Wt 115.0 lb

## 2021-07-20 DIAGNOSIS — J431 Panlobular emphysema: Secondary | ICD-10-CM | POA: Diagnosis not present

## 2021-07-20 DIAGNOSIS — E782 Mixed hyperlipidemia: Secondary | ICD-10-CM | POA: Diagnosis not present

## 2021-07-20 DIAGNOSIS — Z01818 Encounter for other preprocedural examination: Secondary | ICD-10-CM | POA: Diagnosis not present

## 2021-07-20 DIAGNOSIS — R7303 Prediabetes: Secondary | ICD-10-CM | POA: Insufficient documentation

## 2021-07-20 DIAGNOSIS — E785 Hyperlipidemia, unspecified: Secondary | ICD-10-CM | POA: Insufficient documentation

## 2021-07-20 DIAGNOSIS — M5136 Other intervertebral disc degeneration, lumbar region: Secondary | ICD-10-CM | POA: Diagnosis not present

## 2021-07-20 DIAGNOSIS — M51369 Other intervertebral disc degeneration, lumbar region without mention of lumbar back pain or lower extremity pain: Secondary | ICD-10-CM

## 2021-07-20 NOTE — Telephone Encounter (Signed)
Attempted to contact pt. Call was blocked and unable to leave message. Margie in [place] called pt and gave her my number to call . Will await call back.

## 2021-07-20 NOTE — Patient Instructions (Signed)

## 2021-07-20 NOTE — Assessment & Plan Note (Signed)
C-Met and lipid profile today

## 2021-07-20 NOTE — Progress Notes (Signed)
Subjective:    Patient ID: Pamela Haley, female    DOB: Nov 21, 1950, 70 y.o.   MRN: [id number]  HPI  Patient presents the clinic today for surgical clearance.  She is having a revision of her L2-S1 performed by Dr. Rennis Harding on. MRI lumbar spine from 6/20222 showed:  IMPRESSION: 1. At L3-L4, moderate canal and moderate to severe right subarticular recess stenosis. Mild bilateral foraminal stenosis at this level. 2. At L2-L3, moderate canal stenosis and mild right foraminal stenosis. 3. PLIF at L4-L5 without significant stenosis at these levels. 4. 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 Recommend MRI adrenal protocol with contrast to fully characterize.  She describes the pain as sharp and stabbing. The pain is radiating into her right hip. She denies numbness, tingling or weakness. The pain is worse with standing for long periods of time, and walking. She is taking Ibuprofen and Flexeril as needed for pain.   She does report her COPD is worse.  She is currently taking Trelegy and Albuterol as prescribed.  She has not been cleared by pulmonology to have the surgery.  She has PFT's pending.  Review of Systems     Past Medical History:  Diagnosis Date   Abnormal EKG    HX OF PREVIOUS EKGS SHOWING POSSIBLE MI--BUT PT HAS NEVER HAD ANY HEART PROBLEMS--HAD BACK SURGERY AUG 2012 AT [facility] - NO HEART PROBLEMS   Chronic cough    PT IS A SMOKER   DDD (degenerative disc disease), lumbar    PAIN IN LOWER BACK AND DOWN RT HIP AND LEG--NUMBNESS, THROBBING, BURNING   Hyperthyroidism 02/20/2015    Current Outpatient Medications  Medication Sig Dispense Refill   albuterol (VENTOLIN HFA) 108 (90 Base) MCG/ACT inhaler Inhale 1-2 puffs into the lungs every 6 (six) hours as needed for wheezing or shortness of breath. 18 g 2   Fluticasone-Umeclidin-Vilant (TRELEGY ELLIPTA) 100-62.5-25 MCG/INH AEPB Inhale 1 puff into the lungs daily. 1  each 5   Fluticasone-Umeclidin-Vilant (TRELEGY ELLIPTA) 100-62.5-25 MCG/INH AEPB Inhale 1 puff into the lungs daily. 60 each 0   pregabalin (LYRICA) 50 MG capsule Take 50 mg by mouth 2 (two) times daily.     venlafaxine XR (EFFEXOR-XR) 37.5 MG 24 hr capsule Take 1 capsule (37.5 mg total) by mouth daily with breakfast. (Patient not taking: Reported on 07/19/2021) 90 capsule 0   No current facility-administered medications for this visit.    No Known Allergies  Family History  Problem Relation Age of Onset   Heart disease Mother    Heart disease Father    Stroke Maternal Grandmother    Diabetes Maternal Grandmother     Social History   Socioeconomic History   Marital status: Single    Spouse name: Not on file   Number of children: Not on file   Years of education: Not on file   Highest education level: Not on file  Occupational History   Not on file  Tobacco Use   Smoking status: Former    Packs/day: 2.00    Years: 30.00    Pack years: 60.00    Types: Cigarettes    Quit date: 04/02/2021    Years since quitting: 0.2   Smokeless tobacco: Never   Tobacco comments:    Pt stated quit and started back and quit 1 week ago//07/19/21  Vaping Use   Vaping Use: Never used  Substance and Sexual Activity   Alcohol  use: Yes    Alcohol/week: 0.0 standard drinks    Comment: rare   Drug use: No   Sexual activity: Not Currently  Other Topics Concern   Not on file  Social History Narrative   Not on file   Social Determinants of Health   Financial Resource Strain: Not on file  Food Insecurity: Not on file  Transportation Needs: Not on file  Physical Activity: Not on file  Stress: Not on file  Social Connections: Not on file  Intimate Partner Violence: Not on file     Constitutional: Denies fever, malaise, fatigue, headache or abrupt weight changes.  Respiratory: Denies difficulty breathing, shortness of breath, cough or sputum production.   Cardiovascular: Denies chest pain,  chest tightness, palpitations or swelling in the hands or feet.  Musculoskeletal: Pt reports low back pain. Denies decrease in range of motion, difficulty with gait, muscle pain or joint swelling.  Neurological: Denies numbness, tingling weakness or problems with balance and coordination.    No other specific complaints in a complete review of systems (except as listed in HPI above).  Objective:   Physical Exam  BP (!) 146/60 (BP Location: Left Arm, Patient Position: Sitting, Cuff Size: Small)   Pulse 75   Temp 97.8 F (36.6 C) (Temporal)   Resp 18   Ht 5\' 5"  (1.651 m)   Wt 115 lb (52.2 kg)   SpO2 97%   BMI 19.14 kg/m   Wt Readings from Last 3 Encounters:  07/19/21 114 lb 9.6 oz (52 kg)  04/10/21 110 lb (49.9 kg)  07/20/20 110 lb (49.9 kg)    General: Appears her stated age, well developed, well nourished in NAD. Skin: Warm, dry and intact.  HEENT: Head: normal shape and size; Eyes: sclera white and EOMs intact;  Cardiovascular: Normal rate and rhythm. S1,S2 noted.  No murmur, rubs or gallops noted. No JVD or BLE edema. No carotid bruits noted. Pulmonary/Chest: Normal effort with diminished vesicular breath sounds. No respiratory distress. No wheezes, rales or ronchi noted.  Musculoskeletal: Normal flexion, rotation and lateral bending of the spine. Decreased extension secondary to pain. Pain with palpation over the lumbar spine. Strength 5/5 BLE. Able to stand on tip toes and heels but only for a very brief period of time. No difficulty with gait.  Neurological: Alert and oriented.  Psychiatric: Mood and affect normal. Behavior is normal. Judgment and thought content normal.    BMET    Component Value Date/Time   NA 138 07/20/2020 1541   K 4.3 07/20/2020 1541   CL 99 07/20/2020 1541   CO2 26 07/20/2020 1541   GLUCOSE 88 07/20/2020 1541   GLUCOSE 135 (H) 09/25/2019 1642   BUN 14 07/20/2020 1541   CREATININE 0.76 07/20/2020 1541   CALCIUM 9.9 07/20/2020 1541   GFRNONAA  81 07/20/2020 1541   GFRAA 93 07/20/2020 1541    Lipid Panel     Component Value Date/Time   CHOL 246 (H) 07/20/2020 1541   TRIG 155 (H) 07/20/2020 1541   HDL 46 07/20/2020 1541   CHOLHDL 5.3 (H) 07/20/2020 1541   LDLCALC 172 (H) 07/20/2020 1541    CBC    Component Value Date/Time   WBC 11.8 (H) 07/20/2020 1541   WBC 11.4 (H) 09/25/2019 1642   RBC 5.38 (H) 07/20/2020 1541   RBC 5.42 (H) 09/25/2019 1642   HGB 16.9 (H) 07/20/2020 1541   HCT 48.6 (H) 07/20/2020 1541   PLT 239 07/20/2020 1541   MCV 90  07/20/2020 1541   MCH 31.4 07/20/2020 1541   MCH 31.2 09/25/2019 1642   MCHC 34.8 07/20/2020 1541   MCHC 34.8 09/25/2019 1642   RDW 13.2 07/20/2020 1541   LYMPHSABS 4.0 05/15/2012 1525   MONOABS 0.8 05/15/2012 1525   EOSABS 0.3 05/15/2012 1525   BASOSABS 0.1 05/15/2012 1525    Hgb A1C Lab Results  Component Value Date   HGBA1C 5.9 (H) 10/01/2016            Assessment & Plan:   Perioperative Screening Exam:  Indication for ECG: Preop screening Interpretation of ECG: PAC's, normal rate. Left axis deviation Comparison of ECG: 08/2019, no changes Will check CBC, CMET, Lipid and A1C Insurance would not pay for appt, PT/INR She will continue to follow with neurosurgery  RTC in 3 month for your annual exam Webb Silversmith, NP This visit occurred during the SARS-CoV-2 public health emergency.  Safety protocols were in place, including screening questions prior to the visit, additional usage of staff PPE, and extensive cleaning of exam room while observing appropriate contact time as indicated for disinfecting solutions.

## 2021-07-20 NOTE — Assessment & Plan Note (Signed)
Encourage regular stretching and core strengthening Continue Ibuprofen and Flexeril

## 2021-07-20 NOTE — Assessment & Plan Note (Signed)
A1c today.  

## 2021-07-20 NOTE — Assessment & Plan Note (Signed)
PFTs pending Continue Trelegy and Albuterol Congratulated her on smoking cessation

## 2021-07-24 ENCOUNTER — Encounter: Payer: Self-pay | Admitting: *Deleted

## 2021-07-24 NOTE — Progress Notes (Signed)
ATC x2, unable to leave a message d/t patient using call blocker.  Unable to reach letter sent per policy.  Nothing further needed.

## 2021-07-30 ENCOUNTER — Telehealth: Payer: Self-pay

## 2021-07-30 NOTE — Telephone Encounter (Signed)
Spoke with pt regarding lung cancer screening. Pt would like for me to call her back in 2-3 weeks to get scheduled. Will call back. Will close this message and refer to referral notes.

## 2021-07-30 NOTE — Telephone Encounter (Signed)
Patient was not happy me calling her to remind her about her upcoming covid test. Patient is aware and nothing further is needed.

## 2021-07-31 ENCOUNTER — Other Ambulatory Visit: Payer: Self-pay | Admitting: Internal Medicine

## 2021-07-31 ENCOUNTER — Other Ambulatory Visit: Payer: Self-pay

## 2021-07-31 ENCOUNTER — Other Ambulatory Visit
Admission: RE | Admit: 2021-07-31 | Discharge: 2021-07-31 | Disposition: A | Discharge status: Home / Self Care | Payer: Medicare Other | Source: Ambulatory Visit | Attending: Adult Health | Admitting: Adult Health

## 2021-07-31 DIAGNOSIS — E782 Mixed hyperlipidemia: Secondary | ICD-10-CM | POA: Diagnosis not present

## 2021-07-31 DIAGNOSIS — Z20822 Contact with and (suspected) exposure to covid-19: Secondary | ICD-10-CM | POA: Diagnosis not present

## 2021-07-31 DIAGNOSIS — Z01818 Encounter for other preprocedural examination: Secondary | ICD-10-CM

## 2021-07-31 DIAGNOSIS — Z01812 Encounter for preprocedural laboratory examination: Secondary | ICD-10-CM | POA: Insufficient documentation

## 2021-07-31 DIAGNOSIS — R7303 Prediabetes: Secondary | ICD-10-CM | POA: Diagnosis not present

## 2021-08-01 ENCOUNTER — Ambulatory Visit: Payer: Medicare Other | Attending: Adult Health

## 2021-08-01 DIAGNOSIS — F17210 Nicotine dependence, cigarettes, uncomplicated: Secondary | ICD-10-CM | POA: Diagnosis not present

## 2021-08-01 DIAGNOSIS — J449 Chronic obstructive pulmonary disease, unspecified: Secondary | ICD-10-CM | POA: Diagnosis not present

## 2021-08-01 LAB — CBC
Hematocrit: 47.9 % — ABNORMAL HIGH (ref 34.0–46.6)
Hemoglobin: 16.1 g/dL — ABNORMAL HIGH (ref 11.1–15.9)
MCH: 31.6 pg (ref 26.6–33.0)
MCHC: 33.6 g/dL (ref 31.5–35.7)
MCV: 94 fL (ref 79–97)
Platelets: 215 10*3/uL (ref 150–450)
RBC: 5.09 x10E6/uL (ref 3.77–5.28)
RDW: 12.2 % (ref 11.7–15.4)
WBC: 9.8 10*3/uL (ref 3.4–10.8)

## 2021-08-01 LAB — COMPREHENSIVE METABOLIC PANEL
ALT: 16 IU/L (ref 0–32)
AST: 18 IU/L (ref 0–40)
Albumin/Globulin Ratio: 1.5 (ref 1.2–2.2)
Albumin: 4.2 g/dL (ref 3.8–4.8)
Alkaline Phosphatase: 87 IU/L (ref 44–121)
BUN/Creatinine Ratio: 11 — ABNORMAL LOW (ref 12–28)
BUN: 10 mg/dL (ref 8–27)
Bilirubin Total: 0.4 mg/dL (ref 0.0–1.2)
CO2: 26 mmol/L (ref 20–29)
Calcium: 9.5 mg/dL (ref 8.7–10.3)
Chloride: 102 mmol/L (ref 96–106)
Creatinine, Ser: 0.87 mg/dL (ref 0.57–1.00)
Globulin, Total: 2.8 g/dL (ref 1.5–4.5)
Glucose: 97 mg/dL (ref 70–99)
Potassium: 4.5 mmol/L (ref 3.5–5.2)
Sodium: 140 mmol/L (ref 134–144)
Total Protein: 7 g/dL (ref 6.0–8.5)
eGFR: 72 mL/min/{1.73_m2} (ref >59)

## 2021-08-01 LAB — SARS CORONAVIRUS 2 (TAT 6-24 HRS): SARS Coronavirus 2: NEGATIVE

## 2021-08-01 LAB — LIPID PANEL
Chol/HDL Ratio: 5.3 ratio — ABNORMAL HIGH (ref 0.0–4.4)
Cholesterol, Total: 233 mg/dL — ABNORMAL HIGH (ref 100–199)
HDL: 44 mg/dL (ref >39)
LDL Chol Calc (NIH): 162 mg/dL — ABNORMAL HIGH (ref 0–99)
Triglycerides: 148 mg/dL (ref 0–149)
VLDL Cholesterol Cal: 27 mg/dL (ref 5–40)

## 2021-08-01 LAB — HEMOGLOBIN A1C
Est. average glucose Bld gHb Est-mCnc: 126 mg/dL
Hgb A1c MFr Bld: 6 % — ABNORMAL HIGH (ref 4.8–5.6)

## 2021-08-01 MED ORDER — ALBUTEROL SULFATE (2.5 MG/3ML) 0.083% IN NEBU
2.5000 mg | INHALATION_SOLUTION | Freq: Once | RESPIRATORY_TRACT | Status: AC
  Administered 2021-08-01: 2.5 mg via RESPIRATORY_TRACT
  Filled 2021-08-01: qty 3

## 2021-08-02 LAB — PULMONARY FUNCTION TEST ARMC ONLY
DL/VA % pred: 44 %
DL/VA: 1.81 ml/min/mmHg/L
DLCO unc % pred: 45 %
DLCO unc: 9.11 ml/min/mmHg
FEF 25-75 Post: 0.35 L/sec
FEF 25-75 Pre: 0.36 L/sec
FEF2575-%Change-Post: -3 %
FEF2575-%Pred-Post: 17 %
FEF2575-%Pred-Pre: 17 %
FEV1-%Change-Post: -1 %
FEV1-%Pred-Post: 36 %
FEV1-%Pred-Pre: 37 %
FEV1-Post: 0.87 L
FEV1-Pre: 0.89 L
FEV1FVC-%Change-Post: -2 %
FEV1FVC-%Pred-Pre: 58 %
FEV6-%Change-Post: 0 %
FEV6-%Pred-Post: 61 %
FEV6-%Pred-Pre: 61 %
FEV6-Post: 1.86 L
FEV6-Pre: 1.86 L
FEV6FVC-%Change-Post: -1 %
FEV6FVC-%Pred-Post: 96 %
FEV6FVC-%Pred-Pre: 98 %
FVC-%Change-Post: 1 %
FVC-%Pred-Post: 63 %
FVC-%Pred-Pre: 62 %
FVC-Post: 2 L
FVC-Pre: 1.98 L
Post FEV1/FVC ratio: 44 %
Post FEV6/FVC ratio: 93 %
Pre FEV1/FVC ratio: 45 %
Pre FEV6/FVC Ratio: 94 %
RV % pred: 178 %
RV: 3.97 L
TLC % pred: 118 %
TLC: 6.17 L

## 2021-08-02 NOTE — Progress Notes (Signed)
I agree with the assessment.

## 2021-08-03 ENCOUNTER — Telehealth: Payer: Self-pay | Admitting: Adult Health

## 2021-08-03 NOTE — Telephone Encounter (Signed)
Received below results from Rexene Edison, NP  Will forward recommendations  to Honor Loh, NP.   PFTs shows  a drop in lung function , FEV1 at 37% , ratio 45    As discussed at ov , smoking cessation is key    Preop pulmonary/respiratory exam Preop pulmonary risk assessment.  Patient has underlying severe COPD.   Advise patient she would be a high risk candidate for surgery with potential pulmonary complications postop.  Not excluded. We went over potential surgical and postop risk. Will need to discuss with Surgeon regarding benefit and risk .    Major Pulmonary risks identified in the multifactorial risk analysis are but not limited to a) pneumonia; b) recurrent intubation risk; c) prolonged or recurrent acute respiratory failure needing mechanical ventilation; d) prolonged hospitalization; e) DVT/Pulmonary embolism; f) Acute Pulmonary edema   Recommend 1. Short duration of surgery as much as possible and avoid paralytic if possible  2. Recovery in step down or ICU with Pulmonary consultation if indicated  3. DVT prophylaxis if indicated  4. Aggressive pulmonary toilet with o2, bronchodilatation, and incentive spirometry and early ambulation

## 2021-08-06 ENCOUNTER — Telehealth: Payer: Self-pay | Admitting: Adult Health

## 2021-08-07 NOTE — Telephone Encounter (Signed)
Patient had PFT's 07/19/21. Results forwarded to NP Pearline Cables at provider request.   Patient aware this message is old. Aware of results per TP. Voiced understanding.   Nothing further needed at this time.

## 2021-08-09 ENCOUNTER — Telehealth: Payer: Self-pay | Admitting: Pulmonary Disease

## 2021-08-09 DIAGNOSIS — L858 Other specified epidermal thickening: Secondary | ICD-10-CM | POA: Diagnosis not present

## 2021-08-09 DIAGNOSIS — D485 Neoplasm of uncertain behavior of skin: Secondary | ICD-10-CM | POA: Diagnosis not present

## 2021-08-09 DIAGNOSIS — D225 Melanocytic nevi of trunk: Secondary | ICD-10-CM | POA: Diagnosis not present

## 2021-08-09 DIAGNOSIS — L728 Other follicular cysts of the skin and subcutaneous tissue: Secondary | ICD-10-CM | POA: Diagnosis not present

## 2021-08-09 DIAGNOSIS — D2262 Melanocytic nevi of left upper limb, including shoulder: Secondary | ICD-10-CM | POA: Diagnosis not present

## 2021-08-09 DIAGNOSIS — L821 Other seborrheic keratosis: Secondary | ICD-10-CM | POA: Diagnosis not present

## 2021-08-09 DIAGNOSIS — D2261 Melanocytic nevi of right upper limb, including shoulder: Secondary | ICD-10-CM | POA: Diagnosis not present

## 2021-08-09 NOTE — Telephone Encounter (Addendum)
Pamela Haley results have been faxed to Dr. Rennis Harding via epic.  Patient is aware and voiced her understanding.  Nothing further needed.

## 2021-08-13 ENCOUNTER — Telehealth: Payer: Self-pay | Admitting: Adult Health

## 2021-08-13 NOTE — Telephone Encounter (Signed)
Spoke to patient, who is requesting update on surgical clearance form. Form has been located and faxed to [facility] office for Tammy Parrett to address. Routing to TP as an [place].

## 2021-08-13 NOTE — Telephone Encounter (Signed)
Please refer to 08/09/2021 phone note.   Lm for patient.

## 2021-08-13 NOTE — Telephone Encounter (Signed)
Linwood Dibbles, [place]  08/03/2021  9:24 AM EDT     Patient is aware of results and voiced her understanding.  Recommendations have been forward to pre op team. Nothing further needed.    Linwood Dibbles, [place]  08/02/2021  4:24 PM EDT     Lm for patient.    Melvenia Needles, NP  08/02/2021 11:03 AM EDT     PFTs shows  a drop in lung function , FEV1 at 37% , ratio 45    As discussed at ov , smoking cessation is key    Preop pulmonary/respiratory exam Preop pulmonary risk assessment.  Patient has underlying severe COPD.   Advise patient she would be a high risk candidate for surgery with potential pulmonary complications postop.  Not excluded. We went over potential surgical and postop risk. Will need to discuss with Surgeon regarding benefit and risk .    Major Pulmonary risks identified in the multifactorial risk analysis are but not limited to a) pneumonia; b) recurrent intubation risk; c) prolonged or recurrent acute respiratory failure needing mechanical ventilation; d) prolonged hospitalization; e) DVT/Pulmonary embolism; f) Acute Pulmonary edema   Recommend 1. Short duration of surgery as much as possible and avoid paralytic if possible  2. Recovery in step down or ICU with Pulmonary consultation if indicated  3. DVT prophylaxis if indicated  4. Aggressive pulmonary toilet with o2, bronchodilatation, and incentive spirometry and early ambulation   Margie, you sent this on 08/03/21 , what are they needing in addition. We do not sign preop clearances.

## 2021-08-16 NOTE — Telephone Encounter (Signed)
Will hold message to discuss with TP on 08/17/2021 when in [place] office.

## 2021-08-17 NOTE — Telephone Encounter (Signed)
OV note has been [facility]ed to [facility].   Patient is aware and voiced her understanding.  Nothing further needed.

## 2021-09-14 DIAGNOSIS — M48062 Spinal stenosis, lumbar region with neurogenic claudication: Secondary | ICD-10-CM | POA: Diagnosis not present

## 2021-09-14 DIAGNOSIS — M4716 Other spondylosis with myelopathy, lumbar region: Secondary | ICD-10-CM | POA: Diagnosis not present

## 2021-09-14 DIAGNOSIS — M961 Postlaminectomy syndrome, not elsewhere classified: Secondary | ICD-10-CM | POA: Diagnosis not present

## 2021-09-14 DIAGNOSIS — D166 Benign neoplasm of vertebral column: Secondary | ICD-10-CM | POA: Diagnosis not present

## 2021-09-24 DIAGNOSIS — Z01812 Encounter for preprocedural laboratory examination: Secondary | ICD-10-CM | POA: Diagnosis not present

## 2021-09-24 DIAGNOSIS — M4316 Spondylolisthesis, lumbar region: Secondary | ICD-10-CM | POA: Diagnosis not present

## 2021-09-24 DIAGNOSIS — M4716 Other spondylosis with myelopathy, lumbar region: Secondary | ICD-10-CM | POA: Diagnosis not present

## 2021-09-24 DIAGNOSIS — D166 Benign neoplasm of vertebral column: Secondary | ICD-10-CM | POA: Diagnosis not present

## 2021-09-24 DIAGNOSIS — M5136 Other intervertebral disc degeneration, lumbar region: Secondary | ICD-10-CM | POA: Diagnosis not present

## 2021-09-24 DIAGNOSIS — E46 Unspecified protein-calorie malnutrition: Secondary | ICD-10-CM | POA: Diagnosis not present

## 2021-09-24 DIAGNOSIS — Z01810 Encounter for preprocedural cardiovascular examination: Secondary | ICD-10-CM | POA: Diagnosis not present

## 2021-09-24 DIAGNOSIS — I2109 ST elevation (STEMI) myocardial infarction involving other coronary artery of anterior wall: Secondary | ICD-10-CM | POA: Diagnosis not present

## 2021-09-24 DIAGNOSIS — M961 Postlaminectomy syndrome, not elsewhere classified: Secondary | ICD-10-CM | POA: Diagnosis not present

## 2021-09-25 DIAGNOSIS — I700 Atherosclerosis of aorta: Secondary | ICD-10-CM | POA: Diagnosis not present

## 2021-09-25 DIAGNOSIS — D166 Benign neoplasm of vertebral column: Secondary | ICD-10-CM | POA: Diagnosis not present

## 2021-09-25 DIAGNOSIS — Z981 Arthrodesis status: Secondary | ICD-10-CM | POA: Diagnosis not present

## 2021-09-25 DIAGNOSIS — J449 Chronic obstructive pulmonary disease, unspecified: Secondary | ICD-10-CM | POA: Diagnosis not present

## 2021-09-25 DIAGNOSIS — M5116 Intervertebral disc disorders with radiculopathy, lumbar region: Secondary | ICD-10-CM | POA: Diagnosis not present

## 2021-09-25 DIAGNOSIS — M961 Postlaminectomy syndrome, not elsewhere classified: Secondary | ICD-10-CM | POA: Diagnosis not present

## 2021-09-25 DIAGNOSIS — M7138 Other bursal cyst, other site: Secondary | ICD-10-CM | POA: Diagnosis not present

## 2021-09-25 DIAGNOSIS — M5126 Other intervertebral disc displacement, lumbar region: Secondary | ICD-10-CM | POA: Diagnosis not present

## 2021-09-25 DIAGNOSIS — M5416 Radiculopathy, lumbar region: Secondary | ICD-10-CM | POA: Diagnosis not present

## 2021-09-25 DIAGNOSIS — M48062 Spinal stenosis, lumbar region with neurogenic claudication: Secondary | ICD-10-CM | POA: Diagnosis not present

## 2021-09-25 DIAGNOSIS — M4326 Fusion of spine, lumbar region: Secondary | ICD-10-CM | POA: Diagnosis not present

## 2021-09-25 DIAGNOSIS — F17210 Nicotine dependence, cigarettes, uncomplicated: Secondary | ICD-10-CM | POA: Diagnosis not present

## 2021-09-25 DIAGNOSIS — M4726 Other spondylosis with radiculopathy, lumbar region: Secondary | ICD-10-CM | POA: Diagnosis not present

## 2021-09-26 DIAGNOSIS — F17210 Nicotine dependence, cigarettes, uncomplicated: Secondary | ICD-10-CM | POA: Diagnosis not present

## 2021-09-26 DIAGNOSIS — M48062 Spinal stenosis, lumbar region with neurogenic claudication: Secondary | ICD-10-CM | POA: Diagnosis not present

## 2021-09-26 DIAGNOSIS — M5126 Other intervertebral disc displacement, lumbar region: Secondary | ICD-10-CM | POA: Diagnosis not present

## 2021-09-26 DIAGNOSIS — Z981 Arthrodesis status: Secondary | ICD-10-CM | POA: Diagnosis not present

## 2021-09-26 DIAGNOSIS — M4326 Fusion of spine, lumbar region: Secondary | ICD-10-CM | POA: Diagnosis not present

## 2021-09-26 DIAGNOSIS — J449 Chronic obstructive pulmonary disease, unspecified: Secondary | ICD-10-CM | POA: Diagnosis not present

## 2021-09-26 DIAGNOSIS — M4726 Other spondylosis with radiculopathy, lumbar region: Secondary | ICD-10-CM | POA: Diagnosis not present

## 2021-09-26 DIAGNOSIS — M7138 Other bursal cyst, other site: Secondary | ICD-10-CM | POA: Diagnosis not present

## 2021-11-09 DIAGNOSIS — Z981 Arthrodesis status: Secondary | ICD-10-CM | POA: Diagnosis not present

## 2021-11-09 DIAGNOSIS — M4326 Fusion of spine, lumbar region: Secondary | ICD-10-CM | POA: Diagnosis not present

## 2021-11-12 ENCOUNTER — Telehealth: Payer: Self-pay | Admitting: Pulmonary Disease

## 2021-11-12 NOTE — Telephone Encounter (Signed)
Called and spoke to [place], patient has plenty of refills left on Trelegy. Nothing further needed.

## 2021-12-11 ENCOUNTER — Ambulatory Visit: Payer: Medicare Other | Admitting: Pulmonary Disease

## 2021-12-26 DIAGNOSIS — M4326 Fusion of spine, lumbar region: Secondary | ICD-10-CM | POA: Diagnosis not present

## 2021-12-26 DIAGNOSIS — M545 Low back pain, unspecified: Secondary | ICD-10-CM | POA: Diagnosis not present

## 2022-01-23 ENCOUNTER — Ambulatory Visit: Payer: Medicare Other | Admitting: Pulmonary Disease

## 2022-03-05 DIAGNOSIS — M545 Low back pain, unspecified: Secondary | ICD-10-CM | POA: Diagnosis not present

## 2022-03-05 DIAGNOSIS — M4326 Fusion of spine, lumbar region: Secondary | ICD-10-CM | POA: Diagnosis not present

## 2022-03-05 DIAGNOSIS — Z79899 Other long term (current) drug therapy: Secondary | ICD-10-CM | POA: Diagnosis not present

## 2022-03-05 DIAGNOSIS — Z79891 Long term (current) use of opiate analgesic: Secondary | ICD-10-CM | POA: Diagnosis not present

## 2022-03-05 DIAGNOSIS — G894 Chronic pain syndrome: Secondary | ICD-10-CM | POA: Diagnosis not present

## 2022-03-27 ENCOUNTER — Encounter: Payer: Self-pay | Admitting: Pulmonary Disease

## 2022-03-27 ENCOUNTER — Ambulatory Visit (INDEPENDENT_AMBULATORY_CARE_PROVIDER_SITE_OTHER): Payer: Medicare Other | Admitting: Pulmonary Disease

## 2022-03-27 VITALS — BP 142/82 | HR 100 | Temp 97.5°F | Ht 65.0 in | Wt 109.6 lb

## 2022-03-27 DIAGNOSIS — J449 Chronic obstructive pulmonary disease, unspecified: Secondary | ICD-10-CM | POA: Diagnosis not present

## 2022-03-27 DIAGNOSIS — Z87891 Personal history of nicotine dependence: Secondary | ICD-10-CM

## 2022-03-27 MED ORDER — BREZTRI AEROSPHERE 160-9-4.8 MCG/ACT IN AERO: 2.0000 | INHALATION_SPRAY | Freq: Two times a day (BID) | RESPIRATORY_TRACT | 0 refills | Status: DC

## 2022-03-27 NOTE — Patient Instructions (Signed)
Do not hesitate to use the nicotine patches as this can curtail your cravings for nicotine.  I am giving you a trial of a new inhaler called Breztri 2 puffs twice a day.  Make sure you rinse your mouth well after you use it.  Let us know how you do with it so we can call the prescription into your pharmacy.  This will replace your Trelegy altogether.  DO NOT USE THE TRELEGY WHILE ON THE BREZTRI.  Call the lung cancer screening program so they can set you up for the lung cancer screening.  We will see you in follow-up in 2 to 3 months time call sooner should any new problems arise.

## 2022-03-27 NOTE — Progress Notes (Signed)
Subjective:    Patient ID: Pamela Haley, female    DOB: June 12, 1951, 71 y.o.   MRN: [id number] Patient Care Team: Pamela Haley as PCP - General (Internal Medicine)  Chief Complaint  Patient presents with   Follow-up    Occ sob with exertion and non prod cough.    HPI Pamela Haley is a 71 year old recent former smoker (03/18/2022, 30 PY) who presents for follow-up on the issue of COPD which is severe in nature.  I last saw the patient in April 2021, prior to that she had been following with Pamela Haley.  In the interim she has been evaluated by Pamela Haley last visit was September 2022.  PFTs performed October 2022 revealed severe stage III COPD with function worsening from prior 2018 PFTs.  The patient is currently on Trelegy Ellipta but does not like the medication due to its powdered form.  It makes her cough more.  Cough is for the most part non productive but feels "congested" usually in the mornings or after using Trelegy.  He recently noted that "allergies" are making her dry cough worse.  No hemoptysis.  She has not had any fevers, chills or sweats.  Weight has been stable.  No anorexia.  She does not endorse shortness of breath above baseline.  No orthopnea or paroxysmal nocturnal dyspnea.  No lower extremity edema.  No calf tenderness.   She has been referred to the Lung Cancer Screening Program twice previously, she had not been able to make her first appointment yet due to needing back surgery.  She is to call the program and make an appointment.  DATA 07/29/17 PFTs: Mod-severe obstruction (FEV1 1.19 L, 49%), hyperinflation and gas trapping  - TLC 6.80 L (135% pred), RV 3.78 L (192%), DLCO 50% predicted 08/01/2021 PFTs: FEV1 0.89 L or 37% predicted, FVC 1.98 L or 62% predicted, FEV1/FVC 45%.  No bronchodilator response.  There is hyperinflation and air trapping on lung volumes.  No significant bronchodilator response.  Diffusion capacity severely impaired.  Consistent  with severe COPD with significant decrease in lung function when compared to prior   Review of Systems A 10 point review of systems was performed and it is as noted above otherwise negative.  Patient Active Problem List   Diagnosis Date Noted   Prediabetes 07/20/2021   HLD (hyperlipidemia) 07/20/2021   Protein calorie malnutrition ([place]) 04/10/2021   Tobacco abuse 04/10/2021   COPD (chronic obstructive pulmonary disease) ([place]) 01/25/2016   DDD (degenerative disc disease), lumbar 02/20/2015   Hyperthyroidism 02/20/2015   Social History   Tobacco Use   Smoking status: Former    Packs/day: 2.00    Years: 30.00    Pack years: 60.00    Types: Cigarettes    Quit date: 03/18/2022    Years since quitting: 0.0   Smokeless tobacco: Never  Substance Use Topics   Alcohol use: Yes    Alcohol/week: 0.0 standard drinks    Comment: rare   No Known Allergies  Current Meds  Medication Sig   albuterol (VENTOLIN HFA) 108 (90 Base) MCG/ACT inhaler Inhale 1-2 puffs into the lungs every 6 (six) hours as needed for wheezing or shortness of breath.   Fluticasone-Umeclidin-Vilant (TRELEGY ELLIPTA) 100-62.5-25 MCG/INH AEPB Inhale 1 puff into the lungs daily.   Immunization History  Administered Date(s) Administered   Fluad Quad(high Dose 65+) 07/26/2019   Influenza, High Dose Seasonal PF 08/25/2018, 07/10/2021   Influenza,inj,Quad PF,6+ Mos 09/11/2016   Moderna  Covid-19 Vaccine Bivalent Booster 20yr & up 07/10/2021   Moderna SARS-COV2 Booster Vaccination 08/21/2020   Moderna Sars-Covid-2 Vaccination 12/02/2019, 12/29/2019, 08/11/2020   Pneumococcal Conjugate-13 08/25/2018       Objective:   Physical Exam BP (!) 142/82 (BP Location: Left Arm, Cuff Size: Normal)   Pulse 100   Temp (!) 97.5 F (36.4 C) (Temporal)   Ht '5\' 5"'$  (1.651 m)   Wt 109 lb 9.6 oz (49.7 kg)   SpO2 94%   BMI 18.24 kg/m  GENERAL: Slender, well-developed woman, no acute distress. HEAD: Normocephalic, atraumatic.   EYES: Pupils equal, round, reactive to light.  No scleral icterus.  Conjunctiva mildly injected, MOUTH: Oral mucosa moist.  No thrush. NECK: Supple. No thyromegaly. Trachea midline. No JVD.  No adenopathy. PULMONARY: Good air entry bilaterally.  Coarse otherwise, no adventitious sounds. CARDIOVASCULAR: S1 and S2. Regular rate and rhythm.  No rubs, murmurs or gallops heard. ABDOMEN: Benign. MUSCULOSKELETAL: No joint deformity, no clubbing, no edema.  NEUROLOGIC: Neuro normal. SKIN: Intact,warm,dry. PSYCH: Mood and behavior normal.  Most recent chest x-ray 19 July 2021, COPD changes:     Assessment & Plan:     ICD-10-CM   1. Stage 3 severe COPD by GOLD classification ([place]  J44.9    Switch Trelegy to Breztri 2 puffs twice a day Samples provided for the patient Continue as needed albuterol    2. Former smoker  Z87.891    Congratulated on quitting smoking Discussed use of nicotine patches to prevent cravings Patient to call lung cancer screening program for enrollment     Meds ordered this encounter  Medications   Budeson-Glycopyrrol-Formoterol (BREZTRI AEROSPHERE) 160-9-4.8 MCG/ACT AERO    Sig: Inhale 2 puffs into the lungs in the morning and at bedtime.    Dispense:  10.7 g    Refill:  0    Order Specific Question:   Lot Number?    Answer:   [id number]    Order Specific Question:   Expiration Date?    Answer:   07/28/2024    Order Specific Question:   Manufacturer?    Answer:   [employer] [[id number]]    Order Specific Question:   Quantity    Answer:   2   The patient in follow-up in 2 to 3 months time she is to contact [facility] to that time should any new difficulties arise.  CRenold Don MD Advanced Bronchoscopy PCCM [facility]    *This note was dictated using voice recognition software/Dragon.  Despite best efforts to proofread, errors can occur which can change the meaning. Any transcriptional errors that result from this process are  unintentional and may not be fully corrected at the time of dictation.

## 2022-04-09 ENCOUNTER — Telehealth: Payer: Self-pay | Admitting: Pulmonary Disease

## 2022-04-09 MED ORDER — BREZTRI AEROSPHERE 160-9-4.8 MCG/ACT IN AERO: 2.0000 | INHALATION_SPRAY | Freq: Two times a day (BID) | RESPIRATORY_TRACT | 11 refills | Status: DC

## 2022-04-09 NOTE — Telephone Encounter (Signed)
Breztri has been sent to preferred pharmacy.  Patient is aware and voiced her understanding.  Nothing further needed.    

## 2022-04-15 DIAGNOSIS — M5451 Vertebrogenic low back pain: Secondary | ICD-10-CM | POA: Diagnosis not present

## 2022-04-16 ENCOUNTER — Telehealth: Payer: Self-pay | Admitting: Pulmonary Disease

## 2022-04-16 MED ORDER — BREZTRI AEROSPHERE 160-9-4.8 MCG/ACT IN AERO: 2.0000 | INHALATION_SPRAY | Freq: Two times a day (BID) | RESPIRATORY_TRACT | 0 refills | Status: DC

## 2022-04-16 NOTE — Telephone Encounter (Signed)
Patient is aware of below message/recommendations and voiced her understanding.  Two samples and application has been placed up front for pickup. Nothing further needed.

## 2022-04-16 NOTE — Telephone Encounter (Signed)
We can give her a few more samples of the [facility] and have her apply for medication assistance.  If not the other possibility would be to switch her to nebulized medications.  She was having some issues with cough with the Trelegy.

## 2022-04-16 NOTE — Telephone Encounter (Signed)
Called and spoke to patient.  She stated that Pamela Haley worked well for her but it is not affordable. She would like to switch back to trelegy.  Dr. Patsey Berthold, please advise. Thanks

## 2022-04-22 DIAGNOSIS — M5451 Vertebrogenic low back pain: Secondary | ICD-10-CM | POA: Diagnosis not present

## 2022-04-25 DIAGNOSIS — M5451 Vertebrogenic low back pain: Secondary | ICD-10-CM | POA: Diagnosis not present

## 2022-04-26 ENCOUNTER — Telehealth: Payer: Self-pay | Admitting: Pulmonary Disease

## 2022-04-26 NOTE — Telephone Encounter (Signed)
Application has been located and placed in Dr. Domingo Dimes folder for signature.  Patient is aware and voiced her understanding.

## 2022-05-03 DIAGNOSIS — M5451 Vertebrogenic low back pain: Secondary | ICD-10-CM | POA: Diagnosis not present

## 2022-05-03 NOTE — Telephone Encounter (Signed)
Still awaiting signature.  

## 2022-05-06 DIAGNOSIS — M5451 Vertebrogenic low back pain: Secondary | ICD-10-CM | POA: Diagnosis not present

## 2022-05-08 DIAGNOSIS — M7918 Myalgia, other site: Secondary | ICD-10-CM | POA: Diagnosis not present

## 2022-05-08 DIAGNOSIS — M4326 Fusion of spine, lumbar region: Secondary | ICD-10-CM | POA: Diagnosis not present

## 2022-05-08 DIAGNOSIS — M545 Low back pain, unspecified: Secondary | ICD-10-CM | POA: Diagnosis not present

## 2022-05-09 DIAGNOSIS — M5451 Vertebrogenic low back pain: Secondary | ICD-10-CM | POA: Diagnosis not present

## 2022-05-10 NOTE — Telephone Encounter (Signed)
Application has been faxed to [employer].  Received successful fax confirmation. Patient is aware and voiced her understanding.  Nothing further needed.

## 2022-05-15 DIAGNOSIS — M5451 Vertebrogenic low back pain: Secondary | ICD-10-CM | POA: Diagnosis not present

## 2022-05-16 IMAGING — CR DG CHEST 2V
2 series · 2 of 2 positions shown · non-contrast
Comparison: 09/25/2019

CLINICAL DATA: Chronic COPD, chronic cough, smoker

EXAM:
CHEST - 2 VIEW

[chest pa]
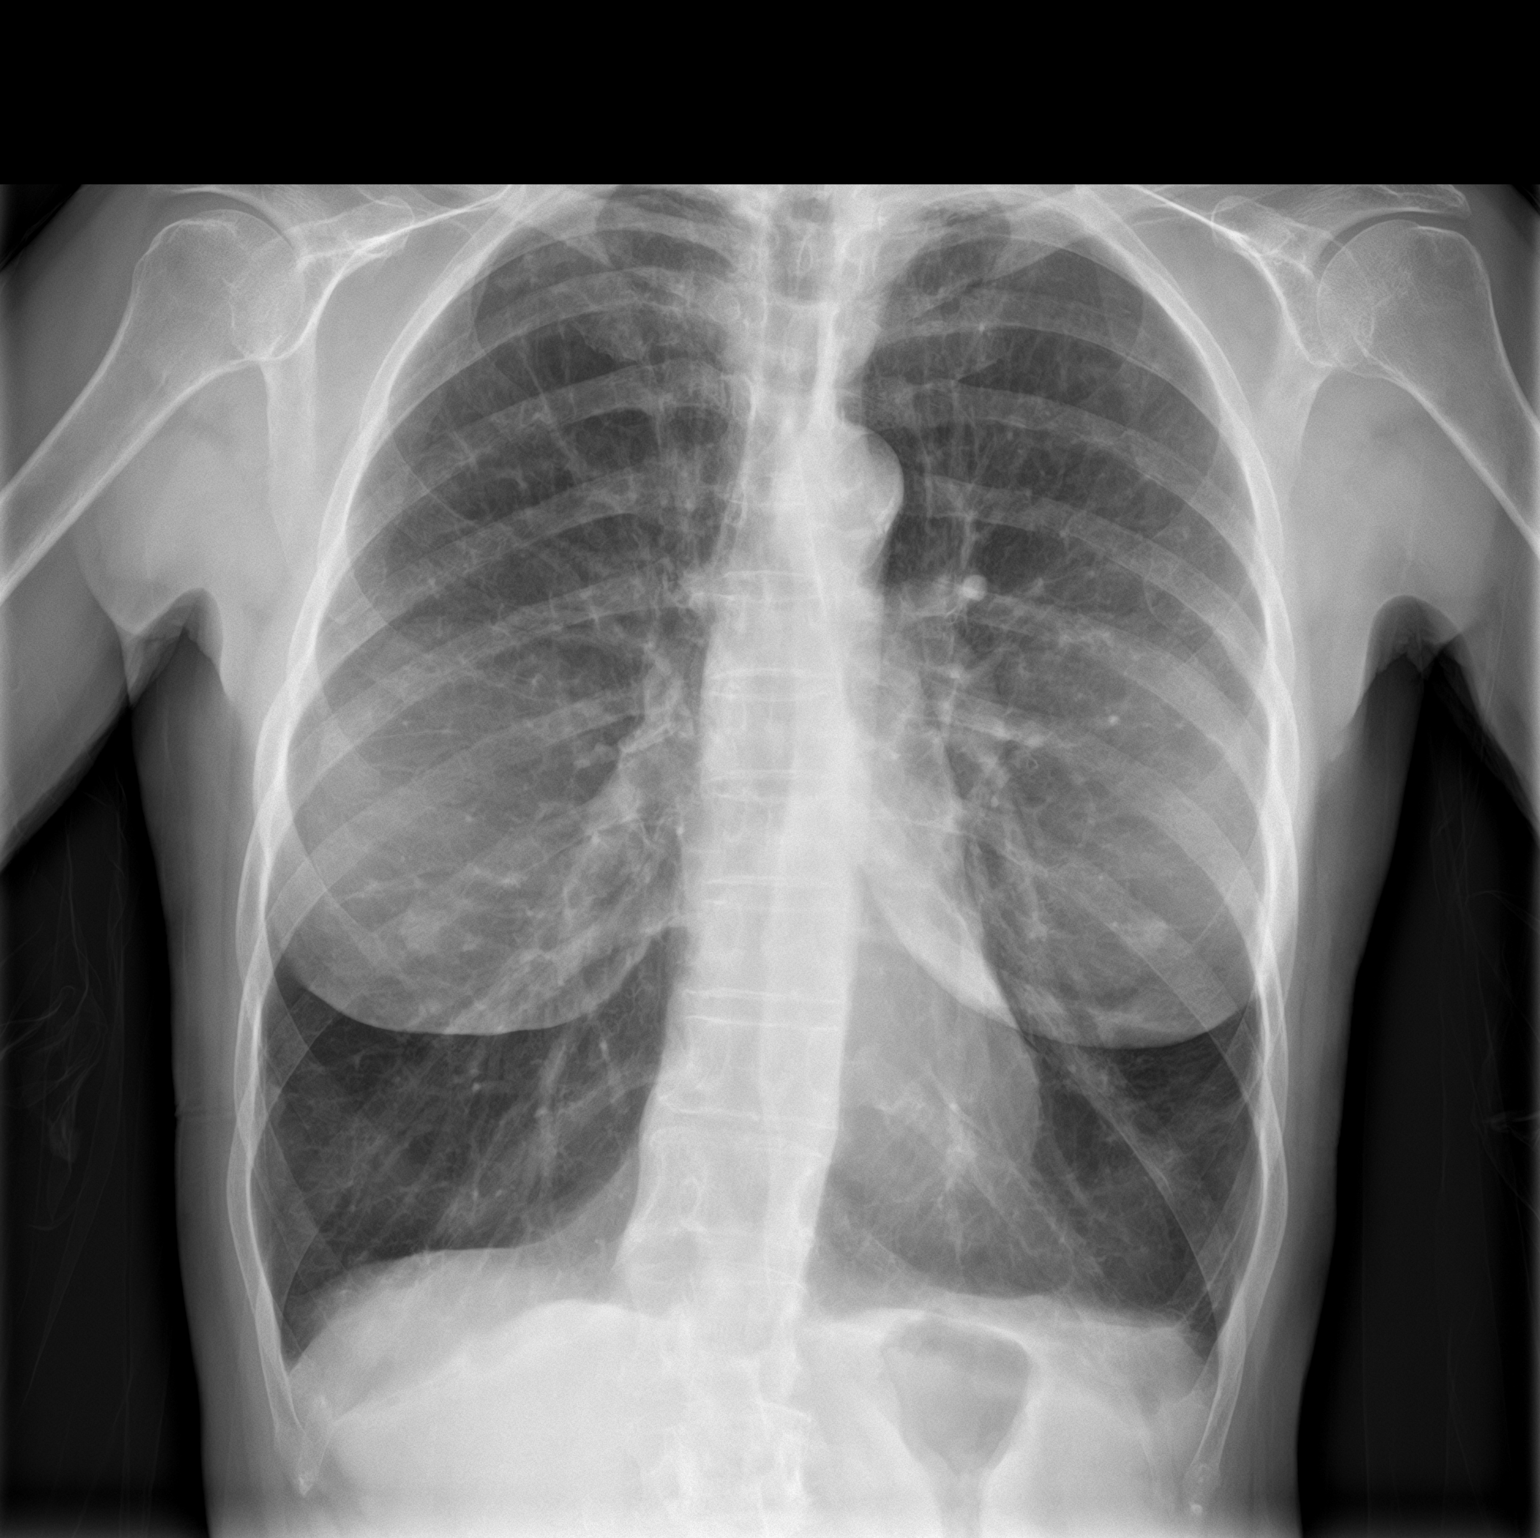

[chest lat]
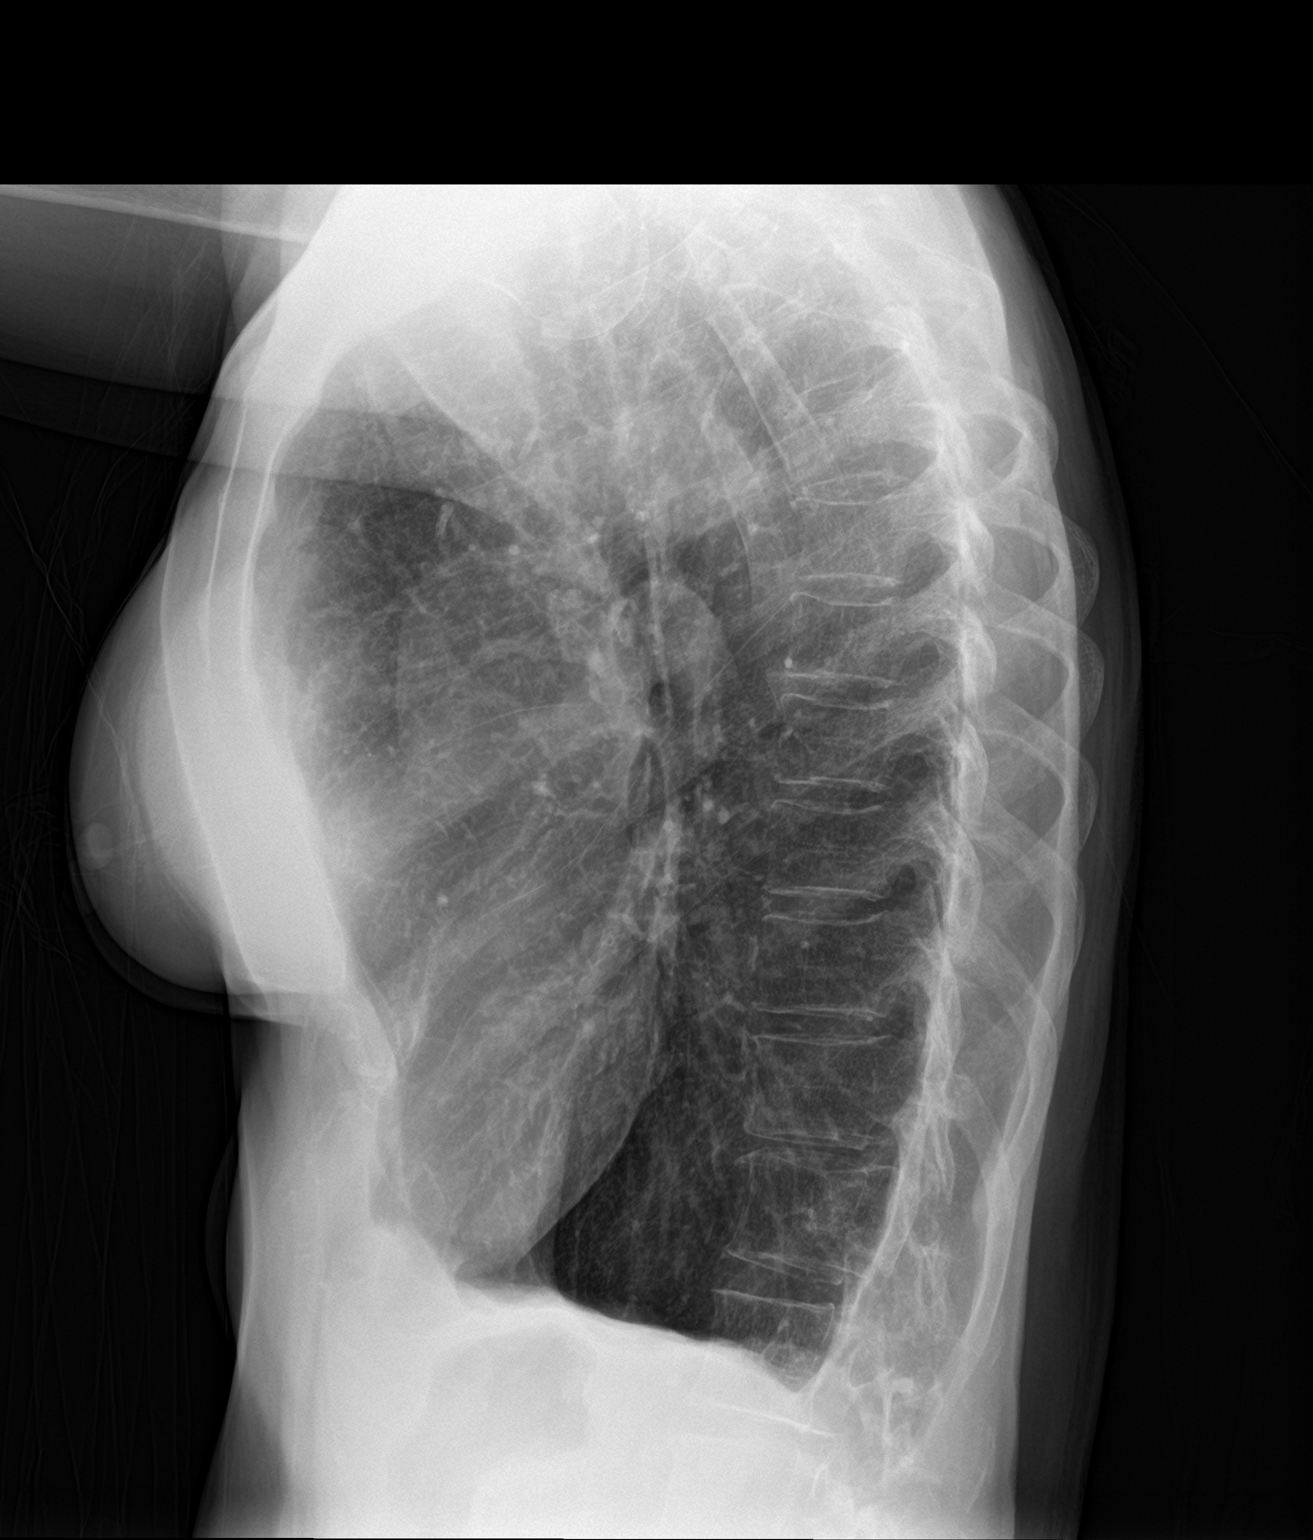

[2 of 2 positions shown; findings below may reference images not displayed]

FINDINGS: Normal heart size, mediastinal contours, and pulmonary vascularity.

Atherosclerotic calcification aorta.

Emphysematous and minimal bronchitic changes consistent with COPD.

No pulmonary infiltrate, pleural effusion, or pneumothorax.

Minimal LEFT basilar scarring.

BILATERAL breast prostheses and nipple shadows unchanged.

Diffuse osseous demineralization.
IMPRESSION: COPD changes without acute abnormality.

Aortic Atherosclerosis (OB1KQ-LBP.P) and Emphysema (OB1KQ-HJN.X).

## 2022-05-21 DIAGNOSIS — M5451 Vertebrogenic low back pain: Secondary | ICD-10-CM | POA: Diagnosis not present

## 2022-05-30 DIAGNOSIS — Z981 Arthrodesis status: Secondary | ICD-10-CM | POA: Diagnosis not present

## 2022-05-30 DIAGNOSIS — M545 Low back pain, unspecified: Secondary | ICD-10-CM | POA: Diagnosis not present

## 2022-05-30 DIAGNOSIS — M791 Myalgia, unspecified site: Secondary | ICD-10-CM | POA: Diagnosis not present

## 2022-05-30 DIAGNOSIS — M4326 Fusion of spine, lumbar region: Secondary | ICD-10-CM | POA: Diagnosis not present

## 2022-05-30 DIAGNOSIS — M5451 Vertebrogenic low back pain: Secondary | ICD-10-CM | POA: Diagnosis not present

## 2022-06-06 DIAGNOSIS — M5451 Vertebrogenic low back pain: Secondary | ICD-10-CM | POA: Diagnosis not present

## 2022-06-11 ENCOUNTER — Ambulatory Visit: Payer: Medicare Other | Admitting: Pulmonary Disease

## 2022-06-13 DIAGNOSIS — M5451 Vertebrogenic low back pain: Secondary | ICD-10-CM | POA: Diagnosis not present

## 2022-06-25 DIAGNOSIS — M5451 Vertebrogenic low back pain: Secondary | ICD-10-CM | POA: Diagnosis not present

## 2022-07-05 DIAGNOSIS — M5451 Vertebrogenic low back pain: Secondary | ICD-10-CM | POA: Diagnosis not present

## 2022-07-11 DIAGNOSIS — M5451 Vertebrogenic low back pain: Secondary | ICD-10-CM | POA: Diagnosis not present

## 2022-07-15 DIAGNOSIS — M5451 Vertebrogenic low back pain: Secondary | ICD-10-CM | POA: Diagnosis not present

## 2022-07-16 DIAGNOSIS — Z981 Arthrodesis status: Secondary | ICD-10-CM | POA: Diagnosis not present

## 2022-07-16 DIAGNOSIS — M545 Low back pain, unspecified: Secondary | ICD-10-CM | POA: Diagnosis not present

## 2022-07-16 DIAGNOSIS — M4326 Fusion of spine, lumbar region: Secondary | ICD-10-CM | POA: Diagnosis not present

## 2022-07-16 DIAGNOSIS — M791 Myalgia, unspecified site: Secondary | ICD-10-CM | POA: Diagnosis not present

## 2022-07-18 DIAGNOSIS — M5451 Vertebrogenic low back pain: Secondary | ICD-10-CM | POA: Diagnosis not present

## 2022-08-02 DIAGNOSIS — Z23 Encounter for immunization: Secondary | ICD-10-CM | POA: Diagnosis not present

## 2022-08-12 ENCOUNTER — Ambulatory Visit: Payer: Medicare Other | Admitting: Pulmonary Disease

## 2022-08-12 DIAGNOSIS — L720 Epidermal cyst: Secondary | ICD-10-CM | POA: Diagnosis not present

## 2022-08-12 DIAGNOSIS — L298 Other pruritus: Secondary | ICD-10-CM | POA: Diagnosis not present

## 2022-08-12 DIAGNOSIS — L728 Other follicular cysts of the skin and subcutaneous tissue: Secondary | ICD-10-CM | POA: Diagnosis not present

## 2022-08-12 DIAGNOSIS — D2262 Melanocytic nevi of left upper limb, including shoulder: Secondary | ICD-10-CM | POA: Diagnosis not present

## 2022-08-12 DIAGNOSIS — L821 Other seborrheic keratosis: Secondary | ICD-10-CM | POA: Diagnosis not present

## 2022-08-12 DIAGNOSIS — D2261 Melanocytic nevi of right upper limb, including shoulder: Secondary | ICD-10-CM | POA: Diagnosis not present

## 2022-08-12 DIAGNOSIS — Z85828 Personal history of other malignant neoplasm of skin: Secondary | ICD-10-CM | POA: Diagnosis not present

## 2022-08-12 DIAGNOSIS — L570 Actinic keratosis: Secondary | ICD-10-CM | POA: Diagnosis not present

## 2022-08-15 ENCOUNTER — Other Ambulatory Visit: Payer: Self-pay | Admitting: Orthopaedic Surgery

## 2022-08-15 DIAGNOSIS — M47816 Spondylosis without myelopathy or radiculopathy, lumbar region: Secondary | ICD-10-CM

## 2022-08-15 DIAGNOSIS — M48062 Spinal stenosis, lumbar region with neurogenic claudication: Secondary | ICD-10-CM

## 2022-09-09 ENCOUNTER — Ambulatory Visit
Admission: RE | Admit: 2022-09-09 | Discharge: 2022-09-09 | Disposition: A | Discharge status: Home / Self Care | Payer: Medicare Other | Source: Ambulatory Visit | Attending: Orthopaedic Surgery | Admitting: Orthopaedic Surgery

## 2022-09-09 DIAGNOSIS — M5126 Other intervertebral disc displacement, lumbar region: Secondary | ICD-10-CM | POA: Diagnosis not present

## 2022-09-09 DIAGNOSIS — Z981 Arthrodesis status: Secondary | ICD-10-CM | POA: Diagnosis not present

## 2022-09-09 DIAGNOSIS — M5416 Radiculopathy, lumbar region: Secondary | ICD-10-CM | POA: Diagnosis not present

## 2022-09-09 DIAGNOSIS — M48062 Spinal stenosis, lumbar region with neurogenic claudication: Secondary | ICD-10-CM

## 2022-09-09 DIAGNOSIS — M47816 Spondylosis without myelopathy or radiculopathy, lumbar region: Secondary | ICD-10-CM

## 2022-09-09 MED ORDER — MEPERIDINE HCL 50 MG/ML IJ SOLN: 50.0000 mg | Freq: Once | INTRAMUSCULAR | Status: DC | PRN

## 2022-09-09 MED ORDER — DIAZEPAM 5 MG PO TABS
5.0000 mg | ORAL_TABLET | Freq: Once | ORAL | Status: AC
  Administered 2022-09-09: 5 mg via ORAL

## 2022-09-09 MED ORDER — IOPAMIDOL (ISOVUE-M 200) INJECTION 41%
18.0000 mL | Freq: Once | INTRAMUSCULAR | Status: AC
  Administered 2022-09-09: 18 mL via INTRATHECAL

## 2022-09-09 MED ORDER — ONDANSETRON HCL 4 MG/2ML IJ SOLN: 4.0000 mg | Freq: Once | INTRAMUSCULAR | Status: DC | PRN

## 2022-09-09 NOTE — Discharge Instructions (Signed)

## 2022-09-13 ENCOUNTER — Ambulatory Visit (INDEPENDENT_AMBULATORY_CARE_PROVIDER_SITE_OTHER): Payer: Medicare Other | Admitting: Pulmonary Disease

## 2022-09-13 ENCOUNTER — Encounter: Payer: Self-pay | Admitting: Pulmonary Disease

## 2022-09-13 ENCOUNTER — Ambulatory Visit: Payer: Medicare Other | Admitting: Pulmonary Disease

## 2022-09-13 VITALS — BP 120/82 | HR 110 | Temp 98.2°F | Ht 65.0 in | Wt 99.6 lb

## 2022-09-13 DIAGNOSIS — J440 Chronic obstructive pulmonary disease with acute lower respiratory infection: Secondary | ICD-10-CM

## 2022-09-13 DIAGNOSIS — J449 Chronic obstructive pulmonary disease, unspecified: Secondary | ICD-10-CM | POA: Diagnosis not present

## 2022-09-13 DIAGNOSIS — Z720 Tobacco use: Secondary | ICD-10-CM

## 2022-09-13 DIAGNOSIS — M5136 Other intervertebral disc degeneration, lumbar region: Secondary | ICD-10-CM | POA: Diagnosis not present

## 2022-09-13 DIAGNOSIS — J209 Acute bronchitis, unspecified: Secondary | ICD-10-CM | POA: Diagnosis not present

## 2022-09-13 MED ORDER — AZITHROMYCIN 250 MG PO TABS: ORAL_TABLET | ORAL | 0 refills | Status: AC

## 2022-09-13 NOTE — Patient Instructions (Signed)
Continue using Breztri 2 puffs twice a day.  Continue albuterol as needed.  We are scheduling breathing tests that can be done prior to your follow-up visit.  Sent in a prescription for a Z-Pak to your pharmacy.  See in follow-up in 3 months time call sooner should any new problems arise.

## 2022-09-16 DIAGNOSIS — M4326 Fusion of spine, lumbar region: Secondary | ICD-10-CM | POA: Diagnosis not present

## 2022-09-16 DIAGNOSIS — M960 Pseudarthrosis after fusion or arthrodesis: Secondary | ICD-10-CM | POA: Diagnosis not present

## 2022-09-18 DIAGNOSIS — M4326 Fusion of spine, lumbar region: Secondary | ICD-10-CM | POA: Diagnosis not present

## 2022-09-25 ENCOUNTER — Encounter: Payer: Self-pay | Admitting: Pulmonary Disease

## 2022-09-25 NOTE — Progress Notes (Signed)
Subjective:    Patient ID: Pamela Haley, female    DOB: 09-07-1951, 71 y.o.   MRN: [id number] Patient Care Team: Jearld Fenton, NP as PCP - General (Internal Medicine) Tyler Pita, MD as Consulting Physician (Pulmonary Disease)  HPI Pamela Haley is a 71 year old intermittent smoker with a 60-pack-year history of smoking and a history as noted below, ascending for follow-up on the issue of COPD which is severe in nature.  I last saw the patient on 27 Mar 2022, at that time she was switched from Trelegy Ellipta to [place] 2 puffs twice a day.  He appeared to be well compensated during that visit. PFTs performed October 2022 revealed severe stage III COPD with function worsening from prior 2018 PFTs.  Patient notes dyspnea on exertion at baseline.  She notes that Pamela Haley has been helping her more than the Trelegy did.  Has not needed albuterol since on the [place].  He recently noted that "allergies" are making her cough worse.  She has had some change in sputum color from clear to pale green.  No hemoptysis.  She has not had any fevers, chills or sweats.  Weight has been stable.  No anorexia.  She does not endorse shortness of breath above baseline.  No orthopnea or paroxysmal nocturnal dyspnea.  No wheezing.  No lower extremity edema.  No calf tenderness.   She has been referred to the Lung Cancer Screening Program twice previously, she had not been able to make her first appointment yet due to needing back surgery in November last year.  She is to call the program and make an appointment at her convenience.  Patient states that she is to have revision of back surgery.   She has been for the most part abstinent of cigarettes but has occasions where she "sneaks" a cigarette or 2.   DATA 07/29/17 PFTs: Mod-severe obstruction (FEV1 1.19 L, 49%), hyperinflation and gas trapping  - TLC 6.80 L (135% pred), RV 3.78 L (192%), DLCO 50% predicted 07/19/2021 chest x-ray PA and lateral: Significant  hyperinflation and changes consistent with COPD/emphysema 08/01/2021 PFTs: FEV1 0.89 L or 37% predicted, FVC 1.98 L or 62% predicted, FEV1/FVC 45%.  No bronchodilator response.  There is hyperinflation and air trapping on lung volumes.  No significant bronchodilator response.  Diffusion capacity severely impaired.  Consistent with severe COPD with significant decrease in lung function when compared to prior   Review of Systems A 10 point review of systems was performed and it is as noted above otherwise negative.  Patient Active Problem List   Diagnosis Date Noted   Prediabetes 07/20/2021   HLD (hyperlipidemia) 07/20/2021   Protein calorie malnutrition ([place]) 04/10/2021   Tobacco abuse 04/10/2021   COPD (chronic obstructive pulmonary disease) ([place]) 01/25/2016   DDD (degenerative disc disease), lumbar 02/20/2015   Hyperthyroidism 02/20/2015   Social History   Tobacco Use   Smoking status: Former    Packs/day: 2.00    Years: 30.00    Total pack years: 60.00    Types: Cigarettes    Quit date: 03/18/2022    Years since quitting: 0.5   Smokeless tobacco: Never   Tobacco comments:    Has been smoking off and on. Right now she is not smoking. 09/13/2022  Substance Use Topics   Alcohol use: Yes    Alcohol/week: 0.0 standard drinks of alcohol    Comment: rare       Objective:   Physical Exam BP 120/82 (BP Location: Left  Arm, Cuff Size: Normal)   Pulse (!) 110   Temp 98.2 F (36.8 C)   Ht '5\' 5"'$  (1.651 m)   Wt 99 lb 9.6 oz (45.2 kg)   SpO2 95%   BMI 16.57 kg/m  GENERAL: Slender, well-developed woman, no acute distress. HEAD: Normocephalic, atraumatic.  EYES: Pupils equal, round, reactive to light.  No scleral icterus.  Conjunctiva mildly injected, MOUTH: Oral mucosa moist.  No thrush. NECK: Supple. No thyromegaly. Trachea midline. No JVD.  No adenopathy. PULMONARY: Good air entry bilaterally.  Coarse otherwise, no adventitious sounds. CARDIOVASCULAR: S1 and S2.  Slightly  tachycardic.  No rubs, murmurs or gallops heard. ABDOMEN: Benign. MUSCULOSKELETAL: No joint deformity, no clubbing, no edema.  NEUROLOGIC: Neuro normal. SKIN: Intact,warm,dry. PSYCH: Mood and behavior normal.      Assessment & Plan:     ICD-10-CM   1. Stage 3 severe COPD by GOLD classification ([place])  J44.9    Appears to be as compensated as she is to get Continue Breztri 2 puffs twice a day Continue as needed albuterol    2. Acute bronchitis with COPD ([place])  J44.0    J20.9    Mild tracheobronchitis  Azithromycin    3. DDD (degenerative disc disease), lumbar  M51.36    Appears to need revision of spine surgery Patient is moderate to high risk due to severity of COPD Risk is not prohibitive She is maximally compensated    4. Tobacco abuse, episodic  Z72.0    Occasional "sneak" of cigarette For the most part abstinent     Meds ordered this encounter  Medications   azithromycin (ZITHROMAX) 250 MG tablet    Sig: Take 2 tablets (500 mg) on  Day 1,  followed by 1 tablet (250 mg) once daily on Days 2 through 5.    Dispense:  6 each    Refill:  0   Will see the patient in follow-up in 3 months time.  We will assess her at that time for ability to obtain PFTs as her limiting factor is back pain.  Patient is to contact us prior to follow-up appointment should any new difficulties arise.  Pamela Don, MD Advanced Bronchoscopy PCCM [facility]    *This note was dictated using voice recognition software/Dragon.  Despite best efforts to proofread, errors can occur which can change the meaning. Any transcriptional errors that result from this process are unintentional and may not be fully corrected at the time of dictation.

## 2022-09-26 ENCOUNTER — Ambulatory Visit (INDEPENDENT_AMBULATORY_CARE_PROVIDER_SITE_OTHER): Payer: Medicare Other | Admitting: Internal Medicine

## 2022-09-26 ENCOUNTER — Encounter: Payer: Self-pay | Admitting: Internal Medicine

## 2022-09-26 VITALS — BP 122/78 | HR 126 | Temp 97.8°F | Wt 96.0 lb

## 2022-09-26 DIAGNOSIS — Z01818 Encounter for other preprocedural examination: Secondary | ICD-10-CM

## 2022-09-26 DIAGNOSIS — R7309 Other abnormal glucose: Secondary | ICD-10-CM

## 2022-09-26 DIAGNOSIS — R9431 Abnormal electrocardiogram [ECG] [EKG]: Secondary | ICD-10-CM | POA: Diagnosis not present

## 2022-09-26 DIAGNOSIS — M48062 Spinal stenosis, lumbar region with neurogenic claudication: Secondary | ICD-10-CM

## 2022-09-26 DIAGNOSIS — R791 Abnormal coagulation profile: Secondary | ICD-10-CM

## 2022-09-26 DIAGNOSIS — R000 Tachycardia, unspecified: Secondary | ICD-10-CM | POA: Diagnosis not present

## 2022-09-26 NOTE — Progress Notes (Signed)
Subjective:    Patient ID: Pamela Haley, female    DOB: 1950-11-18, 71 y.o.   MRN: [id number]  HPI  Patient presents to clinic today for surgical clearance.  She plans to have L2-L4 revision with instrumentation for treatment of spinal stenosis with neurogenic claudication.  This will be performed by Dr. Patrice Paradise, but has not been scheduled yet.  She reports chronic low back pain.  She reports the pain does not radiate into her lower extremities.  She denies numbness, tingling, weakness of her lower extremities.  She denies loss of bowel or bladder control.  She does report that she quit smoking cold [place] 1 week ago from 1 pack/day.  Review of Systems  Past Medical History:  Diagnosis Date   Abnormal EKG    HX OF PREVIOUS EKGS SHOWING POSSIBLE MI--BUT PT HAS NEVER HAD ANY HEART PROBLEMS--HAD BACK SURGERY AUG 2012 AT [facility] - NO HEART PROBLEMS   Chronic cough    PT IS A SMOKER   DDD (degenerative disc disease), lumbar    PAIN IN LOWER BACK AND DOWN RT HIP AND LEG--NUMBNESS, THROBBING, BURNING   Hyperthyroidism 02/20/2015    Current Outpatient Medications  Medication Sig Dispense Refill   albuterol (VENTOLIN HFA) 108 (90 Base) MCG/ACT inhaler Inhale 1-2 puffs into the lungs every 6 (six) hours as needed for wheezing or shortness of breath. 18 g 2   Budeson-Glycopyrrol-Formoterol (BREZTRI AEROSPHERE) 160-9-4.8 MCG/ACT AERO Inhale 2 puffs into the lungs in the morning and at bedtime. 10.7 g 11   Budeson-Glycopyrrol-Formoterol (BREZTRI AEROSPHERE) 160-9-4.8 MCG/ACT AERO Inhale 2 puffs into the lungs in the morning and at bedtime. 5.9 g 0   cyclobenzaprine (FLEXERIL) 10 MG tablet Take 10 mg by mouth 2 (two) times daily as needed.     ibuprofen (ADVIL) 800 MG tablet Take 800 mg by mouth 2 (two) times daily as needed.     oxyCODONE-acetaminophen (PERCOCET/ROXICET) 5-325 MG tablet Take 1 tablet by mouth at bedtime as needed.     No current facility-administered medications for this visit.     No Known Allergies  Family History  Problem Relation Age of Onset   Heart disease Mother    Heart disease Father    Stroke Maternal Grandmother    Diabetes Maternal Grandmother     Social History   Socioeconomic History   Marital status: Single    Spouse name: Not on file   Number of children: Not on file   Years of education: Not on file   Highest education level: Not on file  Occupational History   Not on file  Tobacco Use   Smoking status: Former    Packs/day: 2.00    Years: 30.00    Total pack years: 60.00    Types: Cigarettes    Quit date: 03/18/2022    Years since quitting: 0.5   Smokeless tobacco: Never   Tobacco comments:    Has been smoking off and on. Right now she is not smoking. 09/13/2022  Vaping Use   Vaping Use: Never used  Substance and Sexual Activity   Alcohol use: Yes    Alcohol/week: 0.0 standard drinks of alcohol    Comment: rare   Drug use: No   Sexual activity: Not Currently  Other Topics Concern   Not on file  Social History Narrative   Not on file   Social Determinants of Health   Financial Resource Strain: Not on file  Food Insecurity: Not on file  Transportation Needs: Not  on file  Physical Activity: Not on file  Stress: Not on file  Social Connections: Not on file  Intimate Partner Violence: Not on file     Constitutional: Denies fever, malaise, fatigue, headache or abrupt weight changes.  Respiratory: Denies difficulty breathing, shortness of breath, cough or sputum production.   Cardiovascular: Denies chest pain, chest tightness, palpitations or swelling in the hands or feet.  Gastrointestinal: Denies abdominal pain, bloating, constipation, diarrhea, blood in the stool or loss of bowel control.  GU: Denies urgency, frequency, pain with urination, burning sensation, blood in urine, odor , discharge or loss of bladder control. Musculoskeletal: Patient reports chronic low back pain.  Denies decrease in range of motion,  difficulty with gait, muscle pain or joint swelling.  Skin: Denies redness, rashes, lesions or ulcercations.  Neurological: Denies numbness, tingling, weakness or problems with balance and coordination.    No other specific complaints in a complete review of systems (except as listed in HPI above).     Objective:   Physical Exam  BP 122/78 (BP Location: Left Arm, Patient Position: Sitting, Cuff Size: Normal)   Pulse (!) 126   Temp 97.8 F (36.6 C) (Temporal)   Wt 96 lb (43.5 kg)   SpO2 97%   BMI 15.98 kg/m   Wt Readings from Last 3 Encounters:  09/13/22 99 lb 9.6 oz (45.2 kg)  03/27/22 109 lb 9.6 oz (49.7 kg)  07/20/21 115 lb (52.2 kg)    General: Appears her stated age, chronically ill-appearing, in NAD. Skin: Warm, dry and intact.  HEENT: Head: normal shape and size; Eyes: sclera white, no icterus, conjunctiva pink, PERRLA and EOMs intact;  Cardiovascular: Tachycardic with normal rhythm. S1,S2 noted.  No murmur, rubs or gallops noted. No JVD or BLE edema. No carotid bruits noted. Pulmonary/Chest: Normal effort and positive vesicular breath sounds. No respiratory distress. No wheezes, rales or ronchi noted.  Musculoskeletal: Normal flexion of the lumbar spine.  Decreased extension and rotation of the lumbar spine.  Pain with palpation over the lumbar spine.  Strength 5/5 BLE.  Able to stand on tiptoes and heels.  Gait steady without device. Neurological: Alert and oriented. Coordination normal.    BMET    Component Value Date/Time   NA 140 07/31/2021 1024   K 4.5 07/31/2021 1024   CL 102 07/31/2021 1024   CO2 26 07/31/2021 1024   GLUCOSE 97 07/31/2021 1024   GLUCOSE 135 (H) 09/25/2019 1642   BUN 10 07/31/2021 1024   CREATININE 0.87 07/31/2021 1024   CALCIUM 9.5 07/31/2021 1024   GFRNONAA 81 07/20/2020 1541   GFRAA 93 07/20/2020 1541    Lipid Panel     Component Value Date/Time   CHOL 233 (H) 07/31/2021 1024   TRIG 148 07/31/2021 1024   HDL 44 07/31/2021 1024    CHOLHDL 5.3 (H) 07/31/2021 1024   LDLCALC 162 (H) 07/31/2021 1024    CBC    Component Value Date/Time   WBC 9.8 07/31/2021 1024   WBC 11.4 (H) 09/25/2019 1642   RBC 5.09 07/31/2021 1024   RBC 5.42 (H) 09/25/2019 1642   HGB 16.1 (H) 07/31/2021 1024   HCT 47.9 (H) 07/31/2021 1024   PLT 215 07/31/2021 1024   MCV 94 07/31/2021 1024   MCH 31.6 07/31/2021 1024   MCH 31.2 09/25/2019 1642   MCHC 33.6 07/31/2021 1024   MCHC 34.8 09/25/2019 1642   RDW 12.2 07/31/2021 1024   LYMPHSABS 4.0 05/15/2012 1525   MONOABS 0.8 05/15/2012 1525  EOSABS 0.3 05/15/2012 1525   BASOSABS 0.1 05/15/2012 1525    Hgb A1C Lab Results  Component Value Date   HGBA1C 6.0 (H) 07/31/2021          Assessment & Plan:   Surgical Clearance for Spinal Stenosis with Neurogenic Claudication:  Indication of ECG: Preoperative clearance Interpretation of ECG: Sinus tachycardia with frequent PACs with left atrial abnormality and right ventricular hypertrophy Comparison of ECG: 06/2021, marked change We will need referral to cardiology for clearance We will check CBC, c-Met, APTT, PT/INR and A1C  RTC in 1 month for her annual exam Webb Silversmith, NP

## 2022-09-26 NOTE — Patient Instructions (Signed)
Spinal Stenosis  Spinal stenosis happens when the spinal canal gets smaller. The spinal canal is the space between the bones of your spine (vertebrae). As the canal gets smaller, the nerves that pass that part of the spine are pressed. This causes pain, weakness, or loss of feeling (numbness) in your arms or legs. Spinal stenosis can affect your neck, upper back, or lower back. What are the causes? This condition is caused by parts of bone that push into your spinal canal. This problem may be present at birth. If it occurs after birth, the cause may be: Breakdown of bones of your spine. This normally starts between 27 and 52 years of age. Injury to your spine. Any surgeries you have had to your spine. Tumors in your spine. A buildup of calcium in your spine. What increases the risk? You are more likely to develop this condition if: You are older than age 72. You were born with a problem in your spine, such as a curved spine (scoliosis). You have arthritis. This is disease of your joints. What are the signs or symptoms? Common symptoms of this condition include: Pain in your neck or back. The pain may be worse when you stand or walk. Problems with your legs or arms. A leg or arm may lose feeling, tingle, or turn hot or cold. This can happen in one arm or leg, or both. Pain that goes from your butt down to your lower leg (sciatica). This can happen in one or both legs. Falling a lot. Foot drop. This is when you have trouble lifting the front part of your foot and it drags on the ground when you walk. Severe symptoms of this condition include: Problems pooping (having a bowel movement) or peeing (urinating). Trouble having sex. Loss of feeling in your leg. Being unable to walk. Symptoms may come on slowly and get worse over time. Sometimes there are no symptoms. How is this treated? To treat pain and manage symptoms, you may be asked to: Practice sitting and standing up straight. This is  good posture. Do exercises. Lose weight, if needed. Take medicines or get shots. Wear a corset or a brace. This supports your back. In some cases, you may need to have surgery. Follow these instructions at home: Managing pain, stiffness, and swelling  Stand and sit up straight. If you have a brace or a corset, wear it as told by your doctor. Keep a healthy weight. Talk with your doctor if you need help losing weight. If told, put heat on the affected area. Do this as often as told by your doctor. Use the heat source that your doctor recommends, such as a moist heat pack or a heating pad. Place a towel between your skin and the heat source. Leave the heat on for 20-30 minutes. If your skin turns bright red, take off the heat right away to prevent burns. The risk of burns is higher if you cannot feel pain, heat, or cold. Activity Do exercises as told by your doctor. Do not do anything that causes pain. Ask your doctor what activities are safe for you. You may have to avoid lifting. Ask your doctor how much you can lift. Return to your normal activities when your doctor says that it is safe. General instructions Take over-the-counter and prescription medicines only as told by your doctor. Do not smoke or use any products that contain nicotine or tobacco. If you need help quitting, ask your doctor. Eat a healthy diet. Eat  a lot of: Fruits. Vegetables. Whole grains. Low-fat (lean) protein. Where to find more information [employer] of Arthritis and Musculoskeletal and Skin Diseases: www.niams.SouthExposed.es Contact a doctor if: Your symptoms do not get better. Your symptoms get worse. You have a fever. Get help right away if: You have new pain or very bad pain in your neck or upper back. You have very bad pain, and medicine does not help. You have a very bad headache. You are dizzy. You do not see well. You vomit or feel like you may vomit. You have these things in your back or  legs: New or worse loss of feeling. New or worse tingling. You cannot control when you poop or pee. Your arm or leg: Hurts or swells. Turns red. Feels warm. These symptoms may be an emergency. Get help right away. Call 911. Do not wait to see if the symptoms will go away. Do not drive yourself to the hospital. Summary Spinal stenosis happens when the space between the bones of the spine gets smaller. This condition may be present at birth. Spinal stenosis can cause pain in the neck, back, legs, or butt. Treatment for this condition focuses on lessening your pain and other symptoms. This information is not intended to replace advice given to you by your health care provider. Make sure you discuss any questions you have with your health care provider. Document Revised: 01/21/2022 Document Reviewed: 01/08/2022 Elsevier Patient Education  [place].

## 2022-10-03 ENCOUNTER — Telehealth: Payer: Self-pay | Admitting: Internal Medicine

## 2022-10-03 DIAGNOSIS — E0590 Thyrotoxicosis, unspecified without thyrotoxic crisis or storm: Secondary | ICD-10-CM

## 2022-10-03 NOTE — Telephone Encounter (Signed)
Patient would like PCP to add thyroid panel t3, t4 tsh. Patient will go to [facility] on Monday, 12/11. Patient would like a follow up call when completed.

## 2022-10-04 NOTE — Addendum Note (Signed)
Addended by: Jearld Fenton on: 10/04/2022 09:00 AM   Modules accepted: Orders

## 2022-10-04 NOTE — Telephone Encounter (Signed)
Pamela Haley states it is for her hyperthyroidism.     Thanks,   -Mickel Baas

## 2022-10-04 NOTE — Telephone Encounter (Signed)
Ordered

## 2022-10-04 NOTE — Telephone Encounter (Signed)
I need to know what the indication for these labs are she is requesting?

## 2022-10-09 ENCOUNTER — Telehealth: Payer: Self-pay

## 2022-10-09 DIAGNOSIS — E0590 Thyrotoxicosis, unspecified without thyrotoxic crisis or storm: Secondary | ICD-10-CM | POA: Diagnosis not present

## 2022-10-09 DIAGNOSIS — R7309 Other abnormal glucose: Secondary | ICD-10-CM | POA: Diagnosis not present

## 2022-10-09 DIAGNOSIS — R791 Abnormal coagulation profile: Secondary | ICD-10-CM | POA: Diagnosis not present

## 2022-10-09 DIAGNOSIS — Z01818 Encounter for other preprocedural examination: Secondary | ICD-10-CM | POA: Diagnosis not present

## 2022-10-09 DIAGNOSIS — M48062 Spinal stenosis, lumbar region with neurogenic claudication: Secondary | ICD-10-CM | POA: Diagnosis not present

## 2022-10-09 NOTE — Telephone Encounter (Signed)
PA request received via CMM through [employer] for Breztri Aerosphere 160-9-4.8MCG/ACT aerosol  PA has been submitted and is awaiting determination.  KeyCarolan Clines - PA Case ID: [id number]

## 2022-10-10 LAB — CBC
Hematocrit: 48.3 % — ABNORMAL HIGH (ref 34.0–46.6)
Hemoglobin: 16.2 g/dL — ABNORMAL HIGH (ref 11.1–15.9)
MCH: 31 pg (ref 26.6–33.0)
MCHC: 33.5 g/dL (ref 31.5–35.7)
MCV: 92 fL (ref 79–97)
Platelets: 381 10*3/uL (ref 150–450)
RBC: 5.23 x10E6/uL (ref 3.77–5.28)
RDW: 12 % (ref 11.7–15.4)
WBC: 9.7 10*3/uL (ref 3.4–10.8)

## 2022-10-10 LAB — COMPREHENSIVE METABOLIC PANEL
ALT: 11 IU/L (ref 0–32)
AST: 12 IU/L (ref 0–40)
Albumin/Globulin Ratio: 1.2 (ref 1.2–2.2)
Albumin: 4 g/dL (ref 3.8–4.8)
Alkaline Phosphatase: 84 IU/L (ref 44–121)
BUN/Creatinine Ratio: 21 (ref 12–28)
BUN: 16 mg/dL (ref 8–27)
Bilirubin Total: 0.3 mg/dL (ref 0.0–1.2)
CO2: 25 mmol/L (ref 20–29)
Calcium: 9.6 mg/dL (ref 8.7–10.3)
Chloride: 101 mmol/L (ref 96–106)
Creatinine, Ser: 0.76 mg/dL (ref 0.57–1.00)
Globulin, Total: 3.3 g/dL (ref 1.5–4.5)
Glucose: 111 mg/dL — ABNORMAL HIGH (ref 70–99)
Potassium: 4 mmol/L (ref 3.5–5.2)
Sodium: 145 mmol/L — ABNORMAL HIGH (ref 134–144)
Total Protein: 7.3 g/dL (ref 6.0–8.5)
eGFR: 84 mL/min/{1.73_m2} (ref >59)

## 2022-10-10 LAB — APTT: aPTT: 30 s (ref 24–33)

## 2022-10-10 LAB — TSH+FREE T4
Free T4: 1.67 ng/dL (ref 0.82–1.77)
TSH: 0.359 u[IU]/mL — ABNORMAL LOW (ref 0.450–4.500)

## 2022-10-10 LAB — PROTIME-INR
INR: 1 (ref 0.9–1.2)
Prothrombin Time: 10.9 s (ref 9.1–12.0)

## 2022-10-10 LAB — T3: T3, Total: 129 ng/dL (ref 71–180)

## 2022-10-10 LAB — HEMOGLOBIN A1C
Est. average glucose Bld gHb Est-mCnc: 123 mg/dL
Hgb A1c MFr Bld: 5.9 % — ABNORMAL HIGH (ref 4.8–5.6)

## 2022-10-17 ENCOUNTER — Ambulatory Visit: Payer: Self-pay

## 2022-10-17 NOTE — Telephone Encounter (Signed)
FYI...   I advised pt she would need to be seen for this.  She states she will give [place] a call next week and then disconnected the call.   Thanks,   -Mickel Baas

## 2022-10-17 NOTE — Telephone Encounter (Signed)
  Chief Complaint: vertigo Symptoms: vertigo when moving around Frequency: yesterday  Pertinent Negatives: Patient denies pain  Disposition: '[]'$ ED /'[]'$ Urgent Care (no appt availability in office) / '[]'$ Appointment(In office/virtual)/ '[]'$  [place] [facility]/ '[]'$ Home Care/ '[]'$ Refused Recommended Disposition /'[]'$ [place] [facility]/ '[x]'$  Follow-up with PCP Additional Notes: pt states she has had this before and needing something sent in. Pt has taken dramamine and it helps some but not a lot. Offered appt but pt unable to drive and doesn't have access to do VV. Advised would send message back to provider.   Reason for Disposition  [1] MODERATE dizziness (e.g., vertigo; feels very unsteady, interferes with normal activities) AND [2] has been evaluated by doctor (or NP/PA) for this  Answer Assessment - Initial Assessment Questions 2. VERTIGO: "Do you feel like either you or the room is spinning or tilting?"      yes 3. LIGHTHEADED: "Do you feel lightheaded?" (e.g., somewhat faint, woozy, weak upon standing)     yes 4. SEVERITY: "How bad is it?"  "Can you walk?"   - MILD: Feels slightly dizzy and unsteady, but is walking normally.   - MODERATE: Feels unsteady when walking, but not falling; interferes with normal activities (e.g., school, work).   - SEVERE: Unable to walk without falling, or requires assistance to walk without falling.     Mild to moderate  5. ONSET:  "When did the dizziness begin?"     yesterday 6. AGGRAVATING FACTORS: "Does anything make it worse?" (e.g., standing, change in head position)     movement 8. RECURRENT SYMPTOM: "Have you had dizziness before?" If Yes, ask: "When was the last time?" "What happened that time?"     Had before 9. OTHER SYMPTOMS: "Do you have any other symptoms?" (e.g., headache, weakness, numbness, vomiting, earache)     R ear pain  Protocols used: Dizziness - Vertigo-A-AH

## 2022-11-06 ENCOUNTER — Encounter: Payer: Self-pay | Admitting: Cardiovascular Disease

## 2022-11-06 ENCOUNTER — Ambulatory Visit: Payer: Medicare Other | Attending: Cardiology | Admitting: Cardiovascular Disease

## 2022-11-06 VITALS — BP 120/72 | HR 100 | Ht 64.0 in | Wt 99.0 lb

## 2022-11-06 DIAGNOSIS — R0602 Shortness of breath: Secondary | ICD-10-CM | POA: Diagnosis not present

## 2022-11-06 DIAGNOSIS — E785 Hyperlipidemia, unspecified: Secondary | ICD-10-CM | POA: Insufficient documentation

## 2022-11-06 DIAGNOSIS — I739 Peripheral vascular disease, unspecified: Secondary | ICD-10-CM | POA: Diagnosis not present

## 2022-11-06 DIAGNOSIS — Z01810 Encounter for preprocedural cardiovascular examination: Secondary | ICD-10-CM | POA: Diagnosis not present

## 2022-11-06 NOTE — Progress Notes (Signed)
Cardiology Office Note   Date:  11/06/2022   ID:  Pamela Haley, DOB 1951/05/16, MRN [id number]  PCP:  Jearld Fenton, NP  Cardiologist:   Kathlyn Sacramento, MD   Chief Complaint  Patient presents with   Other    ABN EKG/Tachycardia. Pt requesting cardiac clearance for back surgery.  Meds reviewed verbally with pt.      History of Present Illness: Pamela Haley is a 72 y.o. female who was referred by Webb Silversmith for preoperative cardiovascular evaluation for spine surgery.  She was found to have an abnormal EKG suggestive of prior infarct. She has known history of extensive tobacco use, COPD, hyperlipidemia and chronic back pain.  She smoked 1 pack/day for 40 years and quit smoking last week. Interestingly, she was told about an abnormal EKG when she was 72 years old.  Due to that, she was referred for cardiac testing at [facility] and was then told that the EKG was falsely positive for a prior infarct.  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  The patient is not aware of any prior cardiac events.  She suffers from low back pain at rest and with exertion.  In addition, her feet are always cold. She denies any chest pain but does have chronic exertional dyspnea which she attributes to COPD.  Her mobility is noted by chronic back pain.   Past Medical History:  Diagnosis Date   Abnormal EKG    HX OF PREVIOUS EKGS SHOWING POSSIBLE MI--BUT PT HAS NEVER HAD ANY HEART PROBLEMS--HAD BACK SURGERY AUG 2012 AT [facility] - NO HEART PROBLEMS   Chronic cough    PT IS A SMOKER   DDD (degenerative disc disease), lumbar    PAIN IN LOWER BACK AND DOWN RT HIP AND LEG--NUMBNESS, THROBBING, BURNING   Hyperthyroidism 02/20/2015    Past Surgical History:  Procedure Laterality Date   BACK SURGERY  05/2011   lumbar surgery at [facility]   [place] X 3       Current Outpatient Medications  Medication Sig Dispense Refill   albuterol (VENTOLIN HFA) 108 (90 Base) MCG/ACT inhaler Inhale 1-2 puffs into the lungs every 6 (six) hours as needed for wheezing or shortness of breath. 18 g 2   Budeson-Glycopyrrol-Formoterol (BREZTRI AEROSPHERE) 160-9-4.8 MCG/ACT AERO Inhale 2 puffs into the lungs in the morning and at bedtime. 10.7 g 11   cyclobenzaprine (FLEXERIL) 10 MG tablet Take 10 mg by mouth 2 (two) times daily as needed.     ibuprofen (ADVIL) 800 MG tablet Take 800 mg by mouth 2 (two) times daily as needed.     oxyCODONE-acetaminophen (PERCOCET/ROXICET) 5-325 MG tablet Take 1 tablet by mouth at bedtime as needed.     No current facility-administered medications for this visit.    Allergies:   Patient has no known allergies.    Social History:  The patient  reports that she quit smoking about 7 months ago. Her smoking use included cigarettes. She has a 60.00 pack-year smoking history. She has never used smokeless tobacco. She reports current alcohol use. She reports that she does not use drugs.   Family History:  The patient's family history includes Diabetes in her maternal grandmother; Heart attack in her father; Heart disease in her father and mother; Stroke in her maternal grandmother.  ROS:  Please see the history of present illness.   Otherwise, review of systems are positive for none.   All other systems are reviewed and negative.    PHYSICAL EXAM: VS:  BP 120/72 (BP Location: Right Arm, Patient Position: Sitting, Cuff Size: Normal)   Pulse 100   Ht '5\' 4"'$  (1.626 m)   Wt 99 lb (44.9 kg)   SpO2 96%   BMI 16.99 kg/m  , BMI Body mass index is 16.99 kg/m. GEN: Well nourished, well developed, in no acute distress  HEENT: normal  Neck: no JVD, carotid bruits, or masses Cardiac: RRR; no murmurs, rubs, or gallops,no edema  Respiratory:  clear to auscultation bilaterally, normal work of breathing GI: soft, nontender,  nondistended, + BS MS: no deformity or atrophy  Skin: warm and dry, no rash Neuro:  Strength and sensation are intact Psych: euthymic mood, full affect Vascular: Femoral pulses +1 on the right and nonpalpable on the left.  Dorsalis pedis is absent bilaterally.  Posterior tibial is barely palpable bilaterally.   EKG:  EKG is ordered today. 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   Recent Labs: 10/09/2022: ALT 11; BUN 16; Creatinine, Ser 0.76; Hemoglobin 16.2; Platelets 381; Potassium 4.0; Sodium 145; TSH 0.359    Lipid Panel    Component Value Date/Time   CHOL 233 (H) 07/31/2021 1024   TRIG 148 07/31/2021 1024   HDL 44 07/31/2021 1024   CHOLHDL 5.3 (H) 07/31/2021 1024   LDLCALC 162 (H) 07/31/2021 1024      Wt Readings from Last 3 Encounters:  11/06/22 99 lb (44.9 kg)  09/26/22 96 lb (43.5 kg)  09/13/22 99 lb 9.6 oz (45.2 kg)           No data to display            ASSESSMENT AND PLAN:  1.  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  However, she was told about this when she was 72 years old and had negative cardiac workup.  Not entirely sure if she has a delta wave causing a pseudo infarct pattern.  Clinically, she never had an infarct before.  I requested an echocardiogram for evaluation.  She lives by herself and up until recently, she has been reasonably active with workload more than 4 METS.  If echocardiogram is unremarkable, she can proceed with surgery at an overall moderate risk considering her lung disease.  2.  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  Her vascular exam is highly abnormal and suggestive of significant inflow disease.  I requested lower extremity arterial Doppler for evaluation.  I wonder if some of her back pain might be related to peripheral arterial  disease.  3.  Hyperlipidemia: Lipid profile in 2022 showed an LDL of 162.  I suspect that she will require treatment with a statin.    Disposition:   FU with me in 2 months  Signed,  Kathlyn Sacramento, MD  11/06/2022 4:52 PM    [facility]

## 2022-11-06 NOTE — Patient Instructions (Signed)
Medication Instructions:  No changes *If you need a refill on your cardiac medications before your next appointment, please call your pharmacy*   Lab Work: None ordered If you have labs (blood work) drawn today and your tests are completely normal, you will receive your results only by: [place] (if you have MyChart) OR A paper copy in the mail If you have any lab test that is abnormal or we need to change your treatment, we will call you to review the results.   Testing/Procedures: Your physician has requested that you have an echocardiogram. Echocardiography is a painless test that uses sound waves to create images of your heart. It provides your doctor with information about the size and shape of your heart and how well your heart's chambers and valves are working.   You may receive an ultrasound enhancing agent through an IV if needed to better visualize your heart during the echo. This procedure takes approximately one hour.  There are no restrictions for this procedure.  This will take place at [place] ([place]) [address], [place]  Your physician has requested that you have a lower extremity segmental duplex. There are no restrictions or special instructions. This will take place at [place] ([place], [place]   Follow-Up: At [facility], you and your health needs are our priority.  As part of our continuing mission to provide you with exceptional heart care, we have created designated Provider Care Teams.  These Care Teams include your primary Cardiologist (physician) and Advanced Practice Providers (APPs -  Physician Assistants and Nurse Practitioners) who all work together to provide you with the care you need, when you need it.  We recommend signing up for the patient portal called "MyChart".  Sign up information is provided on this After Visit Summary.  MyChart is used to connect with  patients for Virtual Visits (Telemedicine).  Patients are able to view lab/test results, encounter notes, upcoming appointments, etc.  Non-urgent messages can be sent to your provider as well.   To learn more about what you can do with MyChart, go to [email or web address].    Your next appointment:   2 month(s)  Provider:   You may see Dr. Fletcher Anon or one of the following Advanced Practice Providers on your designated Care Team:   Murray Hodgkins, NP Christell Faith, PA-C Cadence Kathlen Mody, PA-C Gerrie Nordmann, NP

## 2022-11-12 ENCOUNTER — Other Ambulatory Visit: Payer: Self-pay | Admitting: Cardiovascular Disease

## 2022-11-12 DIAGNOSIS — I739 Peripheral vascular disease, unspecified: Secondary | ICD-10-CM

## 2022-11-13 ENCOUNTER — Ambulatory Visit: Payer: Medicare Other | Attending: Cardiovascular Disease

## 2022-11-13 DIAGNOSIS — R0602 Shortness of breath: Secondary | ICD-10-CM | POA: Diagnosis not present

## 2022-11-13 LAB — ECHOCARDIOGRAM COMPLETE
Area-P 1/2: 2.53 cm2
MV VTI: 1.21 cm2
S' Lateral: 2.4 cm

## 2022-11-14 ENCOUNTER — Telehealth: Payer: Self-pay | Admitting: Pulmonary Disease

## 2022-11-14 NOTE — Telephone Encounter (Signed)
Prescription is in your folder for signature.

## 2022-11-14 NOTE — Telephone Encounter (Signed)
Done

## 2022-11-14 NOTE — Telephone Encounter (Signed)
I have faxed the script and notified the patient.   Nothing further needed.

## 2022-11-15 ENCOUNTER — Ambulatory Visit: Payer: Medicare Other | Admitting: Cardiology

## 2022-11-15 ENCOUNTER — Telehealth: Payer: Self-pay | Admitting: Internal Medicine

## 2022-11-15 NOTE — Telephone Encounter (Signed)
Pt is calling to see if Rollene Fare received a surgical clearance paperwork from Dr Delana Meyer? Can it be signed and returned.  Please advise CB- [phone]

## 2022-11-18 ENCOUNTER — Telehealth: Payer: Self-pay | Admitting: Internal Medicine

## 2022-11-18 ENCOUNTER — Telehealth: Payer: Self-pay | Admitting: Cardiovascular Disease

## 2022-11-18 NOTE — Telephone Encounter (Signed)
I already did that in my office note:  1.  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  However, she was told about this when she was 72 years old and had negative cardiac workup.  Not entirely sure if she has a delta wave causing a pseudo infarct pattern.  Clinically, she never had an infarct before.  I requested an echocardiogram for evaluation.  She lives by herself and up until recently, she has been reasonably active with workload more than 4 METS.  If echocardiogram is unremarkable, she can proceed with surgery at an overall moderate risk considering her lung disease.

## 2022-11-18 NOTE — Telephone Encounter (Signed)
Source  Pamela Haley (Patient)   Subject  Pamela Haley (Patient)   Topic  General - Other    Summary  ECG / surgical clearance  Communication  Reason for CRM: The patient has called to request completion of their surgical clearance form once their ECG results have been reviewed        The patient would like to speak with a member of clinical staff further if needed        The patient has stressed the urgency of their request        Please contact further if/when possible  This TE is a copy of a CRM that was sent today, Pt states that everything should have been sent over to [facility] and be complete for her to receive medical clearance/ Pls advise, very anxious. [phone]

## 2022-11-18 NOTE — Telephone Encounter (Signed)
I spoke with the patient. She advised that Dr. Fletcher Anon was to give an ok for her pending back surgery after her echocardiogram was completed. Per the patient, the surgeons office wanted the clearance to come from the PCP. The patient advised we do not need to send the surgeon anything directly.   The patient is aware that Marko Plume, NP has access to her echo report that was read as: Pamela Hampshire, MD 11/14/2022  7:54 AM EST     Inform patient that echo was fine.  Normal ejection fraction with no significant valvular abnormalities.    The patient is aware that I don't see an actual statement of clearance from Dr. Fletcher Anon, but will forward to him to clarify so we can send this statement of clearance to [facility], NP.  The patient voices understanding and is agreeable.

## 2022-11-18 NOTE — Telephone Encounter (Signed)
   Pre-operative Risk Assessment    Patient Name: Pamela Haley  DOB: 08-Nov-1950 MRN: [id number]      Request for Surgical Clearance    Procedure:   Revision L2-5 PSF with TLIF & instrumentation  Date of Surgery:  Clearance TBD                                 Surgeon:  Dr Starling Manns Surgeon's Group or Practice Name:  [facility] Phone number:  [phone] Fax number:  [phone]   Type of Clearance Requested:   - Medical    Type of Anesthesia:  General    Additional requests/questions:    Manfred Arch   11/18/2022, 4:22 PM

## 2022-11-18 NOTE — Telephone Encounter (Signed)
This was faxed on 10/24/2022 not clearing her stating that she needed cardiology clearance first.  The copy of the scanned document is under the media tab.

## 2022-11-18 NOTE — Telephone Encounter (Signed)
   Patient Name: Pamela Haley  DOB: January 23, 1951 MRN: [id number]  Primary Cardiologist: Kathlyn Sacramento, MD  Chart reviewed as part of pre-operative protocol coverage.  Patient was seen by Dr. Fletcher Anon on 11/06/2022.  Per Dr. Tyrell Antonio note from most recent [place] on 11/06/2022, "If echocardiogram is unremarkable, she can proceed with surgery at an overall moderate risk considering her lung disease."  Recent echocardiogram on 11/13/2022 was overall normal with normal ejection fraction, no significant valvular abnormalities.  Therefore, given past medical history and time since last visit, based on ACC/AHA guidelines, LAKYN ALSTEEN is at acceptable risk for the planned procedure without further cardiovascular testing.   I will route this recommendation to the requesting party via Epic fax function and remove from pre-op pool.  Please call with questions.  Lenna Sciara, NP 11/18/2022, 4:31 PM

## 2022-11-18 NOTE — Telephone Encounter (Signed)
  Pt is following up if Dr. Fletcher Anon faxed her clearance to her pcp Webb Silversmith NP

## 2022-11-19 NOTE — Telephone Encounter (Signed)
Duplicate message. 

## 2022-11-19 NOTE — Telephone Encounter (Signed)
This is a duplicate message   Thanks,   -Mickel Baas

## 2022-11-19 NOTE — Telephone Encounter (Signed)
Will need another release form from surgeons office

## 2022-11-20 DIAGNOSIS — M960 Pseudarthrosis after fusion or arthrodesis: Secondary | ICD-10-CM | POA: Insufficient documentation

## 2022-11-21 DIAGNOSIS — Z681 Body mass index (BMI) 19 or less, adult: Secondary | ICD-10-CM | POA: Diagnosis not present

## 2022-11-21 DIAGNOSIS — M4326 Fusion of spine, lumbar region: Secondary | ICD-10-CM | POA: Diagnosis not present

## 2022-11-21 DIAGNOSIS — M532X6 Spinal instabilities, lumbar region: Secondary | ICD-10-CM | POA: Diagnosis not present

## 2022-11-21 DIAGNOSIS — M960 Pseudarthrosis after fusion or arthrodesis: Secondary | ICD-10-CM | POA: Diagnosis not present

## 2022-11-22 DIAGNOSIS — Z681 Body mass index (BMI) 19 or less, adult: Secondary | ICD-10-CM | POA: Diagnosis not present

## 2022-11-22 DIAGNOSIS — Z87891 Personal history of nicotine dependence: Secondary | ICD-10-CM | POA: Diagnosis not present

## 2022-11-22 DIAGNOSIS — M4726 Other spondylosis with radiculopathy, lumbar region: Secondary | ICD-10-CM | POA: Diagnosis present

## 2022-11-22 DIAGNOSIS — M960 Pseudarthrosis after fusion or arthrodesis: Secondary | ICD-10-CM | POA: Diagnosis present

## 2022-11-22 DIAGNOSIS — T84216A Breakdown (mechanical) of internal fixation device of vertebrae, initial encounter: Secondary | ICD-10-CM | POA: Diagnosis not present

## 2022-11-22 DIAGNOSIS — T84226A Displacement of internal fixation device of vertebrae, initial encounter: Secondary | ICD-10-CM | POA: Diagnosis present

## 2022-11-22 DIAGNOSIS — M532X6 Spinal instabilities, lumbar region: Secondary | ICD-10-CM | POA: Diagnosis not present

## 2022-11-22 DIAGNOSIS — E0590 Thyrotoxicosis, unspecified without thyrotoxic crisis or storm: Secondary | ICD-10-CM | POA: Diagnosis present

## 2022-11-22 DIAGNOSIS — J449 Chronic obstructive pulmonary disease, unspecified: Secondary | ICD-10-CM | POA: Diagnosis present

## 2022-11-22 DIAGNOSIS — R634 Abnormal weight loss: Secondary | ICD-10-CM | POA: Diagnosis present

## 2022-11-22 DIAGNOSIS — M48061 Spinal stenosis, lumbar region without neurogenic claudication: Secondary | ICD-10-CM | POA: Diagnosis present

## 2022-11-22 DIAGNOSIS — Z981 Arthrodesis status: Secondary | ICD-10-CM | POA: Diagnosis not present

## 2022-11-22 DIAGNOSIS — M48062 Spinal stenosis, lumbar region with neurogenic claudication: Secondary | ICD-10-CM | POA: Diagnosis not present

## 2022-11-22 DIAGNOSIS — Z4889 Encounter for other specified surgical aftercare: Secondary | ICD-10-CM | POA: Diagnosis not present

## 2022-11-25 DIAGNOSIS — T84226A Displacement of internal fixation device of vertebrae, initial encounter: Secondary | ICD-10-CM | POA: Diagnosis present

## 2022-11-25 DIAGNOSIS — M48061 Spinal stenosis, lumbar region without neurogenic claudication: Secondary | ICD-10-CM | POA: Diagnosis present

## 2022-11-25 DIAGNOSIS — R634 Abnormal weight loss: Secondary | ICD-10-CM | POA: Diagnosis present

## 2022-11-25 DIAGNOSIS — E0590 Thyrotoxicosis, unspecified without thyrotoxic crisis or storm: Secondary | ICD-10-CM | POA: Diagnosis present

## 2022-11-25 DIAGNOSIS — M960 Pseudarthrosis after fusion or arthrodesis: Secondary | ICD-10-CM | POA: Diagnosis present

## 2022-11-25 DIAGNOSIS — M4726 Other spondylosis with radiculopathy, lumbar region: Secondary | ICD-10-CM | POA: Diagnosis present

## 2022-11-25 DIAGNOSIS — Z87891 Personal history of nicotine dependence: Secondary | ICD-10-CM | POA: Diagnosis not present

## 2022-11-25 DIAGNOSIS — Z4889 Encounter for other specified surgical aftercare: Secondary | ICD-10-CM | POA: Diagnosis not present

## 2022-11-25 DIAGNOSIS — Z681 Body mass index (BMI) 19 or less, adult: Secondary | ICD-10-CM | POA: Diagnosis not present

## 2022-11-25 DIAGNOSIS — J449 Chronic obstructive pulmonary disease, unspecified: Secondary | ICD-10-CM | POA: Diagnosis present

## 2022-11-25 DIAGNOSIS — M532X6 Spinal instabilities, lumbar region: Secondary | ICD-10-CM | POA: Diagnosis not present

## 2022-11-25 DIAGNOSIS — Z981 Arthrodesis status: Secondary | ICD-10-CM | POA: Diagnosis not present

## 2022-11-25 DIAGNOSIS — M48062 Spinal stenosis, lumbar region with neurogenic claudication: Secondary | ICD-10-CM | POA: Diagnosis not present

## 2022-11-25 DIAGNOSIS — T84216A Breakdown (mechanical) of internal fixation device of vertebrae, initial encounter: Secondary | ICD-10-CM | POA: Diagnosis not present

## 2022-11-27 ENCOUNTER — Telehealth: Payer: Self-pay

## 2022-11-27 NOTE — Telephone Encounter (Signed)
Transition Care Management Follow-up Telephone Call Date of discharge and from where: TCM DC [facility] [place] 11-26-22 Dx: L2-L5 revision  How have you been since you were released from the hospital? Feeling terrible- feels sore  Any questions or concerns? No  Items Reviewed: Did the pt receive and understand the discharge instructions provided? Yes  Medications obtained and verified? Yes  Other? No  Any new allergies since your discharge? No  Dietary orders reviewed? Yes Do you have support at home? Yes   Home Care and Equipment/Supplies: Were home health services ordered? no If so, what is the name of the agency? na  Has the agency set up a time to come to the patient's home? not applicable Were any new equipment or medical supplies ordered?  No What is the name of the medical supply agency? na Were you able to get the supplies/equipment? not applicable Do you have any questions related to the use of the equipment or supplies? No  Functional Questionnaire: (I = Independent and D = Dependent) ADLs: I  Bathing/Dressing- I  Meal Prep- I  Eating- I  Maintaining continence- I  Transferring/Ambulation- I  Managing Meds- I  Follow up appointments reviewed:  PCP Hospital f/u appt confirmed? No  . [facility] f/u appt confirmed? Yes  Scheduled to see surgeron in a couple of weeks Are transportation arrangements needed? No  If their condition worsens, is the pt aware to call PCP or go to the Emergency Dept.? Yes Was the patient provided with contact information for the PCP's office or ED? Yes Was to pt encouraged to call back with questions or concerns? Yes   Juanda Crumble LPN [place] Direct Dial [phone]

## 2022-12-09 ENCOUNTER — Telehealth: Payer: Self-pay | Admitting: Pulmonary Disease

## 2022-12-09 MED ORDER — ALBUTEROL SULFATE HFA 108 (90 BASE) MCG/ACT IN AERS: 1.0000 | INHALATION_SPRAY | Freq: Four times a day (QID) | RESPIRATORY_TRACT | 2 refills | Status: DC | PRN

## 2022-12-09 NOTE — Telephone Encounter (Signed)
I have sent in the refill and notified the patient.  Nothing further needed.

## 2022-12-24 DIAGNOSIS — M4316 Spondylolisthesis, lumbar region: Secondary | ICD-10-CM | POA: Diagnosis not present

## 2022-12-27 ENCOUNTER — Ambulatory Visit (INDEPENDENT_AMBULATORY_CARE_PROVIDER_SITE_OTHER): Payer: No Typology Code available for payment source

## 2022-12-27 VITALS — Ht 64.0 in | Wt 99.0 lb

## 2022-12-27 DIAGNOSIS — Z0000 Encounter for general adult medical examination without abnormal findings: Secondary | ICD-10-CM | POA: Diagnosis not present

## 2022-12-27 NOTE — Patient Instructions (Signed)
[name] , Thank you for taking time to come for your Medicare Wellness Visit. I appreciate your ongoing commitment to your health goals. Please review the following plan we discussed and let me know if I can assist you in the future.   These are the goals we discussed:  Goals      DIET - EAT MORE FRUITS AND VEGETABLES        This is a list of the screening recommended for you and due dates:  Health Maintenance  Topic Date Due   DTaP/Tdap/Td vaccine (1 - Tdap) Never done   Cologuard (Stool DNA test)  Never done   Screening for Lung Cancer  Never done   Mammogram  Never done   Zoster (Shingles) Vaccine (1 of 2) Never done   DEXA scan (bone density measurement)  Never done   COVID-19 Vaccine (7 - 2023-24 season) 09/27/2022   Pneumonia Vaccine (2 of 2 - PPSV23 or PCV20) 03/28/2023*   Medicare Annual Wellness Visit  12/27/2023   Flu Shot  Completed   Hepatitis C Screening: USPSTF Recommendation to screen - Ages 18-79 yo.  Addressed   HPV Vaccine  Aged Out  *Topic was postponed. The date shown is not the original due date.    Advanced directives: no  Conditions/risks identified: none  Next appointment: Follow up in one year for your annual wellness visit 01/02/24 @ 9:45 am by phone   Preventive Care 65 Years and Older, Female Preventive care refers to lifestyle choices and visits with your health care provider that can promote health and wellness. What does preventive care include? A yearly physical exam. This is also called an annual well check. Dental exams once or twice a year. Routine eye exams. Ask your health care provider how often you should have your eyes checked. Personal lifestyle choices, including: Daily care of your teeth and gums. Regular physical activity. Eating a healthy diet. Avoiding tobacco and drug use. Limiting alcohol use. Practicing safe sex. Taking low-dose aspirin every day. Taking vitamin and mineral supplements as recommended by your health  care provider. What happens during an annual well check? The services and screenings done by your health care provider during your annual well check will depend on your age, overall health, lifestyle risk factors, and family history of disease. Counseling  Your health care provider may ask you questions about your: Alcohol use. Tobacco use. Drug use. Emotional well-being. Home and relationship well-being. Sexual activity. Eating habits. History of falls. Memory and ability to understand (cognition). Work and work Statistician. Reproductive health. Screening  You may have the following tests or measurements: Height, weight, and BMI. Blood pressure. Lipid and cholesterol levels. These may be checked every 5 years, or more frequently if you are over 62 years old. Skin check. Lung cancer screening. You may have this screening every year starting at age 47 if you have a 30-pack-year history of smoking and currently smoke or have quit within the past 15 years. Fecal occult blood test (FOBT) of the stool. You may have this test every year starting at age 34. Flexible sigmoidoscopy or colonoscopy. You may have a sigmoidoscopy every 5 years or a colonoscopy every 10 years starting at age 71. Hepatitis C blood test. Hepatitis B blood test. Sexually transmitted disease (STD) testing. Diabetes screening. This is done by checking your blood sugar (glucose) after you have not eaten for a while (fasting). You may have this done every 1-3 years. Bone density scan. This is done to  screen for osteoporosis. You may have this done starting at age 42. Mammogram. This may be done every 1-2 years. Talk to your health care provider about how often you should have regular mammograms. Talk with your health care provider about your test results, treatment options, and if necessary, the need for more tests. Vaccines  Your health care provider may recommend certain vaccines, such as: Influenza vaccine. This is  recommended every year. Tetanus, diphtheria, and acellular pertussis (Tdap, Td) vaccine. You may need a Td booster every 10 years. Zoster vaccine. You may need this after age 82. Pneumococcal 13-valent conjugate (PCV13) vaccine. One dose is recommended after age 20. Pneumococcal polysaccharide (PPSV23) vaccine. One dose is recommended after age 13. Talk to your health care provider about which screenings and vaccines you need and how often you need them. This information is not intended to replace advice given to you by your health care provider. Make sure you discuss any questions you have with your health care provider. Document Released: 11/10/2015 Document Revised: 07/03/2016 Document Reviewed: 08/15/2015 Elsevier Interactive Patient Education  2017 [place] Prevention in the Home Falls can cause injuries. They can happen to people of all ages. There are many things you can do to make your home safe and to help prevent falls. What can I do on the outside of my home? Regularly fix the edges of walkways and driveways and fix any cracks. Remove anything that might make you trip as you walk through a door, such as a raised step or threshold. Trim any bushes or trees on the path to your home. Use bright outdoor lighting. Clear any walking paths of anything that might make someone trip, such as rocks or tools. Regularly check to see if handrails are loose or broken. Make sure that both sides of any steps have handrails. Any raised decks and porches should have guardrails on the edges. Have any leaves, snow, or ice cleared regularly. Use sand or salt on walking paths during winter. Clean up any spills in your garage right away. This includes oil or grease spills. What can I do in the bathroom? Use night lights. Install grab bars by the toilet and in the tub and shower. Do not use towel bars as grab bars. Use non-skid mats or decals in the tub or shower. If you need to sit down in  the shower, use a plastic, non-slip stool. Keep the floor dry. Clean up any water that spills on the floor as soon as it happens. Remove soap buildup in the tub or shower regularly. Attach bath mats securely with double-sided non-slip rug tape. Do not have throw rugs and other things on the floor that can make you trip. What can I do in the bedroom? Use night lights. Make sure that you have a light by your bed that is easy to reach. Do not use any sheets or blankets that are too big for your bed. They should not hang down onto the floor. Have a firm chair that has side arms. You can use this for support while you get dressed. Do not have throw rugs and other things on the floor that can make you trip. What can I do in the kitchen? Clean up any spills right away. Avoid walking on wet floors. Keep items that you use a lot in easy-to-reach places. If you need to reach something above you, use a strong step stool that has a grab bar. Keep electrical cords out of the way.  Do not use floor polish or wax that makes floors slippery. If you must use wax, use non-skid floor wax. Do not have throw rugs and other things on the floor that can make you trip. What can I do with my stairs? Do not leave any items on the stairs. Make sure that there are handrails on both sides of the stairs and use them. Fix handrails that are broken or loose. Make sure that handrails are as long as the stairways. Check any carpeting to make sure that it is firmly attached to the stairs. Fix any carpet that is loose or worn. Avoid having throw rugs at the top or bottom of the stairs. If you do have throw rugs, attach them to the floor with carpet tape. Make sure that you have a light switch at the top of the stairs and the bottom of the stairs. If you do not have them, ask someone to add them for you. What else can I do to help prevent falls? Wear shoes that: Do not have high heels. Have rubber bottoms. Are comfortable  and fit you well. Are closed at the toe. Do not wear sandals. If you use a stepladder: Make sure that it is fully opened. Do not climb a closed stepladder. Make sure that both sides of the stepladder are locked into place. Ask someone to hold it for you, if possible. Clearly mark and make sure that you can see: Any grab bars or handrails. First and last steps. Where the edge of each step is. Use tools that help you move around (mobility aids) if they are needed. These include: Canes. Walkers. Scooters. Crutches. Turn on the lights when you go into a dark area. Replace any light bulbs as soon as they burn out. Set up your furniture so you have a clear path. Avoid moving your furniture around. If any of your floors are uneven, fix them. If there are any pets around you, be aware of where they are. Review your medicines with your doctor. Some medicines can make you feel dizzy. This can increase your chance of falling. Ask your doctor what other things that you can do to help prevent falls. This information is not intended to replace advice given to you by your health care provider. Make sure you discuss any questions you have with your health care provider. Document Released: 08/10/2009 Document Revised: 03/21/2016 Document Reviewed: 11/18/2014 Elsevier Interactive Patient Education  2017 [employer].

## 2022-12-27 NOTE — Progress Notes (Signed)
I connected with  Yvone Neu on 12/27/22 by a audio enabled telemedicine application and verified that I am speaking with the correct person using two identifiers.  Patient Location: Home  Provider Location: Office/Clinic  I discussed the limitations of evaluation and management by telemedicine. The patient expressed understanding and agreed to proceed.  Subjective:   NAJE PULKRABEK is a 72 y.o. female who presents for Medicare Annual (Subsequent) preventive examination.  Review of Systems     Cardiac Risk Factors include: advanced age (>29mn, >>74women)     Objective:    There were no vitals filed for this visit. There is no height or weight on file to calculate BMI.     12/27/2022   10:01 AM 09/25/2019    4:42 PM 05/17/2017   12:43 PM 05/21/2012    3:40 PM 05/15/2012    3:01 PM  Advanced Directives  Does Patient Have a Medical Advance Directive? No No No Patient does not have advance directive;Patient would not like information Patient does not have advance directive;Patient would not like information  Would patient like information on creating a medical advance directive? No - Patient declined      Pre-existing out of facility DNR order (yellow form or pink MOST form)    No     Current Medications (verified) Outpatient Encounter Medications as of 12/27/2022  Medication Sig   albuterol (VENTOLIN HFA) 108 (90 Base) MCG/ACT inhaler Inhale 1-2 puffs into the lungs every 6 (six) hours as needed for wheezing or shortness of breath.   Budeson-Glycopyrrol-Formoterol (BREZTRI AEROSPHERE) 160-9-4.8 MCG/ACT AERO Inhale 2 puffs into the lungs in the morning and at bedtime.   ibuprofen (ADVIL) 800 MG tablet Take 800 mg by mouth 2 (two) times daily as needed.   oxyCODONE-acetaminophen (PERCOCET/ROXICET) 5-325 MG tablet Take 1 tablet by mouth at bedtime as needed.   cyclobenzaprine (FLEXERIL) 10 MG tablet Take 10 mg by mouth 2 (two) times daily as needed. (Patient not taking: Reported  on 12/27/2022)   triazolam (HALCION) 0.25 MG tablet 0.25 mg at bedtime as needed. (Patient not taking: Reported on 12/27/2022)   No facility-administered encounter medications on file as of 12/27/2022.    Allergies (verified) Nsaids   History: Past Medical History:  Diagnosis Date   Abnormal EKG    HX OF PREVIOUS EKGS SHOWING POSSIBLE MI--BUT PT HAS NEVER HAD ANY HEART PROBLEMS--[facility] PROBLE[facility]   Chronic cough    PT IS A SMOKER   DDD (degenerative disc disease), lumbar    PAIN IN LOWER BACK AND DOWN RT HIP AND LEG--NUMBNESS, THROBBING, BURNING   Hyperthyroidism 02/20/2015   Past Surgical History:  Procedure Laterality Date   BACK SURGERY  05/2011   [facility]     Family History  Problem Relation Age of Onset   Heart disease Mother    Heart attack Father    Heart disease Father    Stroke Maternal Grandmother    Diabetes Maternal Grandmother    Social History   Socioeconomic History   Marital status: Single    Spouse name: Not on file   Number of children: Not on file   Years of education: Not on file   Highest education level: Not on file  Occupational History   Not on file  Tobacco Use   Smoking status: Former    Packs/day: 2.00  Years: 30.00    Total pack years: 60.00    Types: Cigarettes    Quit date: 03/13/2022    Years since quitting: 0.7   Smokeless tobacco: Never   Tobacco comments:    Has been smoking off and on. Right now she is not smoking. 09/13/2022  Vaping Use   Vaping Use: Never used  Substance and Sexual Activity   Alcohol use: Yes    Alcohol/week: 0.0 standard drinks of alcohol    Comment: rare   Drug use: No   Sexual activity: Not Currently  Other Topics Concern   Not on file  Social History Narrative   Not on file   Social Determinants of Health   Financial Resource Strain: Low Risk  (12/27/2022)   Overall Financial Resource Strain  (CARDIA)    Difficulty of Paying Living Expenses: Not very hard  Food Insecurity: No Food Insecurity (12/27/2022)   Hunger Vital Sign    Worried About Running Out of Food in the Last Year: Never true    Ran Out of Food in the Last Year: Never true  Transportation Needs: No Transportation Needs (12/27/2022)   PRAPARE - Hydrologist (Medical): No    Lack of Transportation (Non-Medical): No  Physical Activity: Insufficiently Active (12/27/2022)   Exercise Vital Sign    Days of Exercise per Week: 3 days    Minutes of Exercise per Session: 30 min  Stress: No Stress Concern Present (12/27/2022)   [place]    Feeling of Stress : Not at all  Social Connections: Socially Isolated (12/27/2022)   Social Connection and Isolation Panel [NHANES]    Frequency of Communication with Friends and Family: Three times a week    Frequency of Social Gatherings with Friends and Family: Never    Attends Religious Services: Never    Marine scientist or Organizations: No    Attends Archivist Meetings: Never    Marital Status: Divorced    Tobacco Counseling Counseling given: Not Answered Tobacco comments: Has been smoking off and on. Right now she is not smoking. 09/13/2022   Clinical Intake:  Pre-visit preparation completed: Yes  Pain : No/denies pain     Nutritional Risks: None Diabetes: No  How often do you need to have someone help you when you read instructions, pamphlets, or other written materials from your doctor or pharmacy?: 1 - Never  Diabetic?no  Interpreter Needed?: No  Information entered by :: Kirke Shaggy, LPN   Activities of Daily Living    12/27/2022   10:02 AM 09/26/2022    1:51 PM  In your present state of health, do you have any difficulty performing the following activities:  Hearing? 0 0  Vision? 0 0  Difficulty concentrating or making decisions? 0 0  Walking  or climbing stairs? 0 1  Dressing or bathing? 0 1  Doing errands, shopping? 0 1  Preparing Food and eating ? N   Using the Toilet? N   In the past six months, have you accidently leaked urine? N   Do you have problems with loss of bowel control? N   Managing your Medications? N   Managing your Finances? N   Housekeeping or managing your Housekeeping? N     Patient Care Team: Jearld Fenton, NP as PCP - General (Internal Medicine) Wellington Hampshire, MD as PCP - Cardiology (Cardiology) Tyler Pita, MD as Consulting Physician (Pulmonary  Disease)  Indicate any recent Medical Services you may have received from other than [facility] providers in the past year (date may be approximate).     Assessment:   This is a routine wellness examination for Diany.  Hearing/Vision screen Hearing Screening - Comments:: No aids Vision Screening - Comments:: readers  Dietary issues and exercise activities discussed: Current Exercise Habits: Home exercise routine, Type of exercise: walking, Time (Minutes): 30, Frequency (Times/Week): 3, Weekly Exercise (Minutes/Week): 90, Intensity: Mild   Goals Addressed             This Visit's Progress    DIET - EAT MORE FRUITS AND VEGETABLES         Depression Screen    12/27/2022    9:59 AM 09/26/2022    1:50 PM 07/20/2021    2:10 PM 07/20/2020    2:36 PM 08/25/2018    2:39 PM 08/25/2018    2:37 PM 02/20/2015    3:36 PM  PHQ 2/9 Scores  PHQ - 2 Score 0 '3 1 4 '$ 0 0 0  PHQ- 9 Score 0 '6 1 11       '$ Fall Risk    12/27/2022   10:02 AM 09/26/2022    1:51 PM 07/20/2021    2:10 PM 07/20/2020    2:38 PM 08/25/2018    2:39 PM  Fall Risk   Falls in the past year? 0 0 0 0 No  Number falls in past yr: 0  0    Injury with Fall? 0 0 0    Risk for fall due to : No Fall Risks  No Fall Risks    Follow up Falls prevention discussed;Falls evaluation completed  Falls evaluation completed      FALL RISK PREVENTION PERTAINING TO THE HOME:  Any stairs in or  around the home? Yes  If so, are there any without handrails? No  Home free of loose throw rugs in walkways, pet beds, electrical cords, etc? Yes  Adequate lighting in your home to reduce risk of falls? Yes   ASSISTIVE DEVICES UTILIZED TO PREVENT FALLS:  Life alert? No  Use of a cane, walker or w/c? No  Grab bars in the bathroom? Yes  Shower chair or bench in shower? No  Elevated toilet seat or a handicapped toilet? Yes    Cognitive Function:        12/27/2022   10:07 AM  6CIT Screen  What Year? 0 points  What month? 0 points  What time? 0 points  Count back from 20 0 points  Months in reverse 0 points  Repeat phrase 2 points  Total Score 2 points    Immunizations Immunization History  Administered Date(s) Administered   Fluad Quad(high Dose 65+) 07/26/2019, 08/02/2022   Influenza, High Dose Seasonal PF 08/25/2018, 07/10/2021   Influenza,inj,Quad PF,6+ Mos 09/11/2016   Moderna Covid-19 Vaccine Bivalent Booster 60yr & up 07/10/2021   Moderna SARS-COV2 Booster Vaccination 08/21/2020, 08/02/2022   Moderna Sars-Covid-2 Vaccination 12/02/2019, 12/29/2019, 08/11/2020   Pneumococcal Conjugate-13 08/25/2018    TDAP status: Due, Education has been provided regarding the importance of this vaccine. Advised may receive this vaccine at local pharmacy or Health Dept. Aware to provide a copy of the vaccination record if obtained from local pharmacy or Health Dept. Verbalized acceptance and understanding.  Flu Vaccine status: Up to date  Pneumococcal vaccine status: Up to date  Covid-19 vaccine status: Completed vaccines  Qualifies for Shingles Vaccine? Yes   Zostavax completed No  Shingrix Completed?: No.    Education has been provided regarding the importance of this vaccine. Patient has been advised to call insurance company to determine out of pocket expense if they have not yet received this vaccine. Advised may also receive vaccine at local pharmacy or Health Dept.  Verbalized acceptance and understanding.  Screening Tests Health Maintenance  Topic Date Due   DTaP/Tdap/Td (1 - Tdap) Never done   Fecal DNA (Cologuard)  Never done   Lung Cancer Screening  Never done   MAMMOGRAM  Never done   Zoster Vaccines- Shingrix (1 of 2) Never done   DEXA SCAN  Never done   COVID-19 Vaccine (7 - 2023-24 season) 09/27/2022   Pneumonia Vaccine 62+ Years old (2 of 2 - PPSV23 or PCV20) 03/28/2023 (Originally 10/20/2018)   Medicare Annual Wellness (AWV)  12/27/2023   INFLUENZA VACCINE  Completed   Hepatitis C Screening  Addressed   HPV VACCINES  Aged Out    Health Maintenance  Health Maintenance Due  Topic Date Due   DTaP/Tdap/Td (1 - Tdap) Never done   Fecal DNA (Cologuard)  Never done   Lung Cancer Screening  Never done   MAMMOGRAM  Never done   Zoster Vaccines- Shingrix (1 of 2) Never done   DEXA SCAN  Never done   COVID-19 Vaccine (7 - 2023-24 season) 09/27/2022    Declined referral for colonoscopy and mammogram and BDS   Lung Cancer Screening: (Low Dose CT Chest recommended if Age 89-80 years, 30 pack-year currently smoking OR have quit w/in 15years.) does not qualify.    Additional Screening:  Hepatitis C Screening: does qualify; Completed 10/01/16  Vision Screening: Recommended annual ophthalmology exams for early detection of glaucoma and other disorders of the eye. Is the patient up to date with their annual eye exam?  No  Who is the provider or what is the name of the office in which the patient attends annual eye exams? No one If pt is not established with a provider, would they like to be referred to a provider to establish care? No .   Dental Screening: Recommended annual dental exams for proper oral hygiene  Community Resource Referral / Chronic Care Management: CRR required this visit?  No   CCM required this visit?  No      Plan:     I have personally reviewed and noted the following in the patient's chart:   Medical and  social history Use of alcohol, tobacco or illicit drugs  Current medications and supplements including opioid prescriptions. Patient is not currently taking opioid prescriptions. Functional ability and status Nutritional status Physical activity Advanced directives List of other physicians Hospitalizations, surgeries, and ER visits in previous 12 months Vitals Screenings to include cognitive, depression, and falls Referrals and appointments  In addition, I have reviewed and discussed with patient certain preventive protocols, quality metrics, and best practice recommendations. A written personalized care plan for preventive services as well as general preventive health recommendations were provided to patient.     Dionisio David, LPN   [id number]   Nurse Notes: none

## 2023-01-10 ENCOUNTER — Ambulatory Visit: Payer: Medicare Other | Attending: Cardiovascular Disease

## 2023-01-10 DIAGNOSIS — I739 Peripheral vascular disease, unspecified: Secondary | ICD-10-CM | POA: Insufficient documentation

## 2023-01-10 LAB — VAS US LOWER EXT ART SEG MULTI (SEGMENTALS & LE RAYNAUDS)
Left ABI: 0.41
Right ABI: 0.46

## 2023-01-14 ENCOUNTER — Encounter: Payer: Self-pay | Admitting: Cardiovascular Disease

## 2023-01-14 ENCOUNTER — Ambulatory Visit: Payer: Medicare Other | Attending: Cardiovascular Disease | Admitting: Cardiovascular Disease

## 2023-01-14 VITALS — BP 150/74 | HR 95 | Ht 64.0 in | Wt 103.1 lb

## 2023-01-14 DIAGNOSIS — I739 Peripheral vascular disease, unspecified: Secondary | ICD-10-CM | POA: Diagnosis not present

## 2023-01-14 DIAGNOSIS — Z01818 Encounter for other preprocedural examination: Secondary | ICD-10-CM | POA: Insufficient documentation

## 2023-01-14 DIAGNOSIS — E785 Hyperlipidemia, unspecified: Secondary | ICD-10-CM | POA: Diagnosis not present

## 2023-01-14 MED ORDER — ASPIRIN 81 MG PO TBEC: 81.0000 mg | DELAYED_RELEASE_TABLET | Freq: Every day | ORAL | 3 refills | Status: AC

## 2023-01-14 MED ORDER — ROSUVASTATIN CALCIUM 10 MG PO TABS: 10.0000 mg | ORAL_TABLET | Freq: Every day | ORAL | 3 refills | Status: DC

## 2023-01-14 NOTE — Progress Notes (Unsigned)
Cardiology Office Note   Date:  01/16/2023   ID:  Pamela Haley, DOB 06-07-1951, MRN [id number]  PCP:  Jearld Fenton, NP  Cardiologist:   Kathlyn Sacramento, MD   Chief Complaint  Patient presents with   Other    F/u echo/LE [place]. Meds reviewed verbally with pt.      History of Present Illness: Pamela Haley is a 72 y.o. female who is here today for follow-up visit regarding peripheral arterial disease.  She was seen recently for preoperative cardiovascular evaluation before lumbar spine surgery after she was found to have an abnormal EKG suggestive of a prior infarct.   She has known history of extensive tobacco use, COPD, hyperlipidemia and chronic back pain.  She smoked 1 pack/day for 40 years and quit smoking last week.  She suffers from low back pain at rest and with exertion.  In addition, her feet are always cold.  She was found to have very diminished femoral pulses. He had an echocardiogram done which showed normal LV systolic function with no significant valvular abnormalities.  Lower extremity arterial Doppler was done and showed severely reduced ABI bilaterally with evidence of severe bilateral common iliac artery disease with systolic velocities greater than 500.  No significant infrainguinal disease.  Since her back surgery, she reports significant improvement in lower back discomfort.  However, she continues to be limited by severe bilateral hip pain with minimal exertion.  Her feet feel extremely cold especially at night and she has hard time sleeping due to that.  She cut down on tobacco use to 3 cigarettes daily.  Past Medical History:  Diagnosis Date   Abnormal EKG    HX OF PREVIOUS EKGS SHOWING POSSIBLE MI--BUT PT HAS NEVER HAD ANY HEART PROBLEMS--HAD BACK SURGERY AUG 2012 AT [facility] - NO HEART PROBLEMS   Chronic cough    PT IS A SMOKER   DDD (degenerative disc disease), lumbar    PAIN IN LOWER BACK AND DOWN RT HIP AND LEG--NUMBNESS, THROBBING, BURNING    Hyperthyroidism 02/20/2015    Past Surgical History:  Procedure Laterality Date   BACK SURGERY  05/2011   lumbar surgery at [facility]   [place] X 3       Current Outpatient Medications  Medication Sig Dispense Refill   albuterol (VENTOLIN HFA) 108 (90 Base) MCG/ACT inhaler Inhale 1-2 puffs into the lungs every 6 (six) hours as needed for wheezing or shortness of breath. 18 g 2   Ascorbic Acid (VITAMIN C PO) Take by mouth daily.     aspirin EC 81 MG tablet Take 1 tablet (81 mg total) by mouth daily. Swallow whole. 90 tablet 3   B Complex-C (B-COMPLEX WITH VITAMIN C) tablet Take 1 tablet by mouth daily.     Budeson-Glycopyrrol-Formoterol (BREZTRI AEROSPHERE) 160-9-4.8 MCG/ACT AERO Inhale 2 puffs into the lungs in the morning and at bedtime. 10.7 g 11   Collagen-Vitamin C-Biotin (COLLAGEN 1500/C) 500-50-0.8 MG CAPS Take 1 tablet by mouth daily.     cyclobenzaprine (FLEXERIL) 10 MG tablet Take 10 mg by mouth 2 (two) times daily as needed.     Gelatin 600 MG CAPS Take 3 capsules by mouth daily.     oxyCODONE-acetaminophen (PERCOCET/ROXICET) 5-325 MG tablet Take 1 tablet by mouth at bedtime as needed.     rosuvastatin (CRESTOR) 10 MG tablet Take 1 tablet (10 mg total) by mouth daily. 90 tablet 3   triazolam (HALCION)  0.25 MG tablet 0.25 mg at bedtime as needed.     ibuprofen (ADVIL) 800 MG tablet Take 800 mg by mouth 2 (two) times daily as needed. (Patient not taking: Reported on 01/14/2023)     No current facility-administered medications for this visit.    Allergies:   Patient has no active allergies.    Social History:  The patient  reports that she quit smoking about 10 months ago. Her smoking use included cigarettes. She has a 7.50 pack-year smoking history. She has never used smokeless tobacco. She reports current alcohol use. She reports that she does not use drugs.   Family History:  The patient's family history includes Diabetes in her maternal  grandmother; Heart attack in her father; Heart disease in her father and mother; Stroke in her maternal grandmother.    ROS:  Please see the history of present illness.   Otherwise, review of systems are positive for none.   All other systems are reviewed and negative.    PHYSICAL EXAM: VS:  BP (!) 150/74 (BP Location: Left Arm, Patient Position: Sitting, Cuff Size: Normal) Comment: Recheck after work up.  Pulse 95   Ht 5\' 4"  (1.626 m)   Wt 103 lb 2 oz (46.8 kg)   SpO2 95%   BMI 17.70 kg/m  , BMI Body mass index is 17.7 kg/m. GEN: Well nourished, well developed, in no acute distress  HEENT: normal  Neck: no JVD, carotid bruits, or masses Cardiac: RRR; no murmurs, rubs, or gallops,no edema  Respiratory:  clear to auscultation bilaterally, normal work of breathing GI: soft, nontender, nondistended, + BS MS: no deformity or atrophy  Skin: warm and dry, no rash Neuro:  Strength and sensation are intact Psych: euthymic mood, full affect Vascular: Femoral pulses +1 on the right and nonpalpable on the left.  Dorsalis pedis is absent bilaterally.  Posterior tibial is barely palpable bilaterally.   EKG:  EKG is not ordered today.    Recent Labs: 10/09/2022: ALT 11; BUN 16; Creatinine, Ser 0.76; Hemoglobin 16.2; Platelets 381; Potassium 4.0; Sodium 145; TSH 0.359    Lipid Panel    Component Value Date/Time   CHOL 233 (H) 07/31/2021 1024   TRIG 148 07/31/2021 1024   HDL 44 07/31/2021 1024   CHOLHDL 5.3 (H) 07/31/2021 1024   LDLCALC 162 (H) 07/31/2021 1024      Wt Readings from Last 3 Encounters:  01/14/23 103 lb 2 oz (46.8 kg)  12/27/22 99 lb (44.9 kg)  11/06/22 99 lb (44.9 kg)           No data to display            ASSESSMENT AND PLAN:  1.  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  She has evidence of severe subocclusive disease affecting bilateral common iliac arteries which is  the likely culprit for her symptoms.  Given severity of her disease and symptoms, I recommend proceeding with abdominal aortogram with lower extremity angiography and possible endovascular intervention.  I discussed the procedure in details as well as risk and benefits.   I started aspirin 81 mg once daily.  2.  Hyperlipidemia: Lipid profile in 2022 showed an LDL of 162.  She reports vague symptoms of myalgia with some statins.  I elected to start a small dose rosuvastatin 10 mg daily  3.  Tobacco use: I discussed with her the importance of smoking cessation.  Disposition: Proceed with angiography and follow-up after.  Signed,  Kathlyn Sacramento, MD  01/16/2023 7:54 AM    [place]

## 2023-01-14 NOTE — H&P (View-Only) (Signed)
  Cardiology Office Note   Date:  01/16/2023   ID:  Pamela Haley, DOB 02/19/1951, MRN [id number]  PCP:  Baity, Regina W, NP  Cardiologist:   Cigi Bega, MD   Chief Complaint  Patient presents with   Other    F/u echo/LE us. Meds reviewed verbally with pt.      History of Present Illness: Pamela Haley is a 72 y.o. female who is here today for follow-up visit regarding peripheral arterial disease.  She was seen recently for preoperative cardiovascular evaluation before lumbar spine surgery after she was found to have an abnormal EKG suggestive of a prior infarct.   She has known history of extensive tobacco use, COPD, hyperlipidemia and chronic back pain.  She smoked 1 pack/day for 40 years and quit smoking last week.  She suffers from low back pain at rest and with exertion.  In addition, her feet are always cold.  She was found to have very diminished femoral pulses. He had an echocardiogram done which showed normal LV systolic function with no significant valvular abnormalities.  Lower extremity arterial Doppler was done and showed severely reduced ABI bilaterally with evidence of severe bilateral common iliac artery disease with systolic velocities greater than 500.  No significant infrainguinal disease.  Since her back surgery, she reports significant improvement in lower back discomfort.  However, she continues to be limited by severe bilateral hip pain with minimal exertion.  Her feet feel extremely cold especially at night and she has hard time sleeping due to that.  She cut down on tobacco use to 3 cigarettes daily.  Past Medical History:  Diagnosis Date   Abnormal EKG    HX OF PREVIOUS EKGS SHOWING POSSIBLE MI--BUT PT HAS NEVER HAD ANY HEART PROBLEMS--HAD BACK SURGERY AUG 2012 AT [facility] - NO HEART PROBLEMS   Chronic cough    PT IS A SMOKER   DDD (degenerative disc disease), lumbar    PAIN IN LOWER BACK AND DOWN RT HIP AND LEG--NUMBNESS, THROBBING, BURNING    Hyperthyroidism 02/20/2015    Past Surgical History:  Procedure Laterality Date   BACK SURGERY  05/2011   lumbar surgery at [facility]   BILATERAL CARPAL TUNNEL RELEASE     C SECTIONS X 3       Current Outpatient Medications  Medication Sig Dispense Refill   albuterol (VENTOLIN HFA) 108 (90 Base) MCG/ACT inhaler Inhale 1-2 puffs into the lungs every 6 (six) hours as needed for wheezing or shortness of breath. 18 g 2   Ascorbic Acid (VITAMIN C PO) Take by mouth daily.     aspirin EC 81 MG tablet Take 1 tablet (81 mg total) by mouth daily. Swallow whole. 90 tablet 3   B Complex-C (B-COMPLEX WITH VITAMIN C) tablet Take 1 tablet by mouth daily.     Budeson-Glycopyrrol-Formoterol (BREZTRI AEROSPHERE) 160-9-4.8 MCG/ACT AERO Inhale 2 puffs into the lungs in the morning and at bedtime. 10.7 g 11   Collagen-Vitamin C-Biotin (COLLAGEN 1500/C) 500-50-0.8 MG CAPS Take 1 tablet by mouth daily.     cyclobenzaprine (FLEXERIL) 10 MG tablet Take 10 mg by mouth 2 (two) times daily as needed.     Gelatin 600 MG CAPS Take 3 capsules by mouth daily.     oxyCODONE-acetaminophen (PERCOCET/ROXICET) 5-325 MG tablet Take 1 tablet by mouth at bedtime as needed.     rosuvastatin (CRESTOR) 10 MG tablet Take 1 tablet (10 mg total) by mouth daily. 90 tablet 3   triazolam (HALCION)   0.25 MG tablet 0.25 mg at bedtime as needed.     ibuprofen (ADVIL) 800 MG tablet Take 800 mg by mouth 2 (two) times daily as needed. (Patient not taking: Reported on 01/14/2023)     No current facility-administered medications for this visit.    Allergies:   Patient has no active allergies.    Social History:  The patient  reports that she quit smoking about 10 months ago. Her smoking use included cigarettes. She has a 7.50 pack-year smoking history. She has never used smokeless tobacco. She reports current alcohol use. She reports that she does not use drugs.   Family History:  The patient's family history includes Diabetes in her maternal  grandmother; Heart attack in her father; Heart disease in her father and mother; Stroke in her maternal grandmother.    ROS:  Please see the history of present illness.   Otherwise, review of systems are positive for none.   All other systems are reviewed and negative.    PHYSICAL EXAM: VS:  BP (!) 150/74 (BP Location: Left Arm, Patient Position: Sitting, Cuff Size: Normal) Comment: Recheck after work up.  Pulse 95   Ht 5' 4" (1.626 m)   Wt 103 lb 2 oz (46.8 kg)   SpO2 95%   BMI 17.70 kg/m  , BMI Body mass index is 17.7 kg/m. GEN: Well nourished, well developed, in no acute distress  HEENT: normal  Neck: no JVD, carotid bruits, or masses Cardiac: RRR; no murmurs, rubs, or gallops,no edema  Respiratory:  clear to auscultation bilaterally, normal work of breathing GI: soft, nontender, nondistended, + BS MS: no deformity or atrophy  Skin: warm and dry, no rash Neuro:  Strength and sensation are intact Psych: euthymic mood, full affect Vascular: Femoral pulses +1 on the right and nonpalpable on the left.  Dorsalis pedis is absent bilaterally.  Posterior tibial is barely palpable bilaterally.   EKG:  EKG is not ordered today.    Recent Labs: 10/09/2022: ALT 11; BUN 16; Creatinine, Ser 0.76; Hemoglobin 16.2; Platelets 381; Potassium 4.0; Sodium 145; TSH 0.359    Lipid Panel    Component Value Date/Time   CHOL 233 (H) 07/31/2021 1024   TRIG 148 07/31/2021 1024   HDL 44 07/31/2021 1024   CHOLHDL 5.3 (H) 07/31/2021 1024   LDLCALC 162 (H) 07/31/2021 1024      Wt Readings from Last 3 Encounters:  01/14/23 103 lb 2 oz (46.8 kg)  12/27/22 99 lb (44.9 kg)  11/06/22 99 lb (44.9 kg)           No data to display            ASSESSMENT AND PLAN:  1.  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  She has evidence of severe subocclusive disease affecting bilateral common iliac arteries which is  the likely culprit for her symptoms.  Given severity of her disease and symptoms, I recommend proceeding with abdominal aortogram with lower extremity angiography and possible endovascular intervention.  I discussed the procedure in details as well as risk and benefits.   I started aspirin 81 mg once daily.  2.  Hyperlipidemia: Lipid profile in 2022 showed an LDL of 162.  She reports vague symptoms of myalgia with some statins.  I elected to start a small dose rosuvastatin 10 mg daily  3.  Tobacco use: I discussed with her the importance of smoking cessation.      Disposition: Proceed with angiography and follow-up after.  Signed,  Elesa Garman, MD  01/16/2023 7:54 AM    [facility] 

## 2023-01-14 NOTE — Patient Instructions (Addendum)
Medication Instructions:  START Aspirin 81 mg once daily  START Rosuvastatin 10 mg once daily  *If you need a refill on your cardiac medications before your next appointment, please call your pharmacy*   Lab Work: Your provider would like for you to have following labs drawn: CBC and BMET.   Please go to the [facility] entrance and check in at the front desk.  You do not need an appointment.  They are open from 7am-6 pm.   If you have labs (blood work) drawn today and your tests are completely normal, you will receive your results only by: [place] (if you have MyChart) OR A paper copy in the mail If you have any lab test that is abnormal or we need to change your treatment, we will call you to review the results.   Testing/Procedures: Your physician has requested that you have a peripheral vascular angiogram. This exam is performed at the hospital. During this exam IV contrast is used to look at arterial blood flow. Please review the information sheet given for details.   Follow-Up: At [facility], you and your health needs are our priority.  As part of our continuing mission to provide you with exceptional heart care, we have created designated Provider Care Teams.  These Care Teams include your primary Cardiologist (physician) and Advanced Practice Providers (APPs -  Physician Assistants and Nurse Practitioners) who all work together to provide you with the care you need, when you need it.  We recommend signing up for the patient portal called "MyChart".  Sign up information is provided on this After Visit Summary.  MyChart is used to connect with patients for Virtual Visits (Telemedicine).  Patients are able to view lab/test results, encounter notes, upcoming appointments, etc.  Non-urgent messages can be sent to your provider as well.   To learn more about what you can do with MyChart, go to [email or web address].    Your next appointment:   7-8  week(s)  Provider:   You may see Kathlyn Sacramento, MD or one of the following Advanced Practice Providers on your designated Care Team:   Murray Hodgkins, NP Christell Faith, PA-C Cadence Kathlen Mody, PA-C Gerrie Nordmann, NP    Other Instructions  [place] [facility] DEPT OF [place], [place] [id number] Dept: [phone] Loc: [place]  01/14/2023  You are scheduled for a Peripheral Angiogram on Wednesday, April 17 with Dr. Kathlyn Sacramento.  1. Please arrive at the [facility] (Main Entrance A) at [facility]: [address] [place], [place] [address] at 8:30 AM (This time is two hours before your procedure to ensure your preparation). Free valet parking service is available.   Special note: Every effort is made to have your procedure done on time. Please understand that emergencies sometimes delay scheduled procedures.  2. Diet: Do not eat solid foods after midnight.  The patient may have clear liquids until 5am upon the day of the procedure.  3. Labs: You will need to have blood drawn by 02/03/23. You do not need to be fasting.  4. Medication instructions in preparation for your procedure: Nothing to hold  On the morning of your procedure, take your Aspirin 81 mg and any morning medicines NOT listed above.  You may use sips of water.  5. Plan for one night stay--bring personal belongings. 6. Bring a current list of your  medications and current insurance cards. 7. You MUST have a responsible person to drive you home. 8. Someone MUST be with you the first 24 hours after you arrive home or your discharge will be delayed. 9. Please wear clothes that are easy to get on and off and wear slip-on shoes.  Thank you for allowing [place] to care for you!   -- [facility]   Managing the Challenge of Quitting Smoking Quitting smoking is a  physical and mental challenge. You may have cravings, withdrawal symptoms, and temptation to smoke. Before quitting, work with your health care provider to make a plan that can help you manage quitting. Making a plan before you quit may keep you from smoking when you have the urge to smoke while trying to quit. How to manage lifestyle changes Managing stress Stress can make you want to smoke, and wanting to smoke may cause stress. It is important to find ways to manage your stress. You could try some of the following: Practice relaxation techniques. Breathe slowly and deeply, in through your nose and out through your mouth. Listen to music. Soak in a bath or take a shower. Imagine a peaceful place or vacation. Get some support. Talk with family or friends about your stress. Join a support group. Talk with a counselor or therapist. Get some physical activity. Go for a walk, run, or bike ride. Play a favorite sport. Practice yoga.  Medicines Talk with your health care provider about medicines that might help you deal with cravings and make quitting easier for you. Relationships Social situations can be difficult when you are quitting smoking. To manage this, you can: Avoid parties and other social situations where people might be smoking. Avoid alcohol. Leave right away if you have the urge to smoke. Explain to your family and friends that you are quitting smoking. Ask for support and let them know you might be a bit grumpy. Plan activities where smoking is not an option. General instructions Be aware that many people gain weight after they quit smoking. However, not everyone does. To keep from gaining weight, have a plan in place before you quit, and stick to the plan after you quit. Your plan should include: Eating healthy snacks. When you have a craving, it may help to: Eat popcorn, or try carrots, celery, or other cut vegetables. Chew sugar-free gum. Changing how you eat. Eat small  portion sizes at meals. Eat 4-6 small meals throughout the day instead of 1-2 large meals a day. Be mindful when you eat. You should avoid watching television or doing other things that might distract you as you eat. Exercising regularly. Make time to exercise each day. If you do not have time for a long workout, do short bouts of exercise for 5-10 minutes several times a day. Do some form of strengthening exercise, such as weight lifting. Do some exercise that gets your heart beating and causes you to breathe deeply, such as walking fast, running, swimming, or biking. This is very important. Drinking plenty of water or other low-calorie or no-calorie drinks. Drink enough fluid to keep your urine pale yellow.  How to recognize withdrawal symptoms Your body and mind may experience discomfort as you try to get used to not having nicotine in your system. These effects are called withdrawal symptoms. They may include: Feeling hungrier than normal. Having trouble concentrating. Feeling irritable or restless. Having trouble sleeping. Feeling depressed. Craving a cigarette. These symptoms may surprise you, but they  are normal to have when quitting smoking. To manage withdrawal symptoms: Avoid places, people, and activities that trigger your cravings. Remember why you want to quit. Get plenty of sleep. Avoid coffee and other drinks that contain caffeine. These may worsen some of your symptoms. How to manage cravings Come up with a plan for how to deal with your cravings. The plan should include the following: A definition of the specific situation you want to deal with. An activity or action you will take to replace smoking. A clear idea for how this action will help. The name of someone who could help you with this. Cravings usually last for 5-10 minutes. Consider taking the following actions to help you with your plan to deal with cravings: Keep your mouth busy. Chew sugar-free gum. Suck on  hard candies or a straw. Brush your teeth. Keep your hands and body busy. Change to a different activity right away. Squeeze or play with a ball. Do an activity or a hobby, such as making bead jewelry, practicing needlepoint, or working with wood. Mix up your normal routine. Take a short exercise break. Go for a quick walk, or run up and down stairs. Focus on doing something kind or helpful for someone else. Call a friend or family member to talk during a craving. Join a support group. Contact a quitline. Where to find support To get help or find a support group: Call the [facility]'s Smoking Quitline: 1-800-QUIT-NOW 509-215-9840) Text QUIT to SmokefreeTXTMQ:317211 Where to find more information Visit these websites to find more information on quitting smoking: U.S. Department of Health and Human Services: www.smokefree.gov American Lung Association: www.freedomfromsmoking.org Centers for Disease Control and Prevention (CDC): http://www.wolf.info/ American Heart Association: www.heart.org Contact a health care provider if: You want to change your plan for quitting. The medicines you are taking are not helping. Your eating feels out of control or you cannot sleep. You feel depressed or become very anxious. Summary Quitting smoking is a physical and mental challenge. You will face cravings, withdrawal symptoms, and temptation to smoke again. Preparation can help you as you go through these challenges. Try different techniques to manage stress, handle social situations, and prevent weight gain. You can deal with cravings by keeping your mouth busy (such as by chewing gum), keeping your hands and body busy, calling family or friends, or contacting a quitline for people who want to quit smoking. You can deal with withdrawal symptoms by avoiding places where people smoke, getting plenty of rest, and avoiding drinks that contain caffeine. This information is not intended to replace advice  given to you by your health care provider. Make sure you discuss any questions you have with your health care provider. Document Revised: 10/05/2021 Document Reviewed: 10/05/2021 Elsevier Patient Education  [place].

## 2023-01-17 ENCOUNTER — Telehealth: Payer: Self-pay | Admitting: Cardiovascular Disease

## 2023-01-17 NOTE — Telephone Encounter (Signed)
Spoke with patient and informed her according to her After Visit Summary she is start the aspirin EC 81 MG tablet and continue taking even on day of procedure. Patient understood with read back

## 2023-01-17 NOTE — Telephone Encounter (Signed)
Pt c/o medication issue:  1. Name of Medication:   aspirin EC 81 MG tablet    2. How are you currently taking this medication (dosage and times per day)?   Take 1 tablet (81 mg total) by mouth daily. Swallow whole.    3. Are you having a reaction (difficulty breathing--STAT)? No  4. What is your medication issue? Pt would like to know if she needs to wait and start mediation after scheduled procedure on 02/12/23. Please advise

## 2023-02-05 ENCOUNTER — Other Ambulatory Visit
Admission: RE | Admit: 2023-02-05 | Discharge: 2023-02-05 | Disposition: A | Discharge status: Home / Self Care | Payer: Medicare Other | Source: Ambulatory Visit | Attending: Cardiovascular Disease | Admitting: Cardiovascular Disease

## 2023-02-05 DIAGNOSIS — I739 Peripheral vascular disease, unspecified: Secondary | ICD-10-CM | POA: Diagnosis not present

## 2023-02-05 DIAGNOSIS — Z01818 Encounter for other preprocedural examination: Secondary | ICD-10-CM | POA: Diagnosis not present

## 2023-02-05 LAB — BASIC METABOLIC PANEL
Anion gap: 9 (ref 5–15)
BUN: 17 mg/dL (ref 8–23)
CO2: 28 mmol/L (ref 22–32)
Calcium: 9.6 mg/dL (ref 8.9–10.3)
Chloride: 105 mmol/L (ref 98–111)
Creatinine, Ser: 0.83 mg/dL (ref 0.44–1.00)
GFR, Estimated: >60 mL/min (ref >60)
Glucose, Bld: 113 mg/dL — ABNORMAL HIGH (ref 70–99)
Potassium: 3.4 mmol/L — ABNORMAL LOW (ref 3.5–5.1)
Sodium: 142 mmol/L (ref 135–145)

## 2023-02-05 LAB — CBC
HCT: 50.1 % — ABNORMAL HIGH (ref 36.0–46.0)
Hemoglobin: 16.3 g/dL — ABNORMAL HIGH (ref 12.0–15.0)
MCH: 30.5 pg (ref 26.0–34.0)
MCHC: 32.5 g/dL (ref 30.0–36.0)
MCV: 93.6 fL (ref 80.0–100.0)
Platelets: 212 10*3/uL (ref 150–400)
RBC: 5.35 MIL/uL — ABNORMAL HIGH (ref 3.87–5.11)
RDW: 13.7 % (ref 11.5–15.5)
WBC: 12.3 10*3/uL — ABNORMAL HIGH (ref 4.0–10.5)
nRBC: 0 % (ref 0.0–0.2)

## 2023-02-07 ENCOUNTER — Other Ambulatory Visit: Payer: Self-pay

## 2023-02-07 MED ORDER — POTASSIUM CHLORIDE ER 10 MEQ PO TBCR: 10.0000 meq | EXTENDED_RELEASE_TABLET | Freq: Every day | ORAL | 3 refills | Status: DC

## 2023-02-11 ENCOUNTER — Telehealth: Payer: Self-pay | Admitting: *Deleted

## 2023-02-11 NOTE — Telephone Encounter (Signed)
Abdominal aortogram scheduled at [facility] for: Wednesday February 12, 2023 10:30 AM Arrival time [facility] Main Entrance A at: 8:30 AM  Nothing to eat after midnight prior to procedure, clear liquids until 5 AM day of procedure.   Medication instructions: -Usual morning medications can be taken with sips of water including aspirin 81 mg and KCl supplement.  Confirmed patient has responsible adult to drive home post procedure and be with patient first 24 hours after arriving home.  Plan to go home the same day, you will only stay overnight if medically necessary.  Reviewed procedure instructions with patient.

## 2023-02-12 ENCOUNTER — Encounter (HOSPITAL_COMMUNITY)
Admission: RE | Disposition: A | Discharge status: Home / Self Care | Payer: Self-pay | Source: Home / Self Care | Attending: Cardiovascular Disease

## 2023-02-12 ENCOUNTER — Other Ambulatory Visit: Payer: Self-pay

## 2023-02-12 ENCOUNTER — Ambulatory Visit (HOSPITAL_COMMUNITY)
Admission: RE | Admit: 2023-02-12 | Discharge: 2023-02-12 | Disposition: A | Discharge status: Home / Self Care | Payer: Medicare Other | Attending: Cardiovascular Disease | Admitting: Cardiovascular Disease

## 2023-02-12 DIAGNOSIS — J449 Chronic obstructive pulmonary disease, unspecified: Secondary | ICD-10-CM | POA: Diagnosis not present

## 2023-02-12 DIAGNOSIS — G8929 Other chronic pain: Secondary | ICD-10-CM | POA: Insufficient documentation

## 2023-02-12 DIAGNOSIS — E782 Mixed hyperlipidemia: Secondary | ICD-10-CM

## 2023-02-12 DIAGNOSIS — F17210 Nicotine dependence, cigarettes, uncomplicated: Secondary | ICD-10-CM | POA: Diagnosis not present

## 2023-02-12 DIAGNOSIS — I739 Peripheral vascular disease, unspecified: Secondary | ICD-10-CM | POA: Diagnosis present

## 2023-02-12 DIAGNOSIS — I70213 Atherosclerosis of native arteries of extremities with intermittent claudication, bilateral legs: Secondary | ICD-10-CM | POA: Diagnosis not present

## 2023-02-12 DIAGNOSIS — I70211 Atherosclerosis of native arteries of extremities with intermittent claudication, right leg: Secondary | ICD-10-CM | POA: Diagnosis not present

## 2023-02-12 DIAGNOSIS — E785 Hyperlipidemia, unspecified: Secondary | ICD-10-CM | POA: Diagnosis not present

## 2023-02-12 HISTORY — PX: ABDOMINAL AORTOGRAM W/LOWER EXTREMITY: CATH118223

## 2023-02-12 LAB — BASIC METABOLIC PANEL
Anion gap: 10 (ref 5–15)
BUN: 11 mg/dL (ref 8–23)
CO2: 26 mmol/L (ref 22–32)
Calcium: 9.6 mg/dL (ref 8.9–10.3)
Chloride: 103 mmol/L (ref 98–111)
Creatinine, Ser: 0.84 mg/dL (ref 0.44–1.00)
GFR, Estimated: >60 mL/min (ref >60)
Glucose, Bld: 107 mg/dL — ABNORMAL HIGH (ref 70–99)
Potassium: 3.8 mmol/L (ref 3.5–5.1)
Sodium: 139 mmol/L (ref 135–145)

## 2023-02-12 SURGERY — ABDOMINAL AORTOGRAM W/LOWER EXTREMITY
Anesthesia: LOCAL

## 2023-02-12 MED ORDER — SODIUM CHLORIDE 0.9% FLUSH: 3.0000 mL | Freq: Two times a day (BID) | INTRAVENOUS | Status: DC

## 2023-02-12 MED ORDER — MIDAZOLAM HCL 2 MG/2ML IJ SOLN
INTRAMUSCULAR | Status: DC | PRN
  Administered 2023-02-12: 1 mg via INTRAVENOUS

## 2023-02-12 MED ORDER — MIDAZOLAM HCL 2 MG/2ML IJ SOLN
INTRAMUSCULAR | Status: AC
  Filled 2023-02-12: qty 2

## 2023-02-12 MED ORDER — ACETAMINOPHEN 325 MG PO TABS: 650.0000 mg | ORAL_TABLET | ORAL | Status: DC | PRN

## 2023-02-12 MED ORDER — HEPARIN (PORCINE) IN NACL 1000-0.9 UT/500ML-% IV SOLN
INTRAVENOUS | Status: DC | PRN
  Administered 2023-02-12 (×2): 500 mL

## 2023-02-12 MED ORDER — SODIUM CHLORIDE 0.9 % IV SOLN: 250.0000 mL | INTRAVENOUS | Status: DC | PRN

## 2023-02-12 MED ORDER — HEPARIN SODIUM (PORCINE) 1000 UNIT/ML IJ SOLN
INTRAMUSCULAR | Status: DC | PRN
  Administered 2023-02-12: 2000 [IU] via INTRAVENOUS

## 2023-02-12 MED ORDER — SODIUM CHLORIDE 0.9% FLUSH: 3.0000 mL | INTRAVENOUS | Status: DC | PRN

## 2023-02-12 MED ORDER — FENTANYL CITRATE (PF) 100 MCG/2ML IJ SOLN
INTRAMUSCULAR | Status: AC
  Filled 2023-02-12: qty 2

## 2023-02-12 MED ORDER — SODIUM CHLORIDE 0.9 % IV SOLN: INTRAVENOUS | Status: DC

## 2023-02-12 MED ORDER — LIDOCAINE HCL (PF) 1 % IJ SOLN
INTRAMUSCULAR | Status: AC
  Filled 2023-02-12: qty 30

## 2023-02-12 MED ORDER — ASPIRIN 81 MG PO CHEW: 81.0000 mg | CHEWABLE_TABLET | ORAL | Status: DC

## 2023-02-12 MED ORDER — VERAPAMIL HCL 2.5 MG/ML IV SOLN: INTRAVENOUS | Status: DC | PRN

## 2023-02-12 MED ORDER — LIDOCAINE HCL (PF) 1 % IJ SOLN
INTRAMUSCULAR | Status: DC | PRN
  Administered 2023-02-12: 15 mL

## 2023-02-12 MED ORDER — FENTANYL CITRATE (PF) 100 MCG/2ML IJ SOLN
INTRAMUSCULAR | Status: DC | PRN
  Administered 2023-02-12: 50 ug via INTRAVENOUS

## 2023-02-12 MED ORDER — VERAPAMIL HCL 2.5 MG/ML IV SOLN
INTRAVENOUS | Status: AC
  Filled 2023-02-12: qty 2

## 2023-02-12 MED ORDER — SODIUM CHLORIDE 0.9 % WEIGHT BASED INFUSION
3.0000 mL/kg/h | INTRAVENOUS | Status: AC
  Administered 2023-02-12: 3 mL/kg/h via INTRAVENOUS

## 2023-02-12 MED ORDER — ONDANSETRON HCL 4 MG/2ML IJ SOLN: 4.0000 mg | Freq: Four times a day (QID) | INTRAMUSCULAR | Status: DC | PRN

## 2023-02-12 MED ORDER — SODIUM CHLORIDE 0.9 % WEIGHT BASED INFUSION
1.0000 mL/kg/h | INTRAVENOUS | Status: DC
  Administered 2023-02-12: 1 mL/kg/h via INTRAVENOUS

## 2023-02-12 MED ORDER — IODIXANOL 320 MG/ML IV SOLN
INTRAVENOUS | Status: DC | PRN
  Administered 2023-02-12: 85 mL via INTRA_ARTERIAL

## 2023-02-12 SURGICAL SUPPLY — 15 items
CATH ANGIO 5F BER2 65CM (CATHETERS) IMPLANT
CATH ANGIO 5F PIGTAIL 65CM (CATHETERS) IMPLANT
CATH INFINITI 5 FR STR PIGTAIL (CATHETERS) IMPLANT
DEVICE RAD TR BAND REGULAR (VASCULAR PRODUCTS) IMPLANT
GLIDESHEATH SLEND SS 6F .021 (SHEATH) IMPLANT
KIT MICROPUNCTURE NIT STIFF (SHEATH) IMPLANT
KIT PV (KITS) ×1 IMPLANT
SHEATH PINNACLE 5F 10CM (SHEATH) IMPLANT
SHEATH PROBE COVER 6X72 (BAG) IMPLANT
STOPCOCK MORSE 400PSI 3WAY (MISCELLANEOUS) IMPLANT
SYR MEDRAD MARK 7 150ML (SYRINGE) ×1 IMPLANT
TRANSDUCER W/STOPCOCK (MISCELLANEOUS) ×1 IMPLANT
TRAY PV CATH (CUSTOM PROCEDURE TRAY) ×1 IMPLANT
TUBING CIL FLEX 10 FLL-RA (TUBING) IMPLANT
WIRE HITORQ VERSACORE ST 145CM (WIRE) IMPLANT

## 2023-02-12 NOTE — Discharge Instructions (Signed)

## 2023-02-12 NOTE — Progress Notes (Signed)
Patient and daughter was given discharge instructions. Both verbalized understanding. 

## 2023-02-12 NOTE — Interval H&P Note (Signed)
History and Physical Interval Note:  02/12/2023 11:14 AM  Pamela Haley  has presented today for surgery, with the diagnosis of pad.  The various methods of treatment have been discussed with the patient and family. After consideration of risks, benefits and other options for treatment, the patient has consented to  Procedure(s): ABDOMINAL AORTOGRAM W/LOWER EXTREMITY (N/A) as a surgical intervention.  The patient's history has been reviewed, patient examined, no change in status, stable for surgery.  I have reviewed the patient's chart and labs.  Questions were answered to the patient's satisfaction.     Lorine Bears

## 2023-02-13 ENCOUNTER — Encounter (HOSPITAL_COMMUNITY): Payer: Self-pay | Admitting: Cardiovascular Disease

## 2023-02-19 DIAGNOSIS — M4316 Spondylolisthesis, lumbar region: Secondary | ICD-10-CM | POA: Diagnosis not present

## 2023-02-19 DIAGNOSIS — M47816 Spondylosis without myelopathy or radiculopathy, lumbar region: Secondary | ICD-10-CM | POA: Diagnosis not present

## 2023-03-03 ENCOUNTER — Other Ambulatory Visit: Payer: Self-pay

## 2023-03-03 DIAGNOSIS — I700 Atherosclerosis of aorta: Secondary | ICD-10-CM

## 2023-03-04 ENCOUNTER — Ambulatory Visit: Payer: Medicare Other | Attending: Cardiovascular Disease | Admitting: Cardiovascular Disease

## 2023-03-04 ENCOUNTER — Encounter: Payer: Self-pay | Admitting: Cardiovascular Disease

## 2023-03-04 VITALS — BP 130/74 | HR 98 | Ht 64.0 in | Wt 102.2 lb

## 2023-03-04 DIAGNOSIS — R9431 Abnormal electrocardiogram [ECG] [EKG]: Secondary | ICD-10-CM | POA: Insufficient documentation

## 2023-03-04 DIAGNOSIS — E785 Hyperlipidemia, unspecified: Secondary | ICD-10-CM | POA: Diagnosis not present

## 2023-03-04 DIAGNOSIS — R0609 Other forms of dyspnea: Secondary | ICD-10-CM

## 2023-03-04 DIAGNOSIS — I739 Peripheral vascular disease, unspecified: Secondary | ICD-10-CM | POA: Insufficient documentation

## 2023-03-04 DIAGNOSIS — Z720 Tobacco use: Secondary | ICD-10-CM | POA: Insufficient documentation

## 2023-03-04 NOTE — Patient Instructions (Addendum)
Medication Instructions:  No changes *If you need a refill on your cardiac medications before your next appointment, please call your pharmacy*   Lab Work: None ordered If you have labs (blood work) drawn today and your tests are completely normal, you will receive your results only by: MyChart Message (if you have MyChart) OR A paper copy in the mail If you have any lab test that is abnormal or we need to change your treatment, we will call you to review the results.   Testing/Procedures: Your provider has ordered a Lexiscan Myoview Stress test. This will take place at [facility]. Please report to the [facility] medical mall entrance. The volunteers at the first desk will direct you where to go.  [facility] MYOVIEW  Your provider has ordered a Stress Test with nuclear imaging. The purpose of this test is to evaluate the blood supply to your heart muscle. This procedure is referred to as a "Non-Invasive Stress Test." This is because other than having an IV started in your vein, nothing is inserted or "invades" your body. Cardiac stress tests are done to find areas of poor blood flow to the heart by determining the extent of coronary artery disease (CAD). Some patients exercise on a treadmill, which naturally increases the blood flow to your heart, while others who are unable to walk on a treadmill due to physical limitations will have a pharmacologic/chemical stress agent called Lexiscan . This medicine will mimic walking on a treadmill by temporarily increasing your coronary blood flow.   Please note: these test may take anywhere between 2-4 hours to complete  How to prepare for your Myoview test:  Nothing to eat for 6 hours prior to the test No caffeine for 24 hours prior to test No smoking 24 hours prior to test. Your medication may be taken with water.  If your doctor stopped a medication because of this test, do not take that medication. Ladies, please do not wear dresses.  Skirts or pants are  appropriate. Please wear a short sleeve shirt. No perfume, cologne or lotion. Wear comfortable walking shoes. No heels!   PLEASE NOTIFY THE OFFICE AT LEAST 24 HOURS IN ADVANCE IF YOU ARE UNABLE TO KEEP YOUR APPOINTMENT.  [phone] AND  PLEASE NOTIFY NUCLEAR MEDICINE AT [facility] AT LEAST 24 HOURS IN ADVANCE IF YOU ARE UNABLE TO KEEP YOUR APPOINTMENT. [phone]    Follow-Up: At [facility], you and your health needs are our priority.  As part of our continuing mission to provide you with exceptional heart care, we have created designated Provider Care Teams.  These Care Teams include your primary Cardiologist (physician) and Advanced Practice Providers (APPs -  Physician Assistants and Nurse Practitioners) who all work together to provide you with the care you need, when you need it.  We recommend signing up for the patient portal called "MyChart".  Sign up information is provided on this After Visit Summary.  MyChart is used to connect with patients for Virtual Visits (Telemedicine).  Patients are able to view lab/test results, encounter notes, upcoming appointments, etc.  Non-urgent messages can be sent to your provider as well.   To learn more about what you can do with MyChart, go to [email or web address].    Your next appointment:   4 month(s)  Provider:   You may see Lorine Bears, MD or one of the following Advanced Practice Providers on your designated Care Team:   Nicolasa Ducking, NP Eula Listen, PA-C Cadence Fransico Michael, PA-C Charlsie Quest, NP

## 2023-03-04 NOTE — Progress Notes (Unsigned)
Cardiology Office Note   Date:  03/04/2023   ID:  Pamela Haley, DOB April 25, 1951, MRN [id number]  PCP:  Lorre Munroe, NP  Cardiologist:   Lorine Bears, MD   Chief Complaint  Patient presents with   Follow-up    7-8 wk f/u no complaints today. Meds reviewed verbally with pt.      History of Present Illness: Pamela Haley is a 72 y.o. female who is here today for follow-up visit regarding peripheral arterial disease.  She was seen recently for preoperative cardiovascular evaluation before lumbar spine surgery after she was found to have an abnormal EKG suggestive of a prior infarct.   She has known history of extensive tobacco use, COPD, hyperlipidemia and chronic back pain.  She smoked 1 pack/day for 40 years and quit smoking last week.  She suffers from low back pain at rest and with exertion.  In addition, her feet are always cold.  She was found to have very diminished femoral pulses. She had an echocardiogram done which showed normal LV systolic function with no significant valvular abnormalities.  Lower extremity arterial Doppler was done and showed severely reduced ABI bilaterally with evidence of severe bilateral common iliac artery disease with systolic velocities greater than 500.  No significant infrainguinal disease.  She underwent lumbar spine surgery in January 2024 with improvement in low back pain.  However, she continued to have significant bilateral hip discomfort with exertion.  I proceeded with angiography last month.  Initially I obtained access via the right common femoral artery but her right common iliac artery was noted to be occluded into the distal aorta.  The catheter could not be advanced and thus I switched to a right radial access and performed angiography which showed heavily calcified and occluded distal aorta into the iliac arteries with reconstitution via collaterals from the mesenteric arteries.  In addition, there was significant right common  femoral artery stenosis and mild left common femoral artery stenosis.  She was referred to Dr. Karin Lieu for evaluation of aortobifemoral bypass.  She is scheduled for CTA.  She reports that her symptoms are stable.  She does admit that over the years she cut down on her physical activities due to leg symptoms.  In addition, she has cold sensation in her feet with numbness at night when she is trying to sleep.  She denies chest pain but continues to have exertional dyspnea.   Past Medical History:  Diagnosis Date   Abnormal EKG    HX OF PREVIOUS EKGS SHOWING POSSIBLE MI--BUT PT HAS NEVER HAD ANY HEART PROBLEMS--HAD BACK SURGERY AUG 2012 AT [facility] - NO HEART PROBLEMS   Chronic cough    PT IS A SMOKER   DDD (degenerative disc disease), lumbar    PAIN IN LOWER BACK AND DOWN RT HIP AND LEG--NUMBNESS, THROBBING, BURNING   Hyperthyroidism 02/20/2015    Past Surgical History:  Procedure Laterality Date   ABDOMINAL AORTOGRAM W/LOWER EXTREMITY N/A 02/12/2023   Procedure: ABDOMINAL AORTOGRAM W/LOWER EXTREMITY;  Surgeon: Iran Ouch, MD;  Location: [facility] INVASIVE CV LAB;  Service: Cardiovascular;  Laterality: N/A;   BACK SURGERY  05/2011   lumbar surgery at [facility]   BILATERAL CARPAL TUNNEL RELEASE     C SECTIONS X 3       Current Outpatient Medications  Medication Sig Dispense Refill   albuterol (VENTOLIN HFA) 108 (90 Base) MCG/ACT inhaler Inhale 1-2 puffs into the lungs every 6 (six) hours as needed for wheezing  or shortness of breath. 18 g 2   Ascorbic Acid (VITAMIN C PO) Take 1,000 mg by mouth daily.     aspirin EC 81 MG tablet Take 1 tablet (81 mg total) by mouth daily. Swallow whole. 90 tablet 3   B Complex-C (B-COMPLEX WITH VITAMIN C) tablet Take 1 tablet by mouth daily.     Budeson-Glycopyrrol-Formoterol (BREZTRI AEROSPHERE) 160-9-4.8 MCG/ACT AERO Inhale 2 puffs into the lungs in the morning and at bedtime. 10.7 g 11   COLLAGEN-VITAMIN C-BIOTIN PO Take 1 tablet by mouth daily.      cyclobenzaprine (FLEXERIL) 10 MG tablet Take 10 mg by mouth 2 (two) times daily as needed for muscle spasms.     Gelatin 600 MG CAPS Take 3 capsules by mouth daily.     ibuprofen (ADVIL) 800 MG tablet Take 800 mg by mouth 2 (two) times daily as needed for moderate pain or mild pain.     oxyCODONE-acetaminophen (PERCOCET/ROXICET) 5-325 MG tablet Take 1 tablet by mouth every 8 (eight) hours as needed for severe pain.     potassium chloride (KLOR-CON) 10 MEQ tablet Take 1 tablet (10 mEq total) by mouth daily. 90 tablet 3   rosuvastatin (CRESTOR) 10 MG tablet Take 1 tablet (10 mg total) by mouth daily. 90 tablet 3   Specialty Vitamins Products (BIOTIN PLUS KERATIN) 10000-100 MCG-MG TABS Take 1 tablet by mouth daily.     VITAMIN D-VITAMIN K PO Take 1 tablet by mouth daily. 125 mcg/ 90 mcg     No current facility-administered medications for this visit.    Allergies:   Patient has no known allergies.    Social History:  The patient  reports that she quit smoking about a year ago. Her smoking use included cigarettes. She has a 7.50 pack-year smoking history. She has never used smokeless tobacco. She reports current alcohol use. She reports that she does not use drugs.   Family History:  The patient's family history includes Diabetes in her maternal grandmother; Heart attack in her father; Heart disease in her father and mother; Stroke in her maternal grandmother.    ROS:  Please see the history of present illness.   Otherwise, review of systems are positive for none.   All other systems are reviewed and negative.    PHYSICAL EXAM: VS:  BP 130/74 (BP Location: Left Arm, Patient Position: Sitting, Cuff Size: Normal)   Pulse 98   Ht 5\' 4"  (1.626 m)   Wt 102 lb 4 oz (46.4 kg)   SpO2 94%   BMI 17.55 kg/m  , BMI Body mass index is 17.55 kg/m. GEN: Well nourished, well developed, in no acute distress  HEENT: normal  Neck: no JVD, carotid bruits, or masses Cardiac: RRR; no murmurs, rubs, or  gallops,no edema  Respiratory:  clear to auscultation bilaterally, normal work of breathing GI: soft, nontender, nondistended, + BS MS: no deformity or atrophy  Skin: warm and dry, no rash Neuro:  Strength and sensation are intact Psych: euthymic mood, full affect    EKG:  EKG is ordered today. EKG showed sinus rhythm with PACs and possible old septal infarct.   Recent Labs: 10/09/2022: ALT 11; TSH 0.359 02/05/2023: Hemoglobin 16.3; Platelets 212 02/12/2023: BUN 11; Creatinine, Ser 0.84; Potassium 3.8; Sodium 139    Lipid Panel    Component Value Date/Time   CHOL 233 (H) 07/31/2021 1024   TRIG 148 07/31/2021 1024   HDL 44 07/31/2021 1024   CHOLHDL 5.3 (H) 07/31/2021 1024  LDLCALC 162 (H) 07/31/2021 1024      Wt Readings from Last 3 Encounters:  03/04/23 102 lb 4 oz (46.4 kg)  02/12/23 103 lb (46.7 kg)  01/14/23 103 lb 2 oz (46.8 kg)           No data to display            ASSESSMENT AND PLAN:  1.  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  The best option for revascularization is aortobifemoral bypass with right common femoral artery endarterectomy.  The patient prefers to wait on having the surgery done especially that she had back surgery at the end of January.   2.  Hyperlipidemia: Lipid profile in 2022 showed an LDL of 162.  She is now tolerating rosuvastatin 10 mg once daily.  3.  Tobacco use: I discussed with her the importance of smoking cessation.  4.  Exertional dyspnea with abnormal EKG.  She is at high risk for obstructive coronary artery disease considering how extensive her PAD is.  I requested a Lexiscan Myoview for evaluation.    Disposition: Follow-up in 4 months.  Signed,  Lorine Bears, MD  03/04/2023 4:17 PM      [facility]

## 2023-03-05 DIAGNOSIS — M461 Sacroiliitis, not elsewhere classified: Secondary | ICD-10-CM | POA: Diagnosis not present

## 2023-03-05 DIAGNOSIS — M4326 Fusion of spine, lumbar region: Secondary | ICD-10-CM | POA: Diagnosis not present

## 2023-03-06 ENCOUNTER — Ambulatory Visit
Admission: RE | Admit: 2023-03-06 | Discharge: 2023-03-06 | Disposition: A | Discharge status: Home / Self Care | Payer: Medicare Other | Source: Ambulatory Visit | Attending: Vascular Surgery | Admitting: Vascular Surgery

## 2023-03-06 DIAGNOSIS — I700 Atherosclerosis of aorta: Secondary | ICD-10-CM | POA: Diagnosis not present

## 2023-03-06 DIAGNOSIS — I7409 Other arterial embolism and thrombosis of abdominal aorta: Secondary | ICD-10-CM | POA: Diagnosis not present

## 2023-03-06 MED ORDER — IOHEXOL 300 MG/ML  SOLN: 150.0000 mL | Freq: Once | INTRAMUSCULAR | Status: DC | PRN

## 2023-03-06 MED ORDER — IOHEXOL 350 MG/ML SOLN
150.0000 mL | Freq: Once | INTRAVENOUS | Status: AC | PRN
  Administered 2023-03-06: 125 mL via INTRAVENOUS

## 2023-03-06 NOTE — Progress Notes (Signed)
Office Note     CC: Occluded aorta Requesting Provider:  Lorre Munroe, NP  HPI: Pamela Haley is a 73 y.o. (1951/07/20) female presenting at the request of Dr. Kirke Corin with claudication and imaging demonstrating occluded aorta, right iliac system.  On exam, Pamela Haley was doing well.  A native of [place], she lived in the [place] area prior to moving to [place] years ago.  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  Pamela Haley currently lives independently, and helps take care of her 1-year-old grandson.  Her daughter also lives in the home as she is currently going through a divorce.  Pamela Haley notes moderate to severe claudication symptoms bilaterally with pain beginning in the calves and involving the thighs after walking roughly 1-2 blocks.  She denies rest pain, denies tissue loss.  She continues to smoke, but is working on backing down.  And also has a history of back surgery which has typically improved her pain level.  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   Past Medical History:  Diagnosis Date   Abnormal EKG    HX OF PREVIOUS EKGS SHOWING POSSIBLE MI--BUT PT HAS NEVER HAD ANY HEART PROBLEMS--HAD BACK SURGERY AUG 2012 AT [facility] - NO HEART PROBLEMS   Chronic cough    PT IS A SMOKER   DDD (degenerative disc disease), lumbar    PAIN IN LOWER BACK AND DOWN RT HIP AND LEG--NUMBNESS, THROBBING, BURNING   Hyperthyroidism 02/20/2015    Past Surgical History:  Procedure Laterality Date   ABDOMINAL AORTOGRAM W/LOWER EXTREMITY N/A 02/12/2023   Procedure: ABDOMINAL AORTOGRAM W/LOWER EXTREMITY;  Surgeon: Iran Ouch, MD;  Location: [facility] INVASIVE CV LAB;  Service: Cardiovascular;  Laterality: N/A;   BACK SURGERY  05/2011   lumbar surgery at [facility]   BILATERAL CARPAL TUNNEL RELEASE     C SECTIONS X 3      Social History   Socioeconomic History   Marital status: Single    Spouse name: Not on file   Number of  children: Not on file   Years of education: Not on file   Highest education level: Not on file  Occupational History   Not on file  Tobacco Use   Smoking status: Former    Packs/day: 0.25    Years: 30.00    Additional pack years: 0.00    Total pack years: 7.50    Types: Cigarettes    Quit date: 03/13/2022    Years since quitting: 0.9   Smokeless tobacco: Never   Tobacco comments:    Has been smoking off and on. Right now she is not smoking. 09/13/2022  Vaping Use   Vaping Use: Never used  Substance and Sexual Activity   Alcohol use: Yes    Alcohol/week: 0.0 standard drinks of alcohol    Comment: rare   Drug use: No   Sexual activity: Not Currently  Other Topics Concern   Not on file  Social History Narrative   Not on file   Social Determinants of Haley   Financial Resource Strain: Low Risk  (12/27/2022)   Overall Financial Resource Strain (CARDIA)    Difficulty of Paying Living Expenses: Not very hard  Food Insecurity: No Food Insecurity (12/27/2022)   Hunger Vital Sign    Worried About Running Out of Food in the Last Year: Never true    Ran Out of Food in the Last Year: Never true  Transportation Needs: No Transportation  Needs (12/27/2022)   PRAPARE - Administrator, Civil Service (Medical): No    Lack of Transportation (Non-Medical): No  Physical Activity: Insufficiently Active (12/27/2022)   Exercise Vital Sign    Days of Exercise per Week: 3 days    Minutes of Exercise per Session: 30 min  Stress: No Stress Concern Present (12/27/2022)   Pamela Haley - Occupational Stress Questionnaire    Feeling of Stress : Not at all  Social Connections: Socially Isolated (12/27/2022)   Social Connection and Isolation Panel [NHANES]    Frequency of Communication with Friends and Family: Three times a week    Frequency of Social Gatherings with Friends and Family: Never    Attends Religious Services: Never    Database administrator or  Organizations: No    Attends Banker Meetings: Never    Marital Status: Divorced  Catering manager Violence: Not At Risk (12/27/2022)   Humiliation, Afraid, Rape, and Kick questionnaire    Fear of Current or Ex-Partner: No    Emotionally Abused: No    Physically Abused: No    Sexually Abused: No   Family History  Problem Relation Age of Onset   Heart disease Mother    Heart attack Father    Heart disease Father    Stroke Maternal Grandmother    Diabetes Maternal Grandmother     Current Outpatient Medications  Medication Sig Dispense Refill   albuterol (VENTOLIN HFA) 108 (90 Base) MCG/ACT inhaler Inhale 1-2 puffs into the lungs every 6 (six) hours as needed for wheezing or shortness of breath. 18 g 2   Ascorbic Acid (VITAMIN C PO) Take 1,000 mg by mouth daily.     aspirin EC 81 MG tablet Take 1 tablet (81 mg total) by mouth daily. Swallow whole. 90 tablet 3   B Complex-C (B-COMPLEX WITH VITAMIN C) tablet Take 1 tablet by mouth daily.     Budeson-Glycopyrrol-Formoterol (BREZTRI AEROSPHERE) 160-9-4.8 MCG/ACT AERO Inhale 2 puffs into the lungs in the morning and at bedtime. 10.7 g 11   COLLAGEN-VITAMIN C-BIOTIN PO Take 1 tablet by mouth daily.     cyclobenzaprine (FLEXERIL) 10 MG tablet Take 10 mg by mouth 2 (two) times daily as needed for muscle spasms.     Gelatin 600 MG CAPS Take 3 capsules by mouth daily.     ibuprofen (ADVIL) 800 MG tablet Take 800 mg by mouth 2 (two) times daily as needed for moderate pain or mild pain.     oxyCODONE-acetaminophen (PERCOCET/ROXICET) 5-325 MG tablet Take 1 tablet by mouth every 8 (eight) hours as needed for severe pain.     potassium chloride (KLOR-CON) 10 MEQ tablet Take 1 tablet (10 mEq total) by mouth daily. 90 tablet 3   rosuvastatin (CRESTOR) 10 MG tablet Take 1 tablet (10 mg total) by mouth daily. 90 tablet 3   Specialty Vitamins Products (BIOTIN PLUS KERATIN) 10000-100 MCG-MG TABS Take 1 tablet by mouth daily.     VITAMIN  D-VITAMIN K PO Take 1 tablet by mouth daily. 125 mcg/ 90 mcg     No current facility-administered medications for this visit.    No Known Allergies   REVIEW OF SYSTEMS:  [X]  denotes positive finding, [ ]  denotes negative finding Cardiac  Comments:  Chest pain or chest pressure:    Shortness of breath upon exertion:    Short of breath when lying flat:    Irregular heart rhythm:  Vascular    Pain in calf, thigh, or hip brought on by ambulation:    Pain in feet at night that wakes you up from your sleep:     Blood clot in your veins:    Leg swelling:         Pulmonary    Oxygen at home:    Productive cough:     Wheezing:         Neurologic    Sudden weakness in arms or legs:     Sudden numbness in arms or legs:     Sudden onset of difficulty speaking or slurred speech:    Temporary loss of vision in one eye:     Problems with dizziness:         Gastrointestinal    Blood in stool:     Vomited blood:         Genitourinary    Burning when urinating:     Blood in urine:        Psychiatric    Major depression:         Hematologic    Bleeding problems:    Problems with blood clotting too easily:        Skin    Rashes or ulcers:        Constitutional    Fever or chills:      PHYSICAL EXAMINATION:  There were no vitals filed for this visit.  General:  WDWN in NAD; vital signs documented above Gait: Not observed HENT: WNL, normocephalic Pulmonary: normal non-labored breathing , without wheezing Cardiac: regular HR Abdomen: soft, NT, no masses Skin: without rashes Vascular Exam/Pulses:  Right Left  Radial 2+ (normal) 2+ (normal)  Ulnar 2+ (normal) 2+ (normal)  Femoral absent absent  Popliteal    DP absent absent  PT     Extremities: without ischemic changes, without Gangrene , without cellulitis; without open wounds;  Musculoskeletal: no muscle wasting or atrophy  Neurologic: A&O X 3;  No focal weakness or paresthesias are detected Psychiatric:   The pt has Normal affect.   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 female presenting with occluded distal aorta, right-sided iliac system with symptoms of moderate to severe claudication.   Pamela Haley and I had a long discussion regarding the above.  Namely, my question was whether her symptoms were lifestyle limiting.  At this time, they are not.  We discussed that Jakea would need an aortobifemoral bypass to optimize flow to the lower extremities due to the distal aortic right-sided iliac artery occlusions.  We discussed the risks and benefits of surgery, and I was honest about the historical mortality rate being roughly 5%.  Being that Devra's symptoms are not lifestyle limiting, we discussed the importance of smoking cessation a walking program.  Ordered Pletal in an effort to improve her symptoms.  Whittley is aware that aortobifemoral bypass surgery is reserved for individuals severe lifestyle limiting claudication, rest pain, or tissue loss.  Was honest with her that I try to reserve this operation for individuals with rest pain and tissue loss only as it is a major undertaking that has a significant risk profile.  Alainnah was very appreciative of our conversation, and after discussing the risks and benefits felt comfortable holding on open surgical intervention.  My plan is to see her in 6 months time to assess her symptoms.  I gave her my card and asked her to call immediately should new symptoms arise or worsen as I am happy to take care of her.    Victorino Sparrow, MD [facility] [phone]

## 2023-03-07 ENCOUNTER — Ambulatory Visit (INDEPENDENT_AMBULATORY_CARE_PROVIDER_SITE_OTHER): Payer: Medicare Other | Admitting: Vascular Surgery

## 2023-03-07 ENCOUNTER — Encounter: Payer: Self-pay | Admitting: Vascular Surgery

## 2023-03-07 VITALS — BP 138/86 | HR 103 | Temp 98.5°F | Resp 20 | Ht 64.0 in | Wt 100.0 lb

## 2023-03-07 DIAGNOSIS — I70213 Atherosclerosis of native arteries of extremities with intermittent claudication, bilateral legs: Secondary | ICD-10-CM | POA: Diagnosis not present

## 2023-03-07 DIAGNOSIS — I700 Atherosclerosis of aorta: Secondary | ICD-10-CM

## 2023-03-07 MED ORDER — CILOSTAZOL 100 MG PO TABS
100.0000 mg | ORAL_TABLET | Freq: Two times a day (BID) | ORAL | 2 refills | Status: DC
Start: 2023-03-07 — End: 2023-06-11

## 2023-04-01 ENCOUNTER — Ambulatory Visit
Admission: RE | Admit: 2023-04-01 | Discharge: 2023-04-01 | Disposition: A | Discharge status: Home / Self Care | Payer: Medicare Other | Source: Ambulatory Visit | Attending: Cardiovascular Disease | Admitting: Cardiovascular Disease

## 2023-04-01 DIAGNOSIS — R0609 Other forms of dyspnea: Secondary | ICD-10-CM | POA: Diagnosis not present

## 2023-04-01 LAB — NM MYOCAR MULTI W/SPECT W/WALL MOTION / EF
Estimated workload: 1
Exercise duration (min): 0 min
Exercise duration (sec): 0 s
LV dias vol: 11 mL (ref 46–106)
LV sys vol: 3 mL
MPHR: 149 {beats}/min
Nuc Stress EF: 73 %
Peak HR: 120 {beats}/min
Percent HR: 80 %
Rest HR: 102 {beats}/min
Rest Nuclear Isotope Dose: 10.6 mCi
SDS: 1
SRS: 4
SSS: 3
ST Depression (mm): 0 mm
Stress Nuclear Isotope Dose: 32.5 mCi
TID: 0.92

## 2023-04-01 MED ORDER — REGADENOSON 0.4 MG/5ML IV SOLN
0.4000 mg | Freq: Once | INTRAVENOUS | Status: AC
  Administered 2023-04-01: 0.4 mg via INTRAVENOUS

## 2023-04-01 MED ORDER — TECHNETIUM TC 99M TETROFOSMIN IV KIT
32.5100 | PACK | Freq: Once | INTRAVENOUS | Status: AC | PRN
  Administered 2023-04-01: 32.51 via INTRAVENOUS

## 2023-04-01 MED ORDER — TECHNETIUM TC 99M TETROFOSMIN IV KIT
10.0000 | PACK | Freq: Once | INTRAVENOUS | Status: AC | PRN
  Administered 2023-04-01: 10.57 via INTRAVENOUS

## 2023-05-13 DIAGNOSIS — Z681 Body mass index (BMI) 19 or less, adult: Secondary | ICD-10-CM | POA: Diagnosis not present

## 2023-05-13 DIAGNOSIS — M4326 Fusion of spine, lumbar region: Secondary | ICD-10-CM | POA: Diagnosis not present

## 2023-05-13 DIAGNOSIS — M4316 Spondylolisthesis, lumbar region: Secondary | ICD-10-CM | POA: Diagnosis not present

## 2023-05-13 DIAGNOSIS — M461 Sacroiliitis, not elsewhere classified: Secondary | ICD-10-CM | POA: Diagnosis not present

## 2023-06-04 ENCOUNTER — Ambulatory Visit: Payer: Medicare Other | Admitting: Vascular Surgery

## 2023-06-07 ENCOUNTER — Other Ambulatory Visit: Payer: Self-pay | Admitting: Vascular Surgery

## 2023-06-13 ENCOUNTER — Ambulatory Visit: Payer: Medicare Other | Admitting: Vascular Surgery

## 2023-06-19 ENCOUNTER — Other Ambulatory Visit: Payer: Self-pay

## 2023-06-19 MED ORDER — BREZTRI AEROSPHERE 160-9-4.8 MCG/ACT IN AERO: 2.0000 | INHALATION_SPRAY | Freq: Two times a day (BID) | RESPIRATORY_TRACT | 11 refills | Status: DC

## 2023-06-19 NOTE — Telephone Encounter (Signed)
Received fax from [facility] needing a prescription refill on her Breztri. I have sent in the refill.   Nothing further needed.

## 2023-07-03 DIAGNOSIS — Z681 Body mass index (BMI) 19 or less, adult: Secondary | ICD-10-CM | POA: Diagnosis not present

## 2023-07-03 DIAGNOSIS — M461 Sacroiliitis, not elsewhere classified: Secondary | ICD-10-CM | POA: Diagnosis not present

## 2023-07-03 DIAGNOSIS — M4326 Fusion of spine, lumbar region: Secondary | ICD-10-CM | POA: Diagnosis not present

## 2023-07-04 DIAGNOSIS — Z23 Encounter for immunization: Secondary | ICD-10-CM | POA: Diagnosis not present

## 2023-07-08 ENCOUNTER — Ambulatory Visit: Payer: Medicare Other | Attending: Cardiovascular Disease | Admitting: Cardiovascular Disease

## 2023-07-08 ENCOUNTER — Encounter: Payer: Self-pay | Admitting: Cardiovascular Disease

## 2023-07-08 VITALS — BP 138/60 | HR 122 | Ht 64.0 in | Wt 98.5 lb

## 2023-07-08 DIAGNOSIS — Z720 Tobacco use: Secondary | ICD-10-CM | POA: Insufficient documentation

## 2023-07-08 DIAGNOSIS — R000 Tachycardia, unspecified: Secondary | ICD-10-CM | POA: Diagnosis not present

## 2023-07-08 DIAGNOSIS — I739 Peripheral vascular disease, unspecified: Secondary | ICD-10-CM | POA: Insufficient documentation

## 2023-07-08 DIAGNOSIS — R0609 Other forms of dyspnea: Secondary | ICD-10-CM | POA: Diagnosis not present

## 2023-07-08 DIAGNOSIS — E782 Mixed hyperlipidemia: Secondary | ICD-10-CM | POA: Insufficient documentation

## 2023-07-08 NOTE — Progress Notes (Unsigned)
Cardiology Office Note   Date:  07/08/2023   ID:  Pamela Haley, DOB Sep 09, 1951, MRN [id number]  PCP:  Lorre Munroe, NP  Cardiologist:   Lorine Bears, MD   Chief Complaint  Patient presents with   Follow-up    F/u stress test no complaints today. Meds reviewed verbally with pt.      History of Present Illness: Pamela Haley is a 72 y.o. female who is here today for follow-up visit regarding peripheral arterial disease.  She has known history of extensive tobacco use, COPD, hyperlipidemia and chronic back pain.  She smoked 1 pack/day for 40 years and quit smoking last week.  She suffers from low back pain at rest and with exertion.  In addition, her feet are always cold.  She was found to have very diminished femoral pulses. Echocardiogram in January 2024 showed normal LV systolic function with no significant valvular abnormalities.  Lower extremity arterial Doppler in March 2024 showed severely reduced ABI bilaterally with evidence of severe bilateral common iliac artery disease with systolic velocities greater than 500.  No significant infrainguinal disease.  She underwent lumbar spine surgery in January 2024 with improvement in low back pain.  However, she continued to have significant bilateral hip discomfort with exertion.  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  She was referred to Dr. Karin Lieu for evaluation of aortobifemoral bypass.  She was prescribed cilostazol and reports some improvement in symptoms.  She cut down on tobacco use to 8 cigarettes a day and planning to quit completely next week.  She underwent a Lexiscan Myoview in June which showed no evidence of ischemia with normal ejection fraction.  She denies chest pain or worsening dyspnea.  She is tachycardic but denies palpitations.  She does have previous history of hyperthyroidism.  She needs to have sacroiliac fusion surgery.   Past Medical History:  Diagnosis Date    Abnormal EKG    HX OF PREVIOUS EKGS SHOWING POSSIBLE MI--BUT PT HAS NEVER HAD ANY HEART PROBLEMS--HAD BACK SURGERY AUG 2012 AT [facility] - NO HEART PROBLEMS   Chronic cough    PT IS A SMOKER   DDD (degenerative disc disease), lumbar    PAIN IN LOWER BACK AND DOWN RT HIP AND LEG--NUMBNESS, THROBBING, BURNING   Hyperthyroidism 02/20/2015    Past Surgical History:  Procedure Laterality Date   ABDOMINAL AORTOGRAM W/LOWER EXTREMITY N/A 02/12/2023   Procedure: ABDOMINAL AORTOGRAM W/LOWER EXTREMITY;  Surgeon: Iran Ouch, MD;  Location: [facility];  Service: Cardiovascular;  Laterality: N/A;   BACK SURGERY  05/2011   lumbar surgery at [facility]   BILATERAL CARPAL TUNNEL RELEASE     C SECTIONS X 3       Current Outpatient Medications  Medication Sig Dispense Refill   albuterol (VENTOLIN HFA) 108 (90 Base) MCG/ACT inhaler Inhale 1-2 puffs into the lungs every 6 (six) hours as needed for wheezing or shortness of breath. 18 g 2   Ascorbic Acid (VITAMIN C PO) Take 1,000 mg by mouth daily.     aspirin EC 81 MG tablet Take 1 tablet (81 mg total) by mouth daily. Swallow whole. 90 tablet 3   B Complex-C (B-COMPLEX WITH VITAMIN C) tablet Take 1 tablet by mouth daily.     Budeson-Glycopyrrol-Formoterol (BREZTRI AEROSPHERE) 160-9-4.8 MCG/ACT AERO Inhale 2 puffs into the lungs in the morning and at bedtime. 10.7 g 11   cilostazol (PLETAL) 100 MG tablet TAKE 1 TABLET BY MOUTH  TWICE DAILY BEFORE A MEAL 60 tablet 5   COLLAGEN-VITAMIN C-BIOTIN PO Take 1 tablet by mouth daily.     cyclobenzaprine (FLEXERIL) 10 MG tablet Take 10 mg by mouth 2 (two) times daily as needed for muscle spasms.     Gelatin 600 MG CAPS Take 3 capsules by mouth daily.     ibuprofen (ADVIL) 800 MG tablet Take 800 mg by mouth 2 (two) times daily as needed for moderate pain or mild pain.     oxyCODONE-acetaminophen (PERCOCET/ROXICET) 5-325 MG tablet Take 1 tablet by mouth every 8 (eight) hours as needed for severe pain.     potassium  chloride (KLOR-CON) 10 MEQ tablet Take 1 tablet (10 mEq total) by mouth daily. 90 tablet 3   rosuvastatin (CRESTOR) 10 MG tablet Take 1 tablet (10 mg total) by mouth daily. 90 tablet 3   Specialty Vitamins Products (BIOTIN PLUS KERATIN) 10000-100 MCG-MG TABS Take 1 tablet by mouth daily.     VITAMIN D-VITAMIN K PO Take 1 tablet by mouth daily. 125 mcg/ 90 mcg     No current facility-administered medications for this visit.    Allergies:   Patient has no known allergies.    Social History:  The patient  reports that she has been smoking cigarettes. She started smoking about 31 years ago. She has a 7.5 pack-year smoking history. She has never used smokeless tobacco. She reports current alcohol use. She reports that she does not use drugs.   Family History:  The patient's family history includes Diabetes in her maternal grandmother; Heart attack in her father; Heart disease in her father and mother; Stroke in her maternal grandmother.    ROS:  Please see the history of present illness.   Otherwise, review of systems are positive for none.   All other systems are reviewed and negative.    PHYSICAL EXAM: VS:  BP 138/60 (BP Location: Left Arm, Patient Position: Sitting, Cuff Size: Normal)   Pulse (!) 122   Ht 5\' 4"  (1.626 m)   Wt 98 lb 8 oz (44.7 kg)   SpO2 95%   BMI 16.91 kg/m  , BMI Body mass index is 16.91 kg/m. GEN: Well nourished, well developed, in no acute distress  HEENT: normal  Neck: no JVD, carotid bruits, or masses Cardiac: RRR; no murmurs, rubs, or gallops,no edema  Respiratory:  clear to auscultation bilaterally, normal work of breathing GI: soft, nontender, nondistended, + BS MS: no deformity or atrophy  Skin: warm and dry, no rash Neuro:  Strength and sensation are intact Psych: euthymic mood, full affect    EKG:  EKG is ordered today. EKG showed : Sinus tachycardia with occasional Premature ventricular complexes Left axis deviation Pulmonary disease  pattern Septal infarct (cited on or before 19-Jun-2011) When compared with ECG of 25-Sep-2019 16:34, Premature ventricular complexes are now Present Premature supraventricular complexes are no longer Present Questionable change in initial forces of Anterior leads    Recent Labs: 10/09/2022: ALT 11; TSH 0.359 02/05/2023: Hemoglobin 16.3; Platelets 212 02/12/2023: BUN 11; Creatinine, Ser 0.84; Potassium 3.8; Sodium 139    Lipid Panel    Component Value Date/Time   CHOL 233 (H) 07/31/2021 1024   TRIG 148 07/31/2021 1024   HDL 44 07/31/2021 1024   CHOLHDL 5.3 (H) 07/31/2021 1024   LDLCALC 162 (H) 07/31/2021 1024      Wt Readings from Last 3 Encounters:  07/08/23 98 lb 8 oz (44.7 kg)  03/07/23 100 lb (45.4 kg)  03/04/23  102 lb 4 oz (46.4 kg)           No data to display            ASSESSMENT AND PLAN:  1.  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  Symptoms improved with cilostazol and currently no plans for aortobifemoral bypass.  2.  Hyperlipidemia: Lipid profile in 2022 showed an LDL of 162.  Continue rosuvastatin.  I requested lipid and liver profile.  3.  Tobacco use: She cut down on tobacco use and is planning to quit completely by next week.  4.  Sinus tachycardia: I requested routine labs.  Will also check her thyroid function given history of hyperthyroidism.  5.  Preop cardiovascular evaluation for sacroiliac fusion surgery: Low risk from a cardiac standpoint as she had an echocardiogram and nuclear stress test that were both unremarkable.   Disposition: Follow-up in 6 months.  Signed,  Lorine Bears, MD  07/08/2023 3:09 PM    [facility]

## 2023-07-08 NOTE — Patient Instructions (Signed)
Medication Instructions:  No changes *If you need a refill on your cardiac medications before your next appointment, please call your pharmacy*   Lab Work: Your provider would like for you to have following labs drawn: Lipid, CBC, CMET, TSH, T3, T4.   If you have labs (blood work) drawn today and your tests are completely normal, you will receive your results only by: MyChart Message (if you have MyChart) OR A paper copy in the mail If you have any lab test that is abnormal or we need to change your treatment, we will call you to review the results.   Testing/Procedures: None ordered   Follow-Up: At [facility], you and your health needs are our priority.  As part of our continuing mission to provide you with exceptional heart care, we have created designated Provider Care Teams.  These Care Teams include your primary Cardiologist (physician) and Advanced Practice Providers (APPs -  Physician Assistants and Nurse Practitioners) who all work together to provide you with the care you need, when you need it.  We recommend signing up for the patient portal called "MyChart".  Sign up information is provided on this After Visit Summary.  MyChart is used to connect with patients for Virtual Visits (Telemedicine).  Patients are able to view lab/test results, encounter notes, upcoming appointments, etc.  Non-urgent messages can be sent to your provider as well.   To learn more about what you can do with MyChart, go to [email or web address].    Your next appointment:   6 month(s)  Provider:   You may see Lorine Bears, MD or one of the following Advanced Practice Providers on your designated Care Team:   Nicolasa Ducking, NP Eula Listen, PA-C Cadence Fransico Michael, PA-C Charlsie Quest, NP

## 2023-07-09 ENCOUNTER — Ambulatory Visit (INDEPENDENT_AMBULATORY_CARE_PROVIDER_SITE_OTHER): Payer: Medicare Other | Admitting: Primary Care

## 2023-07-09 ENCOUNTER — Ambulatory Visit (INDEPENDENT_AMBULATORY_CARE_PROVIDER_SITE_OTHER): Payer: Medicare Other

## 2023-07-09 ENCOUNTER — Encounter: Payer: Self-pay | Admitting: Primary Care

## 2023-07-09 VITALS — BP 138/68 | HR 128 | Temp 98.5°F | Ht 64.0 in | Wt 99.4 lb

## 2023-07-09 DIAGNOSIS — Z01811 Encounter for preprocedural respiratory examination: Secondary | ICD-10-CM | POA: Diagnosis not present

## 2023-07-09 DIAGNOSIS — J449 Chronic obstructive pulmonary disease, unspecified: Secondary | ICD-10-CM

## 2023-07-09 DIAGNOSIS — Z720 Tobacco use: Secondary | ICD-10-CM

## 2023-07-09 DIAGNOSIS — Z01818 Encounter for other preprocedural examination: Secondary | ICD-10-CM | POA: Diagnosis not present

## 2023-07-09 DIAGNOSIS — Z87891 Personal history of nicotine dependence: Secondary | ICD-10-CM

## 2023-07-09 DIAGNOSIS — R918 Other nonspecific abnormal finding of lung field: Secondary | ICD-10-CM | POA: Diagnosis not present

## 2023-07-09 DIAGNOSIS — F17210 Nicotine dependence, cigarettes, uncomplicated: Secondary | ICD-10-CM | POA: Diagnosis not present

## 2023-07-09 NOTE — Assessment & Plan Note (Signed)
-   Current smoker, she has picked a quit date of September 15th, 2024 - Referring to lung cancer screening program

## 2023-07-09 NOTE — Progress Notes (Signed)
@Patient  ID: Pamela Haley, female    DOB: December 14, 1950, 72 y.o.   MRN: [id number]  Chief Complaint  Patient presents with   Consult    Surgical clearance for sacroiliac repair surgery     Referring provider: Lorre Munroe, NP  HPI: 72 year old female, current every day smoker. PMH significant for stage 3 COPD, HTN, HLD, prediabetes, tobacco abuse. Patient of Dr. Jayme Cloud, last seen on 09/13/22.   07/09/2023 Patient presents today for surgical risk assessment. Followed by our office for COPD. Planning for right SI joint fusion, date to be determined. This will be a day surgery, she will have general surgery and length of surgery will be about 20 mins. She has had no prior difficulty with anesthesia.   Breathing wise she is at her baseline. No recent respiratory infections requiring antibiotics/steroids. She chronic cough, occasionally productive without purulence. Maintained on [employer], she gets mediation through patient assistance. Spirometry in September 2022 showed severe obstructive airway disease. Overdue for follow-up breathing test. She is updated with flu and covid vaccines. Plans on getting RSV next week. She is still smoking but has cut back to 4 cigarettes a day and has a projected quit date for Sunday 9/15.   Tachycardia is not new, she saw cardiology yesterday. She has hx of hyperthyroidism, getting lab work done.   07/19/21 Spirometry >> FVC 1.65 (53%), FEV1 0.69 (30%), RATIO  No Known Allergies  Immunization History  Administered Date(s) Administered   Fluad Quad(high Dose 65+) 07/26/2019, 08/02/2022   Influenza, High Dose Seasonal PF 08/25/2018, 07/10/2021   Influenza,inj,Quad PF,6+ Mos 09/11/2016   Moderna Covid-19 Vaccine Bivalent Booster 18yrs & up 07/10/2021   Moderna SARS-COV2 Booster Vaccination 08/21/2020, 08/02/2022   Moderna Sars-Covid-2 Vaccination 12/02/2019, 12/29/2019, 08/11/2020   Pneumococcal Conjugate-13 08/25/2018    Past Medical  History:  Diagnosis Date   Abnormal EKG    HX OF PREVIOUS EKGS SHOWING POSSIBLE MI--BUT PT HAS NEVER HAD ANY HEART PROBLEMS--HAD BACK SURGERY AUG 2012 AT [facility] - NO HEART PROBLEMS   Chronic cough    PT IS A SMOKER   DDD (degenerative disc disease), lumbar    PAIN IN LOWER BACK AND DOWN RT HIP AND LEG--NUMBNESS, THROBBING, BURNING   Hyperthyroidism 02/20/2015    Tobacco History: Social History   Tobacco Use  Smoking Status Every Day   Current packs/day: 0.00   Average packs/day: 0.3 packs/day for 30.0 years (7.5 ttl pk-yrs)   Types: Cigarettes   Start date: 03/13/1992   Last attempt to quit: 03/13/2022   Years since quitting: 1.3  Smokeless Tobacco Never  Tobacco Comments   Patient is smoking about 4 cigarettes a day. Updated 07/09/2023. Amy marsh,cma   Ready to quit: Not Answered Counseling given: Not Answered Tobacco comments: Patient is smoking about 4 cigarettes a day. Updated 07/09/2023. Amy marsh,cma   Outpatient Medications Prior to Visit  Medication Sig Dispense Refill   albuterol (VENTOLIN HFA) 108 (90 Base) MCG/ACT inhaler Inhale 1-2 puffs into the lungs every 6 (six) hours as needed for wheezing or shortness of breath. 18 g 2   Ascorbic Acid (VITAMIN C PO) Take 1,000 mg by mouth daily.     aspirin EC 81 MG tablet Take 1 tablet (81 mg total) by mouth daily. Swallow whole. 90 tablet 3   B Complex-C (B-COMPLEX WITH VITAMIN C) tablet Take 1 tablet by mouth daily.     Budeson-Glycopyrrol-Formoterol (BREZTRI AEROSPHERE) 160-9-4.8 MCG/ACT AERO Inhale 2 puffs into the lungs in the  morning and at bedtime. 10.7 g 11   cilostazol (PLETAL) 100 MG tablet TAKE 1 TABLET BY MOUTH TWICE DAILY BEFORE A MEAL 60 tablet 5   COLLAGEN-VITAMIN C-BIOTIN PO Take 1 tablet by mouth daily.     cyclobenzaprine (FLEXERIL) 10 MG tablet Take 10 mg by mouth 2 (two) times daily as needed for muscle spasms.     Gelatin 600 MG CAPS Take 3 capsules by mouth daily.     potassium chloride (KLOR-CON) 10 MEQ  tablet Take 1 tablet (10 mEq total) by mouth daily. 90 tablet 3   rosuvastatin (CRESTOR) 10 MG tablet Take 1 tablet (10 mg total) by mouth daily. 90 tablet 3   Specialty Vitamins Products (BIOTIN PLUS KERATIN) 10000-100 MCG-MG TABS Take 1 tablet by mouth daily.     VITAMIN D-VITAMIN K PO Take 1 tablet by mouth daily. 125 mcg/ 90 mcg     ibuprofen (ADVIL) 800 MG tablet Take 800 mg by mouth 2 (two) times daily as needed for moderate pain or mild pain. (Patient not taking: Reported on 07/09/2023)     oxyCODONE-acetaminophen (PERCOCET/ROXICET) 5-325 MG tablet Take 1 tablet by mouth every 8 (eight) hours as needed for severe pain. (Patient not taking: Reported on 07/09/2023)     No facility-administered medications prior to visit.   Review of Systems  Review of Systems  Constitutional: Negative.   HENT: Negative.    Respiratory:  Positive for cough. Negative for shortness of breath and wheezing.   Cardiovascular: Negative.  Negative for palpitations.    Physical Exam  BP 138/68 (BP Location: Left Arm, Patient Position: Sitting, Cuff Size: Normal)   Pulse (!) 128   Temp 98.5 F (36.9 C) (Oral)   Ht 5\' 4"  (1.626 m)   Wt 99 lb 6.4 oz (45.1 kg)   SpO2 95%   BMI 17.06 kg/m  Physical Exam Constitutional:      Appearance: Normal appearance.  HENT:     Head: Normocephalic and atraumatic.     Mouth/Throat:     Mouth: Mucous membranes are moist.     Pharynx: Oropharynx is clear.  Cardiovascular:     Rate and Rhythm: Regular rhythm. Tachycardia present.  Pulmonary:     Effort: Pulmonary effort is normal.     Breath sounds: No wheezing, rhonchi or rales.  Skin:    General: Skin is warm and dry.  Neurological:     General: No focal deficit present.     Mental Status: She is alert and oriented to person, place, and time. Mental status is at baseline.  Psychiatric:        Mood and Affect: Mood normal.        Behavior: Behavior normal.        Thought Content: Thought content normal.         Judgment: Judgment normal.      Lab Results:  CBC    Component Value Date/Time   WBC 12.3 (H) 02/05/2023 1050   RBC 5.35 (H) 02/05/2023 1050   HGB 16.3 (H) 02/05/2023 1050   HGB 16.2 (H) 10/09/2022 1020   HCT 50.1 (H) 02/05/2023 1050   HCT 48.3 (H) 10/09/2022 1020   PLT 212 02/05/2023 1050   PLT 381 10/09/2022 1020   MCV 93.6 02/05/2023 1050   MCV 92 10/09/2022 1020   MCH 30.5 02/05/2023 1050   MCHC 32.5 02/05/2023 1050   RDW 13.7 02/05/2023 1050   RDW 12.0 10/09/2022 1020   LYMPHSABS 4.0 05/15/2012 1525  MONOABS 0.8 05/15/2012 1525   EOSABS 0.3 05/15/2012 1525   BASOSABS 0.1 05/15/2012 1525    BMET    Component Value Date/Time   NA 139 02/12/2023 0859   NA 145 (H) 10/09/2022 1020   K 3.8 02/12/2023 0859   CL 103 02/12/2023 0859   CO2 26 02/12/2023 0859   GLUCOSE 107 (H) 02/12/2023 0859   BUN 11 02/12/2023 0859   BUN 16 10/09/2022 1020   CREATININE 0.84 02/12/2023 0859   CALCIUM 9.6 02/12/2023 0859   GFRNONAA >60 02/12/2023 0859   GFRAA 93 07/20/2020 1541    BNP No results found for: "BNP"  ProBNP No results found for: "PROBNP"  Imaging: No results found.   Assessment & Plan:   COPD (chronic obstructive pulmonary disease) (HCC) - Currently stable; No recent exacerbations requiring antibiotics or steroids. Breathing is baseline. Chronic cough which is occasionally productive. Maintained on [employer], gets medication through [place] [employer]. Up to date with vaccines, getting RSV next week. Working on smoking cessation. Needs updated PFT prior to next visit. FU in 3 months.   Encounter for pre-operative respiratory clearance - Patient will be considered intermediate risk for prolonged mechanical ventilation and/or postop pulmonary complications due to age, smoking status and history of severe COPD.  Exam today was benign.  She does have a chronic cough, we will get routine chest imaging today. Ultimate clearance decided upon by surgeon.   Major  Pulmonary risks identified in the multifactorial risk analysis are but not limited to a) pneumonia; b) recurrent intubation risk; c) prolonged or recurrent acute respiratory failure needing mechanical ventilation; d) prolonged hospitalization; e) DVT/Pulmonary embolism; f) Acute Pulmonary edema  Recommend 1. Short duration of surgery as much as possible and avoid paralytic if possible 2. Recovery in step down or ICU with Pulmonary consultation if needed 3. DVT prophylaxis 4. Aggressive pulmonary toilet with o2, bronchodilatation, and incentive spirometry and early ambulation    Tobacco abuse - Current smoker, she has picked a quit date of September 15th, 2024 - Referring to lung cancer screening program  Glenford Bayley, NP 07/09/2023

## 2023-07-09 NOTE — Assessment & Plan Note (Signed)
-   Patient will be considered intermediate risk for prolonged mechanical ventilation and/or postop pulmonary complications due to age, smoking status and history of severe COPD.  Exam today was benign.  She does have a chronic cough, we will get routine chest imaging today. Ultimate clearance decided upon by surgeon.   Major Pulmonary risks identified in the multifactorial risk analysis are but not limited to a) pneumonia; b) recurrent intubation risk; c) prolonged or recurrent acute respiratory failure needing mechanical ventilation; d) prolonged hospitalization; e) DVT/Pulmonary embolism; f) Acute Pulmonary edema  Recommend 1. Short duration of surgery as much as possible and avoid paralytic if possible 2. Recovery in step down or ICU with Pulmonary consultation if needed 3. DVT prophylaxis 4. Aggressive pulmonary toilet with o2, bronchodilatation, and incentive spirometry and early ambulation

## 2023-07-09 NOTE — Assessment & Plan Note (Signed)
-   Currently stable; No recent exacerbations requiring antibiotics or steroids. Breathing is baseline. Chronic cough which is occasionally productive. Maintained on [employer], gets medication through [employer]. Up to date with vaccines, getting RSV next week. Working on smoking cessation. Needs updated PFT prior to next visit. FU in 3 months.

## 2023-07-09 NOTE — Patient Instructions (Addendum)
You are intermediate risk for prolonged mechanical ventilation and/or postop pulmonary complications due to your history of severe COPD, clearance will be decided upon by surgeon.    Recommendations: Continue Breztri Aerosphere two puffs morning and evening  Orders: PFTs at [facility] (prior to next visit) CXR today re: pre-op   Referral: Lung cancer screening   Follow-up:  3 months with Dr. Jayme Cloud    Steps to Quit Smoking Smoking tobacco is the leading cause of preventable death. It can affect almost every organ in the body. Smoking puts you and people around you at risk for many serious, long-lasting (chronic) diseases. Quitting smoking can be hard, but it is one of the best things that you can do for your health. It is never too late to quit. Do not give up if you cannot quit the first time. Some people need to try many times to quit. Do your best to stick to your quit plan, and talk with your doctor if you have any questions or concerns. How do I get ready to quit? Pick a date to quit. Set a date within the next 2 weeks to give you time to prepare. Write down the reasons why you are quitting. Keep this list in places where you will see it often. Tell your family, friends, and co-workers that you are quitting. Their support is important. Talk with your doctor about the choices that may help you quit. Find out if your health insurance will pay for these treatments. Know the people, places, things, and activities that make you want to smoke (triggers). Avoid them. What first steps can I take to quit smoking? Throw away all cigarettes at home, at work, and in your car. Throw away the things that you use when you smoke, such as ashtrays and lighters. Clean your car. Empty the ashtray. Clean your home, including curtains and carpets. What can I do to help me quit smoking? Talk with your doctor about taking medicines and seeing a counselor. You are more likely to succeed when  you do both. If you are pregnant or breastfeeding: Talk with your doctor about counseling or other ways to quit smoking. Do not take medicine to help you quit smoking unless your doctor tells you to. Quit right away Quit smoking completely, instead of slowly cutting back on how much you smoke over a period of time. Stopping smoking right away may be more successful than slowly quitting. Go to counseling. In-person is best if this is an option. You are more likely to quit if you go to counseling sessions regularly. Take medicine You may take medicines to help you quit. Some medicines need a prescription, and some you can buy over-the-counter. Some medicines may contain a drug called nicotine to replace the nicotine in cigarettes. Medicines may: Help you stop having the desire to smoke (cravings). Help to stop the problems that come when you stop smoking (withdrawal symptoms). Your doctor may ask you to use: Nicotine patches, gum, or lozenges. Nicotine inhalers or sprays. Non-nicotine medicine that you take by mouth. Find resources Find resources and other ways to help you quit smoking and remain smoke-free after you quit. They include: Online chats with a Veterinary surgeon. Phone quitlines. Printed Materials engineer. Support groups or group counseling. Text messaging programs. Mobile phone apps. Use apps on your mobile phone or tablet that can help you stick to your quit plan. Examples of free services include Quit Guide from the CDC and smokefree.gov  What can I do to  make it easier to quit?  Talk to your family and friends. Ask them to support and encourage you. Call a phone quitline, such as 1-800-QUIT-NOW, reach out to support groups, or work with a Veterinary surgeon. Ask people who smoke to not smoke around you. Avoid places that make you want to smoke, such as: Bars. Parties. Smoke-break areas at work. Spend time with people who do not smoke. Lower the stress in your life. Stress can make  you want to smoke. Try these things to lower stress: Getting regular exercise. Doing deep-breathing exercises. Doing yoga. Meditating. What benefits will I see if I quit smoking? Over time, you may have: A better sense of smell and taste. Less coughing and sore throat. A slower heart rate. Lower blood pressure. Clearer skin. Better breathing. Fewer sick days. Summary Quitting smoking can be hard, but it is one of the best things that you can do for your health. Do not give up if you cannot quit the first time. Some people need to try many times to quit. When you decide to quit smoking, make a plan to help you succeed. Quit smoking right away, not slowly over a period of time. When you start quitting, get help and support to keep you smoke-free. This information is not intended to replace advice given to you by your health care provider. Make sure you discuss any questions you have with your health care provider. Document Revised: 10/05/2021 Document Reviewed: 10/05/2021 Elsevier Patient Education  2024 [employer].

## 2023-07-10 NOTE — Progress Notes (Signed)
Agree with the details of the visit as noted by Elizabeth Walsh, NP.  C. Laura Gonzalez, MD [facility] 

## 2023-07-11 ENCOUNTER — Telehealth: Payer: Self-pay | Admitting: Pulmonary Disease

## 2023-07-11 MED ORDER — BREZTRI AEROSPHERE 160-9-4.8 MCG/ACT IN AERO: 2.0000 | INHALATION_SPRAY | Freq: Two times a day (BID) | RESPIRATORY_TRACT | Status: DC

## 2023-07-11 MED ORDER — BREZTRI AEROSPHERE 160-9-4.8 MCG/ACT IN AERO: 2.0000 | INHALATION_SPRAY | Freq: Two times a day (BID) | RESPIRATORY_TRACT | 11 refills | Status: DC

## 2023-07-11 NOTE — Telephone Encounter (Signed)
Rx printed and placed in Dr. Georgann Housekeeper folder for signature.  Patient is aware and voiced her understanding.

## 2023-07-11 NOTE — Telephone Encounter (Signed)
Pt. Called she did the [facility] paperwork online and needs a new script written for the Brezrti please advise pt.

## 2023-07-15 ENCOUNTER — Telehealth: Payer: Self-pay | Admitting: Pulmonary Disease

## 2023-07-15 NOTE — Telephone Encounter (Signed)
Pamela Haley states RX for [name] needs directions. Reference number is [id number]. Tammy Sours phone number is [phone].

## 2023-07-15 NOTE — Telephone Encounter (Signed)
I have faxed the prescription to [facility] and I have notified the patient.  Nothing further needed.

## 2023-07-15 NOTE — Telephone Encounter (Signed)
Spoke to Melody with [facility]me and verified that Markus Daft is 2 puffs BID.  Nothing further needed.

## 2023-07-16 DIAGNOSIS — R0609 Other forms of dyspnea: Secondary | ICD-10-CM | POA: Diagnosis not present

## 2023-07-16 DIAGNOSIS — E782 Mixed hyperlipidemia: Secondary | ICD-10-CM | POA: Diagnosis not present

## 2023-07-16 LAB — SPECIMEN STATUS REPORT

## 2023-07-17 ENCOUNTER — Other Ambulatory Visit: Payer: Self-pay | Admitting: *Deleted

## 2023-07-17 MED ORDER — ROSUVASTATIN CALCIUM 40 MG PO TABS: 40.0000 mg | ORAL_TABLET | Freq: Every day | ORAL | 3 refills | Status: DC

## 2023-07-25 ENCOUNTER — Ambulatory Visit: Payer: Medicare Other | Admitting: Vascular Surgery

## 2023-07-31 DIAGNOSIS — I517 Cardiomegaly: Secondary | ICD-10-CM | POA: Diagnosis not present

## 2023-07-31 DIAGNOSIS — M47818 Spondylosis without myelopathy or radiculopathy, sacral and sacrococcygeal region: Secondary | ICD-10-CM | POA: Diagnosis not present

## 2023-07-31 DIAGNOSIS — Z01818 Encounter for other preprocedural examination: Secondary | ICD-10-CM | POA: Diagnosis not present

## 2023-07-31 DIAGNOSIS — I252 Old myocardial infarction: Secondary | ICD-10-CM | POA: Diagnosis not present

## 2023-07-31 DIAGNOSIS — I493 Ventricular premature depolarization: Secondary | ICD-10-CM | POA: Diagnosis not present

## 2023-07-31 DIAGNOSIS — Z981 Arthrodesis status: Secondary | ICD-10-CM | POA: Diagnosis not present

## 2023-07-31 DIAGNOSIS — R000 Tachycardia, unspecified: Secondary | ICD-10-CM | POA: Diagnosis not present

## 2023-07-31 DIAGNOSIS — M461 Sacroiliitis, not elsewhere classified: Secondary | ICD-10-CM | POA: Diagnosis not present

## 2023-08-04 DIAGNOSIS — J449 Chronic obstructive pulmonary disease, unspecified: Secondary | ICD-10-CM | POA: Diagnosis not present

## 2023-08-04 DIAGNOSIS — F17210 Nicotine dependence, cigarettes, uncomplicated: Secondary | ICD-10-CM | POA: Diagnosis not present

## 2023-08-04 DIAGNOSIS — M4326 Fusion of spine, lumbar region: Secondary | ICD-10-CM | POA: Diagnosis not present

## 2023-08-04 DIAGNOSIS — Z79899 Other long term (current) drug therapy: Secondary | ICD-10-CM | POA: Diagnosis not present

## 2023-08-04 DIAGNOSIS — M461 Sacroiliitis, not elsewhere classified: Secondary | ICD-10-CM | POA: Diagnosis not present

## 2023-08-19 NOTE — Progress Notes (Unsigned)
Office Note     CC: Occluded aorta Requesting Provider:  Lorre Munroe, NP  HPI: Pamela Haley is a 72 y.o. ([birth year]) female presenting in follow-up with claudication and imaging demonstrating occluded aorta, right iliac system.  On exam, Pamela Haley was doing well, accompanied by her son..  A native of [place], she lived in the [place] area prior to moving to [place] years ago.  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  Pamela Haley currently lives independently, and helps take care of her 54-year-old grandson.  Her daughter also lives in the home as she is currently going through a divorce.  Since last seen, Pamela Haley feels as though she is doing well.  She is able to walk a few more blocks than previous.  She thinks the Pletal kicked in after roughly 1 month.  She continues to smoke, but is backing down.  Pamela Haley had fusion of her SI joint and her back a few weeks ago, and stated that has also helped some of her pain.  She denies symptoms of ischemic rest pain, tissue loss.   Past Medical History:  Diagnosis Date   Abnormal EKG    HX OF PREVIOUS EKGS SHOWING POSSIBLE MI--BUT PT HAS NEVER HAD ANY HEART PROBLEMS--HAD BACK SURGERY AUG 2012 AT [facility] - NO HEART PROBLEMS   Chronic cough    PT IS A SMOKER   DDD (degenerative disc disease), lumbar    PAIN IN LOWER BACK AND DOWN RT HIP AND LEG--NUMBNESS, THROBBING, BURNING   Hyperthyroidism 02/20/2015    Past Surgical History:  Procedure Laterality Date   ABDOMINAL AORTOGRAM W/LOWER EXTREMITY N/A 02/12/2023   Procedure: ABDOMINAL AORTOGRAM W/LOWER EXTREMITY;  Surgeon: Iran Ouch, MD;  Location: [facility];  Service: Cardiovascular;  Laterality: N/A;   BACK SURGERY  05/2011   lumbar surgery at [facility]   BILATERAL CARPAL TUNNEL RELEASE     C SECTIONS X 3      Social History   Socioeconomic History   Marital status: Single    Spouse name: Not on file   Number of children: Not on file   Years of education:  Not on file   Highest education level: Not on file  Occupational History   Not on file  Tobacco Use   Smoking status: Every Day    Current packs/day: 0.00    Average packs/day: 0.3 packs/day for 30.0 years (7.5 ttl pk-yrs)    Types: Cigarettes    Start date: 03/13/1992    Last attempt to quit: 03/13/2022    Years since quitting: 1.4   Smokeless tobacco: Never   Tobacco comments:    Patient is smoking about 4 cigarettes a day. Updated 07/09/2023. Amy marsh,cma  Vaping Use   Vaping status: Never Used  Substance and Sexual Activity   Alcohol use: Yes    Alcohol/week: 0.0 standard drinks of alcohol    Comment: rare   Drug use: No   Sexual activity: Not Currently  Other Topics Concern   Not on file  Social History Narrative   Not on file   Social Determinants of Health   Financial Resource Strain: Low Risk  (12/27/2022)   Overall Financial Resource Strain (CARDIA)    Difficulty of Paying Living Expenses: Not very hard  Food Insecurity: Low Risk  (08/04/2023)   Received from [facility]   Hunger Vital Sign    Worried About Running Out of Food in the Last Year: Never true  Ran Out of Food in the Last Year: Never true  Transportation Needs: No Transportation Needs (08/04/2023)   Received from [facility]    In the past 12 months, has lack of reliable transportation kept you from medical appointments, meetings, work or from getting things needed for daily living? : No  Physical Activity: Insufficiently Active (12/27/2022)   Exercise Vital Sign    Days of Exercise per Week: 3 days    Minutes of Exercise per Session: 30 min  Stress: No Stress Concern Present (12/27/2022)   Pamela Haley of Occupational Health - Occupational Stress Questionnaire    Feeling of Stress : Not at all  Social Connections: Socially Isolated (12/27/2022)   Social Connection and Isolation Panel [NHANES]    Frequency of Communication with Friends and Family: Three times a week    Frequency  of Social Gatherings with Friends and Family: Never    Attends Religious Services: Never    Database administrator or Organizations: No    Attends Banker Meetings: Never    Marital Status: Divorced  Catering manager Violence: Not At Risk (12/27/2022)   Humiliation, Afraid, Rape, and Kick questionnaire    Fear of Current or Ex-Partner: No    Emotionally Abused: No    Physically Abused: No    Sexually Abused: No   Family History  Problem Relation Age of Onset   Heart disease Mother    Heart attack Father    Heart disease Father    Stroke Maternal Grandmother    Diabetes Maternal Grandmother     Current Outpatient Medications  Medication Sig Dispense Refill   albuterol (VENTOLIN HFA) 108 (90 Base) MCG/ACT inhaler Inhale 1-2 puffs into the lungs every 6 (six) hours as needed for wheezing or shortness of breath. 18 g 2   Ascorbic Acid (VITAMIN C PO) Take 1,000 mg by mouth daily.     aspirin EC 81 MG tablet Take 1 tablet (81 mg total) by mouth daily. Swallow whole. 90 tablet 3   B Complex-C (B-COMPLEX WITH VITAMIN C) tablet Take 1 tablet by mouth daily.     Budeson-Glycopyrrol-Formoterol (BREZTRI AEROSPHERE) 160-9-4.8 MCG/ACT AERO Inhale 2 puffs into the lungs in the morning and at bedtime.     cilostazol (PLETAL) 100 MG tablet TAKE 1 TABLET BY MOUTH TWICE DAILY BEFORE A MEAL 60 tablet 5   COLLAGEN-VITAMIN C-BIOTIN PO Take 1 tablet by mouth daily.     cyclobenzaprine (FLEXERIL) 10 MG tablet Take 10 mg by mouth 2 (two) times daily as needed for muscle spasms.     Gelatin 600 MG CAPS Take 3 capsules by mouth daily.     potassium chloride (KLOR-CON) 10 MEQ tablet Take 1 tablet (10 mEq total) by mouth daily. 90 tablet 3   rosuvastatin (CRESTOR) 40 MG tablet Take 1 tablet (40 mg total) by mouth daily. 90 tablet 3   Specialty Vitamins Products (BIOTIN PLUS KERATIN) 10000-100 MCG-MG TABS Take 1 tablet by mouth daily.     VITAMIN D-VITAMIN K PO Take 1 tablet by mouth daily. 125 mcg/  90 mcg     No current facility-administered medications for this visit.    No Known Allergies   REVIEW OF SYSTEMS:  [X]  denotes positive finding, [ ]  denotes negative finding Cardiac  Comments:  Chest pain or chest pressure:    Shortness of breath upon exertion:    Short of breath when lying flat:    Irregular heart rhythm:  Vascular    Pain in calf, thigh, or hip brought on by ambulation:    Pain in feet at night that wakes you up from your sleep:     Blood clot in your veins:    Leg swelling:         Pulmonary    Oxygen at home:    Productive cough:     Wheezing:         Neurologic    Sudden weakness in arms or legs:     Sudden numbness in arms or legs:     Sudden onset of difficulty speaking or slurred speech:    Temporary loss of vision in one eye:     Problems with dizziness:         Gastrointestinal    Blood in stool:     Vomited blood:         Genitourinary    Burning when urinating:     Blood in urine:        Psychiatric    Major depression:         Hematologic    Bleeding problems:    Problems with blood clotting too easily:        Skin    Rashes or ulcers:        Constitutional    Fever or chills:      PHYSICAL EXAMINATION:  There were no vitals filed for this visit.  General:  WDWN in NAD; vital signs documented above Gait: Not observed HENT: WNL, normocephalic Pulmonary: normal non-labored breathing , without wheezing Cardiac: regular HR Abdomen: soft, NT, no masses Skin: without rashes Vascular Exam/Pulses:  Right Left  Radial 2+ (normal) 2+ (normal)  Ulnar 2+ (normal) 2+ (normal)  Femoral absent absent  Popliteal    DP absent absent  PT     Extremities: without ischemic changes, without Gangrene , without cellulitis; without open wounds;  Musculoskeletal: no muscle wasting or atrophy  Neurologic: A&O X 3;  No focal weakness or paresthesias are detected Psychiatric:  The pt has Normal affect.   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 female presenting with occluded distal aorta, right-sided iliac system with symptoms of moderate to severe claudication.   Today Elsye symptoms appear to be slightly improved from previous.  We had a long discussion regarding her bilateral lower extremity claudication.  She does not feel like it is lifestyle limiting at this time.  We once again discussed that [employer] would need an aortobifemoral bypass to optimize flow to the lower extremities due to the distal aortic right-sided iliac artery occlusions.  We discussed the risks and benefits of surgery, and I was honest about the historical mortality rate being roughly 5%.    Being that symptoms are not lifestyle limiting, we discussed continued medical management with Pletal, and dedicated walking routine.  Ronesha was very appreciative of our conversation, and after discussing the risks and benefits felt comfortable holding on open surgical intervention.  My plan is to see her in 6 months time to assess her symptoms.  I gave her my card and asked her to call immediately should new symptoms arise or worsen as I am happy to take care of her.    Victorino Sparrow, MD [facility] [phone]

## 2023-08-21 ENCOUNTER — Encounter: Payer: Self-pay | Admitting: Vascular Surgery

## 2023-08-21 ENCOUNTER — Ambulatory Visit: Payer: Medicare Other | Admitting: Vascular Surgery

## 2023-08-21 VITALS — BP 120/70 | HR 123 | Temp 99.5°F | Resp 20 | Ht 64.0 in | Wt 98.9 lb

## 2023-08-21 DIAGNOSIS — I70213 Atherosclerosis of native arteries of extremities with intermittent claudication, bilateral legs: Secondary | ICD-10-CM

## 2023-08-21 DIAGNOSIS — I700 Atherosclerosis of aorta: Secondary | ICD-10-CM

## 2023-09-05 DIAGNOSIS — M4326 Fusion of spine, lumbar region: Secondary | ICD-10-CM | POA: Diagnosis not present

## 2023-10-14 ENCOUNTER — Ambulatory Visit: Payer: Medicare Other | Admitting: Pulmonary Disease

## 2023-10-14 ENCOUNTER — Encounter: Payer: Self-pay | Admitting: Ophthalmology

## 2023-10-14 ENCOUNTER — Encounter: Payer: Self-pay | Admitting: Pulmonary Disease

## 2023-10-14 VITALS — BP 120/70 | HR 104 | Temp 96.9°F | Ht 64.0 in | Wt 99.0 lb

## 2023-10-14 DIAGNOSIS — M533 Sacrococcygeal disorders, not elsewhere classified: Secondary | ICD-10-CM

## 2023-10-14 DIAGNOSIS — J449 Chronic obstructive pulmonary disease, unspecified: Secondary | ICD-10-CM

## 2023-10-14 DIAGNOSIS — F17210 Nicotine dependence, cigarettes, uncomplicated: Secondary | ICD-10-CM

## 2023-10-14 DIAGNOSIS — G8929 Other chronic pain: Secondary | ICD-10-CM | POA: Diagnosis not present

## 2023-10-14 NOTE — Anesthesia Preprocedure Evaluation (Addendum)
Anesthesia Evaluation  Patient identified by MRN, date of birth, ID band Patient awake    Reviewed: Allergy & Precautions, H&P , NPO status , Patient's Chart, lab work & pertinent test results  Airway Mallampati: III  TM Distance: >3 FB Neck ROM: Full    Dental no notable dental hx. (+) Caps Caps or crowns upper front teeth:   Pulmonary neg pulmonary ROS, COPD, Current Smoker and Patient abstained from smoking.  States she is not on any oxygen at home Pulmonary exam normal breath sounds clear to auscultation       Cardiovascular + Peripheral Vascular Disease  negative cardio ROS Normal cardiovascular exam Rhythm:Regular Rate:Normal  11-13-22  1. Left ventricular ejection fraction, by estimation, is 60 to 65%. The  left ventricle has normal function. The left ventricle has no regional  wall motion abnormalities. Left ventricular diastolic parameters are  consistent with Grade II diastolic  dysfunction (pseudonormalization).   2. Right ventricular systolic function is normal. The right ventricular  size is normal. There is normal pulmonary artery systolic pressure.   3. The mitral valve is normal in structure. No evidence of mitral valve  regurgitation.   4. The aortic valve was not well visualized. Aortic valve regurgitation  is not visualized. Aortic valve sclerosis is present, with no evidence of  aortic valve stenosis.   5. The inferior vena cava is normal in size with greater than 50%  respiratory variability, suggesting right atrial pressure of 3 mmHg.   02-12-23 .  Heavily calcified and occluded distal aorta into the iliac arteries with reconstitution via collaterals from the mesenteric arteries. 2.  Right lower extremity: Significant common femoral artery stenosis with no significant disease in the SFA.  Full runoff not performed as there was no significant disease by duplex. 3.  Left lower extremity: Mild common femoral  disease and normal SFA.  Full runoff not performed as there was no disease by duplex. 4.  The procedure was initially done via the right common femoral artery but then I could not get into the distal aorta due to occlusion and the procedure was finished.  The right radial artery.  Recommend evaluation for aortobifemoral bypass with right common femoral artery endarterectomy     Neuro/Psych negative neurological ROS  negative psych ROS   GI/Hepatic negative GI ROS, Neg liver ROS,,,  Endo/Other  negative endocrine ROS Hyperthyroidism   Renal/GU negative Renal ROS  negative genitourinary   Musculoskeletal negative musculoskeletal ROS (+) Arthritis ,    Abdominal   Peds negative pediatric ROS (+)  Hematology negative hematology ROS (+)   Anesthesia Other Findings Abnormal EKG  Chronic cough DDD (degenerative disc disease), lumbar Hyperthyroidism PAD (peripheral artery disease) (HCC)  COPD (chronic obstructive pulmonary disease) Occluded aorta--distal, worse on right Smoker---continues to smoke, but less Atherosclerosis of native artery of both lower extremities with intermittent claudication (HCC) Grade II diastolic dysfunction   Reproductive/Obstetrics negative OB ROS                             Anesthesia Physical Anesthesia Plan  ASA: 4  Anesthesia Plan: MAC   Post-op Pain Management:    Induction: Intravenous  PONV Risk Score and Plan:   Airway Management Planned: Natural Airway and Nasal Cannula  Additional Equipment:   Intra-op Plan:   Post-operative Plan:   Informed Consent: I have reviewed the patients History and Physical, chart, labs and discussed the procedure including the risks, benefits  and alternatives for the proposed anesthesia with the patient or authorized representative who has indicated his/her understanding and acceptance.     Dental Advisory Given  Plan Discussed with: Anesthesiologist, CRNA and  Surgeon  Anesthesia Plan Comments: (Patient consented for risks of anesthesia including but not limited to:  - adverse reactions to medications - damage to eyes, teeth, lips or other oral mucosa - nerve damage due to positioning  - sore throat or hoarseness - Damage to heart, brain, nerves, lungs, other parts of body or loss of life  Patient voiced understanding and assent.)        Anesthesia Quick Evaluation

## 2023-10-14 NOTE — Patient Instructions (Signed)
VISIT SUMMARY:  During today's visit, we reviewed your COPD management, post-sacroiliac joint fusion pain, and recent vision problems due to cataracts. We discussed your current treatments and made some adjustments to better manage your symptoms. We also talked about the importance of smoking cessation and scheduled your upcoming cataract surgery.  YOUR PLAN:  -CHRONIC OBSTRUCTIVE PULMONARY DISEASE (COPD): COPD is a chronic lung disease that makes it hard to breathe. Your condition is stable with Breztri, and you rarely need your rescue inhaler. We discussed using albuterol in the morning to help with occasional shortness of breath and the importance of quitting smoking. Continue using Breztri and try to reduce smoking. Use albuterol in the morning before going outside.  -POST-SI JOINT FUSION PAIN: Post-SI joint fusion pain refers to discomfort at the site of your recent sacroiliac joint surgery. Your pain varies, and Aleve helps but should be used sparingly to avoid stomach issues. Continue taking Aleve as needed, but do not exceed the recommended dosage.  -CATARACTS: Cataracts cause clouding of the eye's lens, leading to vision problems like double vision. You are scheduled for cataract surgery on Monday, which is generally well-tolerated with a quick recovery. Follow all post-operative care instructions, including using prescribed eye drops and wearing a protective shield at night.  -GENERAL HEALTH MAINTENANCE: We discussed the importance of routine health maintenance. Please remember to schedule your lung cancer screening appointment as soon as possible.  INSTRUCTIONS:  Please schedule your lung cancer screening appointment. Follow up with [place] in 4 months for your next appointment.

## 2023-10-14 NOTE — Progress Notes (Unsigned)
Subjective:    Patient ID: Pamela Haley, female    DOB: Mar 16, 1951, 72 y.o.   MRN: [id number]  Patient Care Team: Lorre Munroe, NP as PCP - General (Internal Medicine) Iran Ouch, MD as PCP - Cardiology (Cardiology) Salena Saner, MD as Consulting Physician (Pulmonary Disease)  No chief complaint on file.   BACKGROUND/INTERVAL:72 year old female, current every day smoker. PMH significant for stage 3 COPD, HTN, HLD, prediabetes, tobacco abuse. Last seen on 09/13/22.   HPI    Review of Systems A 10 point review of systems was performed and it is as noted above otherwise negative.   Patient Active Problem List   Diagnosis Date Noted   Encounter for pre-operative respiratory clearance 07/09/2023   Pseudarthrosis after fusion or arthrodesis 11/20/2022   Prediabetes 07/20/2021   HLD (hyperlipidemia) 07/20/2021   Protein calorie malnutrition (HCC) 04/10/2021   Tobacco abuse 04/10/2021   COPD (chronic obstructive pulmonary disease) (HCC) 01/25/2016   DDD (degenerative disc disease), lumbar 02/20/2015   Hyperthyroidism 02/20/2015    Social History   Tobacco Use   Smoking status: Every Day    Current packs/day: 0.50    Average packs/day: 0.3 packs/day for 31.6 years (8.3 ttl pk-yrs)    Types: Cigarettes    Start date: 03/13/1992    Last attempt to quit: 03/13/2022   Smokeless tobacco: Never   Tobacco comments:    10 cigarettes daily. Khj 10/14/2023   Substance Use Topics   Alcohol use: Yes    Alcohol/week: 0.0 standard drinks of alcohol    Comment: rare    No Known Allergies  Current Meds  Medication Sig   albuterol (VENTOLIN HFA) 108 (90 Base) MCG/ACT inhaler Inhale 1-2 puffs into the lungs every 6 (six) hours as needed for wheezing or shortness of breath.   aspirin EC 81 MG tablet Take 1 tablet (81 mg total) by mouth daily. Swallow whole.   B Complex-C (B-COMPLEX WITH VITAMIN C) tablet Take 1 tablet by mouth daily.   Budeson-Glycopyrrol-Formoterol  (BREZTRI AEROSPHERE) 160-9-4.8 MCG/ACT AERO Inhale 2 puffs into the lungs in the morning and at bedtime.   cilostazol (PLETAL) 100 MG tablet TAKE 1 TABLET BY MOUTH TWICE DAILY BEFORE A MEAL   COLLAGEN-VITAMIN C-BIOTIN PO Take 1 tablet by mouth daily.   cyclobenzaprine (FLEXERIL) 10 MG tablet Take 10 mg by mouth 2 (two) times daily as needed for muscle spasms.   Gelatin 600 MG CAPS Take 3 capsules by mouth daily.   HYDROcodone-acetaminophen (NORCO/VICODIN) 5-325 MG tablet Take 1 tablet by mouth every 4 (four) hours as needed.   potassium chloride (KLOR-CON) 10 MEQ tablet Take 1 tablet (10 mEq total) by mouth daily.   rosuvastatin (CRESTOR) 40 MG tablet Take 1 tablet (40 mg total) by mouth daily.    Immunization History  Administered Date(s) Administered   Fluad Quad(high Dose 65+) 07/26/2019, 08/02/2022   Influenza, High Dose Seasonal PF 08/25/2018, 07/10/2021   Influenza,inj,Quad PF,6+ Mos 09/11/2016   Influenza-Unspecified 07/17/2023   Moderna Covid-19 Vaccine Bivalent Booster 48yrs & up 07/10/2021   Moderna SARS-COV2 Booster Vaccination 08/21/2020, 08/02/2022   Moderna Sars-Covid-2 Vaccination 12/02/2019, 12/29/2019, 08/11/2020   Pneumococcal Conjugate-13 08/25/2018   Unspecified SARS-COV-2 Vaccination 07/17/2023        Objective:     BP 120/70 (BP Location: Right Arm, Cuff Size: Normal)   Pulse (!) 104   Temp (!) 96.9 F (36.1 C)   Ht 5\' 4"  (1.626 m)   Wt 99 lb (44.9  kg)   SpO2 96%   BMI 16.99 kg/m   SpO2: 96 % O2 Device: None (Room air)  GENERAL: HEAD: Normocephalic, atraumatic.  EYES: Pupils equal, round, reactive to light.  No scleral icterus.  MOUTH:  NECK: Supple. No thyromegaly. Trachea midline. No JVD.  No adenopathy. PULMONARY: Good air entry bilaterally.  No adventitious sounds. CARDIOVASCULAR: S1 and S2. Regular rate and rhythm.  ABDOMEN: MUSCULOSKELETAL: No joint deformity, no clubbing, no edema.  NEUROLOGIC:  SKIN: Intact,warm,dry. PSYCH:         Assessment & Plan:   No diagnosis found.  No orders of the defined types were placed in this encounter.   No orders of the defined types were placed in this encounter.      Advised if symptoms do not improve or worsen, to please contact office for sooner follow up or seek emergency care.    I spent xxx minutes of dedicated to the care of this patient on the date of this encounter to include pre-visit review of records, face-to-face time with the patient discussing conditions above, post visit ordering of testing, clinical documentation with the electronic health record, making appropriate referrals as documented, and communicating necessary findings to members of the patients care team.     C. Danice Goltz, MD Advanced Bronchoscopy PCCM [facility]    *This note was generated using voice recognition software/Dragon and/or AI transcription program.  Despite best efforts to proofread, errors can occur which can change the meaning. Any transcriptional errors that result from this process are unintentional and may not be fully corrected at the time of dictation.

## 2023-10-14 NOTE — Discharge Instructions (Signed)

## 2023-10-20 ENCOUNTER — Ambulatory Visit: Payer: Medicare Other | Admitting: Anesthesiology

## 2023-10-20 ENCOUNTER — Encounter
Admission: RE | Disposition: A | Discharge status: Home / Self Care | Payer: Self-pay | Source: Home / Self Care | Attending: Ophthalmology

## 2023-10-20 ENCOUNTER — Ambulatory Visit
Admission: RE | Admit: 2023-10-20 | Discharge: 2023-10-20 | Disposition: A | Discharge status: Home / Self Care | Payer: Medicare Other | Attending: Ophthalmology | Admitting: Ophthalmology

## 2023-10-20 ENCOUNTER — Encounter: Payer: Self-pay | Admitting: Ophthalmology

## 2023-10-20 ENCOUNTER — Other Ambulatory Visit: Payer: Self-pay

## 2023-10-20 DIAGNOSIS — I739 Peripheral vascular disease, unspecified: Secondary | ICD-10-CM | POA: Diagnosis not present

## 2023-10-20 DIAGNOSIS — H2512 Age-related nuclear cataract, left eye: Secondary | ICD-10-CM | POA: Insufficient documentation

## 2023-10-20 DIAGNOSIS — H269 Unspecified cataract: Secondary | ICD-10-CM | POA: Diagnosis not present

## 2023-10-20 DIAGNOSIS — J449 Chronic obstructive pulmonary disease, unspecified: Secondary | ICD-10-CM | POA: Insufficient documentation

## 2023-10-20 DIAGNOSIS — E785 Hyperlipidemia, unspecified: Secondary | ICD-10-CM | POA: Diagnosis not present

## 2023-10-20 DIAGNOSIS — F17210 Nicotine dependence, cigarettes, uncomplicated: Secondary | ICD-10-CM | POA: Insufficient documentation

## 2023-10-20 HISTORY — DX: Atherosclerosis of native arteries of extremities with intermittent claudication, bilateral legs: I70.213

## 2023-10-20 HISTORY — PX: CATARACT EXTRACTION W/PHACO: SHX586

## 2023-10-20 HISTORY — DX: Other ill-defined heart diseases: I51.89

## 2023-10-20 HISTORY — DX: Peripheral vascular disease, unspecified: I73.9

## 2023-10-20 HISTORY — DX: Chronic obstructive pulmonary disease, unspecified: J44.9

## 2023-10-20 HISTORY — DX: Atherosclerosis of aorta: I70.0

## 2023-10-20 SURGERY — PHACOEMULSIFICATION, CATARACT, WITH IOL INSERTION
Anesthesia: Monitor Anesthesia Care | Laterality: Left

## 2023-10-20 MED ORDER — FENTANYL CITRATE (PF) 100 MCG/2ML IJ SOLN
INTRAMUSCULAR | Status: AC
  Filled 2023-10-20: qty 2

## 2023-10-20 MED ORDER — SIGHTPATH DOSE#1 BSS IO SOLN
INTRAOCULAR | Status: DC | PRN
  Administered 2023-10-20: 1 mL

## 2023-10-20 MED ORDER — MIDAZOLAM HCL 2 MG/2ML IJ SOLN
INTRAMUSCULAR | Status: AC
  Filled 2023-10-20: qty 2

## 2023-10-20 MED ORDER — SIGHTPATH DOSE#1 BSS IO SOLN
INTRAOCULAR | Status: DC | PRN
  Administered 2023-10-20: 44 mL via OPHTHALMIC

## 2023-10-20 MED ORDER — SIGHTPATH DOSE#1 NA CHONDROIT SULF-NA HYALURON 40-17 MG/ML IO SOLN
INTRAOCULAR | Status: DC | PRN
  Administered 2023-10-20: 1 mL via INTRAOCULAR

## 2023-10-20 MED ORDER — MOXIFLOXACIN HCL 0.5 % OP SOLN
OPHTHALMIC | Status: DC | PRN
  Administered 2023-10-20: .2 mL via OPHTHALMIC

## 2023-10-20 MED ORDER — BRIMONIDINE TARTRATE-TIMOLOL 0.2-0.5 % OP SOLN
OPHTHALMIC | Status: DC | PRN
  Administered 2023-10-20: 1 [drp] via OPHTHALMIC

## 2023-10-20 MED ORDER — SIGHTPATH DOSE#1 BSS IO SOLN
INTRAOCULAR | Status: DC | PRN
  Administered 2023-10-20: 15 mL via INTRAOCULAR

## 2023-10-20 MED ORDER — FENTANYL CITRATE (PF) 100 MCG/2ML IJ SOLN
INTRAMUSCULAR | Status: DC | PRN
  Administered 2023-10-20 (×2): 50 ug via INTRAVENOUS

## 2023-10-20 MED ORDER — ARMC OPHTHALMIC DILATING DROPS
1.0000 | OPHTHALMIC | Status: DC | PRN
  Administered 2023-10-20 (×3): 1 via OPHTHALMIC

## 2023-10-20 MED ORDER — TETRACAINE HCL 0.5 % OP SOLN
1.0000 [drp] | OPHTHALMIC | Status: DC | PRN
  Administered 2023-10-20 (×3): 1 [drp] via OPHTHALMIC

## 2023-10-20 MED ORDER — TETRACAINE HCL 0.5 % OP SOLN
OPHTHALMIC | Status: AC
Start: 2023-10-20 — End: ?
  Filled 2023-10-20: qty 4

## 2023-10-20 MED ORDER — ARMC OPHTHALMIC DILATING DROPS
OPHTHALMIC | Status: AC
  Filled 2023-10-20: qty 0.5

## 2023-10-20 MED ORDER — MIDAZOLAM HCL 2 MG/2ML IJ SOLN
INTRAMUSCULAR | Status: DC | PRN
  Administered 2023-10-20 (×2): 1 mg via INTRAVENOUS

## 2023-10-20 SURGICAL SUPPLY — 10 items
ANGLE REVERSE CUT SHRT 25GA (CUTTER) ×1
CATARACT SUITE SIGHTPATH (MISCELLANEOUS) ×1
CYSTOTOME ANGL RVRS SHRT 25G (CUTTER) ×1 IMPLANT
FEE CATARACT SUITE SIGHTPATH (MISCELLANEOUS) ×1 IMPLANT
GLOVE BIOGEL PI IND STRL 8 (GLOVE) ×1 IMPLANT
GLOVE SURG LX STRL 8.0 MICRO (GLOVE) ×1 IMPLANT
LENS IOL TECNIS EYHANCE 21.5 (Intraocular Lens) IMPLANT
NDL FILTER BLUNT 18X1 1/2 (NEEDLE) ×1 IMPLANT
NEEDLE FILTER BLUNT 18X1 1/2 (NEEDLE) ×1
SYR 3ML LL SCALE MARK (SYRINGE) ×1 IMPLANT

## 2023-10-20 NOTE — Transfer of Care (Signed)
Immediate Anesthesia Transfer of Care Note  Patient: Pamela Haley  Procedure(s) Performed: CATARACT EXTRACTION PHACO AND INTRAOCULAR LENS PLACEMENT (IOC) LEFT 8.35 00:44.3 (Left)  Patient Location: PACU  Anesthesia Type: MAC  Level of Consciousness: awake, alert  and patient cooperative  Airway and Oxygen Therapy: Patient Spontanous Breathing and Patient connected to supplemental oxygen  Post-op Assessment: Post-op Vital signs reviewed, Patient's Cardiovascular Status Stable, Respiratory Function Stable, Patent Airway and No signs of Nausea or vomiting  Post-op Vital Signs: Reviewed and stable  Complications: No notable events documented.

## 2023-10-20 NOTE — H&P (Signed)
[facility]   Primary Care Physician:  Lorre Munroe, NP Ophthalmologist: Dr. Druscilla Brownie  Pre-Procedure History & Physical: HPI:  Pamela Haley is a 72 y.o. female here for cataract surgery.   Past Medical History:  Diagnosis Date   Abnormal EKG    HX OF PREVIOUS EKGS SHOWING POSSIBLE MI--BUT PT HAS NEVER HAD ANY HEART PROBLEMS--HAD BACK SURGERY AUG 2012 AT [facility] - NO HEART PROBLEMS   Atherosclerosis of native artery of both lower extremities with intermittent claudication (HCC)    Chronic cough    PT IS A SMOKER   COPD (chronic obstructive pulmonary disease) (HCC)    DDD (degenerative disc disease), lumbar    PAIN IN LOWER BACK AND DOWN RT HIP AND LEG--NUMBNESS, THROBBING, BURNING   Grade II diastolic dysfunction    Hyperthyroidism 02/20/2015   Occluded aorta (HCC)    PAD (peripheral artery disease) (HCC)    PAD (peripheral artery disease) (HCC)     Past Surgical History:  Procedure Laterality Date   ABDOMINAL AORTOGRAM W/LOWER EXTREMITY N/A 02/12/2023   Procedure: ABDOMINAL AORTOGRAM W/LOWER EXTREMITY;  Surgeon: Iran Ouch, MD;  Location: [facility] INVASIVE CV LAB;  Service: Cardiovascular;  Laterality: N/A;   BACK SURGERY  05/2011   lumbar surgery at [facility]   BILATERAL CARPAL TUNNEL RELEASE     C SECTIONS X 3      Prior to Admission medications   Medication Sig Start Date End Date Taking? Authorizing Provider  albuterol (VENTOLIN HFA) 108 (90 Base) MCG/ACT inhaler Inhale 1-2 puffs into the lungs every 6 (six) hours as needed for wheezing or shortness of breath. 12/09/22  Yes Salena Saner, MD  aspirin EC 81 MG tablet Take 1 tablet (81 mg total) by mouth daily. Swallow whole. 01/14/23  Yes Iran Ouch, MD  Budeson-Glycopyrrol-Formoterol (BREZTRI AEROSPHERE) 160-9-4.8 MCG/ACT AERO Inhale 2 puffs into the lungs in the morning and at bedtime. 07/11/23  Yes Salena Saner, MD  cilostazol (PLETAL) 100 MG tablet TAKE 1 TABLET BY MOUTH TWICE DAILY BEFORE A MEAL  06/11/23  Yes Victorino Sparrow, MD  cyclobenzaprine (FLEXERIL) 10 MG tablet Take 10 mg by mouth 2 (two) times daily as needed for muscle spasms. 05/23/21  Yes [provider]  Gelatin 600 MG CAPS Take 3 capsules by mouth daily. 11/22/22  Yes [provider]  HYDROcodone-acetaminophen (NORCO/VICODIN) 5-325 MG tablet Take 1 tablet by mouth every 4 (four) hours as needed. 08/05/23  Yes [provider]  potassium chloride (KLOR-CON) 10 MEQ tablet Take 1 tablet (10 mEq total) by mouth daily. 02/07/23  Yes Iran Ouch, MD  rosuvastatin (CRESTOR) 40 MG tablet Take 1 tablet (40 mg total) by mouth daily. 07/17/23 07/11/24 Yes Iran Ouch, MD    Allergies as of 10/14/2023   (No Known Allergies)    Family History  Problem Relation Age of Onset   Heart disease Mother    Heart attack Father    Heart disease Father    Stroke Maternal Grandmother    Diabetes Maternal Grandmother     Social History   Socioeconomic History   Marital status: Single    Spouse name: Not on file   Number of children: Not on file   Years of education: Not on file   Highest education level: Not on file  Occupational History   Not on file  Tobacco Use   Smoking status: Every Day    Current packs/day: 0.50    Average packs/day: 0.3  packs/day for 31.6 years (8.3 ttl pk-yrs)    Types: Cigarettes    Start date: 03/13/1992    Last attempt to quit: 03/13/2022   Smokeless tobacco: Never   Tobacco comments:    10 cigarettes daily. Khj 10/14/2023   Vaping Use   Vaping status: Never Used  Substance and Sexual Activity   Alcohol use: Yes    Alcohol/week: 0.0 standard drinks of alcohol    Comment: rare   Drug use: No   Sexual activity: Not Currently  Other Topics Concern   Not on file  Social History Narrative   Not on file   Social Drivers of Health   Financial Resource Strain: Low Risk  (12/27/2022)   Overall Financial Resource Strain (CARDIA)    Difficulty of Paying Living  Expenses: Not very hard  Food Insecurity: Low Risk  (08/04/2023)   Received from [facility]   Hunger Vital Sign    Worried About Running Out of Food in the Last Year: Never true    Ran Out of Food in the Last Year: Never true  Transportation Needs: No Transportation Needs (08/04/2023)   Received from [facility]    In the past 12 months, has lack of reliable transportation kept you from medical appointments, meetings, work or from getting things needed for daily living? : No  Physical Activity: Insufficiently Active (12/27/2022)   Exercise Vital Sign    Days of Exercise per Week: 3 days    Minutes of Exercise per Session: 30 min  Stress: No Stress Concern Present (12/27/2022)   [employer] of Occupational Health - Occupational Stress Questionnaire    Feeling of Stress : Not at all  Social Connections: Socially Isolated (12/27/2022)   Social Connection and Isolation Panel [NHANES]    Frequency of Communication with Friends and Family: Three times a week    Frequency of Social Gatherings with Friends and Family: Never    Attends Religious Services: Never    Database administrator or Organizations: No    Attends Banker Meetings: Never    Marital Status: Divorced  Catering manager Violence: Not At Risk (12/27/2022)   Humiliation, Afraid, Rape, and Kick questionnaire    Fear of Current or Ex-Partner: No    Emotionally Abused: No    Physically Abused: No    Sexually Abused: No    Review of Systems: See HPI, otherwise negative ROS  Physical Exam: BP 136/70   Pulse 100   Temp 99.1 F (37.3 C) (Temporal)   Resp 15   Ht 5\' 4"  (1.626 m)   Wt 45.3 kg   SpO2 94%   BMI 17.13 kg/m  General:   Alert, cooperative in NAD Head:  Normocephalic and atraumatic. Respiratory:  Normal work of breathing. Cardiovascular:  RRR  Impression/Plan: Pamela Haley is here for cataract surgery.  Risks, benefits, limitations, and alternatives regarding cataract  surgery have been reviewed with the patient.  Questions have been answered.  All parties agreeable.   Galen Manila, MD  10/20/2023, 9:01 AM

## 2023-10-20 NOTE — Anesthesia Postprocedure Evaluation (Signed)
Anesthesia Post Note  Patient: Pamela Haley  Procedure(s) Performed: CATARACT EXTRACTION PHACO AND INTRAOCULAR LENS PLACEMENT (IOC) LEFT 8.35 00:44.3 (Left)  Patient location during evaluation: PACU Anesthesia Type: MAC Level of consciousness: awake and alert Pain management: pain level controlled Vital Signs Assessment: post-procedure vital signs reviewed and stable Respiratory status: spontaneous breathing, nonlabored ventilation, respiratory function stable and patient connected to nasal cannula oxygen Cardiovascular status: stable and blood pressure returned to baseline Postop Assessment: no apparent nausea or vomiting Anesthetic complications: no   No notable events documented.   Last Vitals:  Vitals:   10/20/23 0923 10/20/23 0934  BP: 104/62 (!) 100/54  Pulse:  (!) 108  Resp:  20  Temp: 36.9 C   SpO2: 94% 92%    Last Pain:  Vitals:   10/20/23 0934  TempSrc:   PainSc: 0-No pain                 Marisue Humble

## 2023-10-20 NOTE — Anesthesia Preprocedure Evaluation (Addendum)
Anesthesia Evaluation    Airway Mallampati: III  TM Distance: >3 FB     Dental no notable dental hx.    Pulmonary Current Smoker and Patient abstained from smoking. States she is not on any oxygen at home          Cardiovascular   11-13-22  1. Left ventricular ejection fraction, by estimation, is 60 to 65%. The  left ventricle has normal function. The left ventricle has no regional  wall motion abnormalities. Left ventricular diastolic parameters are  consistent with Grade II diastolic  dysfunction (pseudonormalization).   2. Right ventricular systolic function is normal. The right ventricular  size is normal. There is normal pulmonary artery systolic pressure.   3. The mitral valve is normal in structure. No evidence of mitral valve  regurgitation.   4. The aortic valve was not well visualized. Aortic valve regurgitation  is not visualized. Aortic valve sclerosis is present, with no evidence of  aortic valve stenosis.   5. The inferior vena cava is normal in size with greater than 50%  respiratory variability, suggesting right atrial pressure of 3 mmHg.     Neuro/Psych    GI/Hepatic   Endo/Other    Renal/GU      Musculoskeletal   Abdominal   Peds  Hematology   Anesthesia Other Findings Previous cataract surgery 10-20-23 Dr. Juel Burrow anesthesiologist and Barbette Hair CRNA, had versed 2 mg IV and fentanyl 100 mcg IV  abnormal EKG  Chronic cough DDD (degenerative disc disease), lumbar Hyperthyroidism PAD (peripheral artery disease)  COPD (chronic obstructive pulmonary disease)  Atherosclerosis of native artery of both lower extremities with intermittent claudication (HCC)  Occluded aorta (HCC) Grade II diastolic dysfunction PAD (peripheral artery disease)    Reproductive/Obstetrics                              Anesthesia Physical Anesthesia Plan  ASA: 4  Anesthesia Plan: MAC    Post-op Pain Management:    Induction: Intravenous  PONV Risk Score and Plan:   Airway Management Planned: Natural Airway and Nasal Cannula  Additional Equipment:   Intra-op Plan:   Post-operative Plan:   Informed Consent: I have reviewed the patients History and Physical, chart, labs and discussed the procedure including the risks, benefits and alternatives for the proposed anesthesia with the patient or authorized representative who has indicated his/her understanding and acceptance.     Dental Advisory Given  Plan Discussed with: Anesthesiologist, CRNA and Surgeon  Anesthesia Plan Comments: (Patient consented for risks of anesthesia including but not limited to:  - adverse reactions to medications - damage to eyes, teeth, lips or other oral mucosa - nerve damage due to positioning  - sore throat or hoarseness - Damage to heart, brain, nerves, lungs, other parts of body or loss of life  Patient voiced understanding and assent.)         Anesthesia Quick Evaluation

## 2023-10-20 NOTE — Op Note (Signed)
PREOPERATIVE DIAGNOSIS:  Nuclear sclerotic cataract of the left eye.   POSTOPERATIVE DIAGNOSIS:  Nuclear sclerotic cataract of the left eye.   OPERATIVE PROCEDURE:ORPROCALL@   SURGEON:  Galen Manila, MD.   ANESTHESIA:  Anesthesiologist: Marisue Humble, MD CRNA: Barbette Hair, CRNA  1.      Managed anesthesia care. 2.     0.47ml of Shugarcaine was instilled following the paracentesis   COMPLICATIONS:  None.   TECHNIQUE:   Stop and chop   DESCRIPTION OF PROCEDURE:  The patient was examined and consented in the preoperative holding area where the aforementioned topical anesthesia was applied to the left eye and then brought back to the Operating Room where the left eye was prepped and draped in the usual sterile ophthalmic fashion and a lid speculum was placed. A paracentesis was created with the side port blade and the anterior chamber was filled with viscoelastic. A near clear corneal incision was performed with the steel keratome. A continuous curvilinear capsulorrhexis was performed with a cystotome followed by the capsulorrhexis forceps. Hydrodissection and hydrodelineation were carried out with BSS on a blunt cannula. The lens was removed in a stop and chop  technique and the remaining cortical material was removed with the irrigation-aspiration handpiece. The capsular bag was inflated with viscoelastic and the Technis [id number] lens was placed in the capsular bag without complication. The remaining viscoelastic was removed from the eye with the irrigation-aspiration handpiece. The wounds were hydrated. The anterior chamber was flushed with BSS and the eye was inflated to physiologic pressure. 0.67ml Vigamox was placed in the anterior chamber. The wounds were found to be water tight. The eye was dressed with Combigan. The patient was given protective glasses to wear throughout the day and a shield with which to sleep tonight. The patient was also given drops with which to begin a drop regimen  today and will follow-up with me in one day. Implant Name Type Inv. Item Serial No. Manufacturer Lot No. LRB No. Used Action  LENS IOL TECNIS EYHANCE 21.5 - [id number] Intraocular Lens LENS IOL TECNIS EYHANCE 21.5 [id number] [employer]  Left 1 Implanted    Procedure(s): CATARACT EXTRACTION PHACO AND INTRAOCULAR LENS PLACEMENT (IOC) LEFT 8.35 00:44.3 (Left)  Electronically signed: Galen Manila 10/20/2023 9:26 AM

## 2023-10-21 DIAGNOSIS — H2511 Age-related nuclear cataract, right eye: Secondary | ICD-10-CM | POA: Diagnosis not present

## 2023-10-22 ENCOUNTER — Encounter: Payer: Self-pay | Admitting: Ophthalmology

## 2023-10-28 ENCOUNTER — Encounter: Payer: Self-pay | Admitting: Pulmonary Disease

## 2023-10-31 NOTE — Discharge Instructions (Signed)

## 2023-11-04 ENCOUNTER — Ambulatory Visit
Admission: RE | Admit: 2023-11-04 | Discharge: 2023-11-04 | Disposition: A | Discharge status: Home / Self Care | Payer: Medicare Other | Attending: Ophthalmology | Admitting: Ophthalmology

## 2023-11-04 ENCOUNTER — Other Ambulatory Visit: Payer: Self-pay

## 2023-11-04 ENCOUNTER — Encounter: Payer: Self-pay | Admitting: Ophthalmology

## 2023-11-04 ENCOUNTER — Ambulatory Visit: Payer: Medicare Other | Admitting: Anesthesiology

## 2023-11-04 ENCOUNTER — Encounter
Admission: RE | Disposition: A | Discharge status: Home / Self Care | Payer: Self-pay | Source: Home / Self Care | Attending: Ophthalmology

## 2023-11-04 DIAGNOSIS — J449 Chronic obstructive pulmonary disease, unspecified: Secondary | ICD-10-CM | POA: Diagnosis not present

## 2023-11-04 DIAGNOSIS — I739 Peripheral vascular disease, unspecified: Secondary | ICD-10-CM | POA: Diagnosis not present

## 2023-11-04 DIAGNOSIS — F17210 Nicotine dependence, cigarettes, uncomplicated: Secondary | ICD-10-CM | POA: Insufficient documentation

## 2023-11-04 DIAGNOSIS — H2511 Age-related nuclear cataract, right eye: Secondary | ICD-10-CM | POA: Insufficient documentation

## 2023-11-04 HISTORY — PX: CATARACT EXTRACTION W/PHACO: SHX586

## 2023-11-04 SURGERY — PHACOEMULSIFICATION, CATARACT, WITH IOL INSERTION
Anesthesia: Monitor Anesthesia Care | Laterality: Right

## 2023-11-04 MED ORDER — BRIMONIDINE TARTRATE-TIMOLOL 0.2-0.5 % OP SOLN
OPHTHALMIC | Status: DC | PRN
  Administered 2023-11-04: 1 [drp] via OPHTHALMIC

## 2023-11-04 MED ORDER — SIGHTPATH DOSE#1 BSS IO SOLN
INTRAOCULAR | Status: DC | PRN
  Administered 2023-11-04: 15 mL via INTRAOCULAR

## 2023-11-04 MED ORDER — SIGHTPATH DOSE#1 NA CHONDROIT SULF-NA HYALURON 40-17 MG/ML IO SOLN
INTRAOCULAR | Status: DC | PRN
  Administered 2023-11-04: 1 mL via INTRAOCULAR

## 2023-11-04 MED ORDER — FENTANYL CITRATE (PF) 100 MCG/2ML IJ SOLN
INTRAMUSCULAR | Status: AC
  Filled 2023-11-04: qty 2

## 2023-11-04 MED ORDER — MOXIFLOXACIN HCL 0.5 % OP SOLN
OPHTHALMIC | Status: DC | PRN
  Administered 2023-11-04: .2 mL via OPHTHALMIC

## 2023-11-04 MED ORDER — SIGHTPATH DOSE#1 BSS IO SOLN
INTRAOCULAR | Status: DC | PRN
  Administered 2023-11-04: 50 mL via OPHTHALMIC

## 2023-11-04 MED ORDER — ARMC OPHTHALMIC DILATING DROPS
OPHTHALMIC | Status: AC
  Filled 2023-11-04: qty 0.5

## 2023-11-04 MED ORDER — TETRACAINE HCL 0.5 % OP SOLN
1.0000 [drp] | OPHTHALMIC | Status: DC | PRN
  Administered 2023-11-04 (×3): 1 [drp] via OPHTHALMIC

## 2023-11-04 MED ORDER — TETRACAINE HCL 0.5 % OP SOLN
OPHTHALMIC | Status: AC
  Filled 2023-11-04: qty 4

## 2023-11-04 MED ORDER — ARMC OPHTHALMIC DILATING DROPS
1.0000 | OPHTHALMIC | Status: DC | PRN
  Administered 2023-11-04 (×3): 1 via OPHTHALMIC

## 2023-11-04 MED ORDER — MIDAZOLAM HCL 2 MG/2ML IJ SOLN
INTRAMUSCULAR | Status: AC
Start: 2023-11-04 — End: ?
  Filled 2023-11-04: qty 2

## 2023-11-04 MED ORDER — SIGHTPATH DOSE#1 BSS IO SOLN
INTRAOCULAR | Status: DC | PRN
  Administered 2023-11-04: 2 mL

## 2023-11-04 MED ORDER — FENTANYL CITRATE (PF) 100 MCG/2ML IJ SOLN
INTRAMUSCULAR | Status: DC | PRN
  Administered 2023-11-04: 50 ug via INTRAVENOUS

## 2023-11-04 MED ORDER — MIDAZOLAM HCL 2 MG/2ML IJ SOLN
INTRAMUSCULAR | Status: DC | PRN
  Administered 2023-11-04: 2 mg via INTRAVENOUS

## 2023-11-04 SURGICAL SUPPLY — 10 items
ANGLE REVERSE CUT SHRT 25GA (CUTTER) ×1
CATARACT SUITE SIGHTPATH (MISCELLANEOUS) ×1
CYSTOTOME ANGL RVRS SHRT 25G (CUTTER) ×1 IMPLANT
FEE CATARACT SUITE SIGHTPATH (MISCELLANEOUS) ×1 IMPLANT
GLOVE BIOGEL PI IND STRL 8 (GLOVE) ×1 IMPLANT
GLOVE SURG LX STRL 8.0 MICRO (GLOVE) ×1 IMPLANT
LENS IOL TECNIS EYHANCE 21.0 (Intraocular Lens) IMPLANT
NDL FILTER BLUNT 18X1 1/2 (NEEDLE) ×1 IMPLANT
NEEDLE FILTER BLUNT 18X1 1/2 (NEEDLE) ×1
SYR 3ML LL SCALE MARK (SYRINGE) ×1 IMPLANT

## 2023-11-04 NOTE — Anesthesia Postprocedure Evaluation (Signed)
 Anesthesia Post Note  Patient: Pamela Haley  Procedure(s) Performed: CATARACT EXTRACTION PHACO AND INTRAOCULAR LENS PLACEMENT (IOC) RIGHT 7.52 00:50.5 (Right)  Patient location during evaluation: PACU Anesthesia Type: MAC Level of consciousness: awake and alert Pain management: pain level controlled Vital Signs Assessment: post-procedure vital signs reviewed and stable Respiratory status: spontaneous breathing, nonlabored ventilation, respiratory function stable and patient connected to nasal cannula oxygen  Cardiovascular status: stable and blood pressure returned to baseline Postop Assessment: no apparent nausea or vomiting Anesthetic complications: no   No notable events documented.   Last Vitals:  Vitals:   11/04/23 0830 11/04/23 0833  BP: 118/69 106/69  Pulse: (!) 114 (!) 113  Resp: (!) 23 (!) 24  Temp:  36.6 C  SpO2: 95% 94%    Last Pain:  Vitals:   11/04/23 0833  TempSrc:   PainSc: 0-No pain                 Donny JAYSON Mu

## 2023-11-04 NOTE — H&P (Signed)
 [facility]   Primary Care Physician:  Antonette Angeline ORN, NP Ophthalmologist: Dr. Jaye  Pre-Procedure History & Physical: HPI:  Pamela Haley is a 73 y.o. female here for cataract surgery.   Past Medical History:  Diagnosis Date   Abnormal EKG    HX OF PREVIOUS EKGS SHOWING POSSIBLE MI--BUT PT HAS NEVER HAD ANY HEART PROBLEMS--HAD BACK SURGERY AUG 2012 AT [facility] - NO HEART PROBLEMS   Atherosclerosis of native artery of both lower extremities with intermittent claudication (HCC)    Chronic cough    PT IS A SMOKER   COPD (chronic obstructive pulmonary disease) (HCC)    DDD (degenerative disc disease), lumbar    PAIN IN LOWER BACK AND DOWN RT HIP AND LEG--NUMBNESS, THROBBING, BURNING   Grade II diastolic dysfunction    Hyperthyroidism 02/20/2015   Occluded aorta (HCC)    PAD (peripheral artery disease) (HCC)    PAD (peripheral artery disease) (HCC)     Past Surgical History:  Procedure Laterality Date   ABDOMINAL AORTOGRAM W/LOWER EXTREMITY N/A 02/12/2023   Procedure: ABDOMINAL AORTOGRAM W/LOWER EXTREMITY;  Surgeon: Darron Deatrice LABOR, MD;  Location: [facility];  Service: Cardiovascular;  Laterality: N/A;   BACK SURGERY  05/2011   lumbar surgery at [facility]   BILATERAL CARPAL TUNNEL RELEASE     C SECTIONS X 3     CATARACT EXTRACTION W/PHACO Left 10/20/2023   Procedure: CATARACT EXTRACTION PHACO AND INTRAOCULAR LENS PLACEMENT (IOC) LEFT 8.35 00:44.3;  Surgeon: Jaye Fallow, MD;  Location: [facility];  Service: Ophthalmology;  Laterality: Left;    Prior to Admission medications   Medication Sig Start Date End Date Taking? Authorizing Provider  albuterol  (VENTOLIN  HFA) 108 (90 Base) MCG/ACT inhaler Inhale 1-2 puffs into the lungs every 6 (six) hours as needed for wheezing or shortness of breath. 12/09/22  Yes Tamea Dedra CROME, MD  aspirin  EC 81 MG tablet Take 1 tablet (81 mg total) by mouth daily. Swallow whole. 01/14/23  Yes Darron Deatrice LABOR, MD   Budeson-Glycopyrrol-Formoterol  (BREZTRI  AEROSPHERE) 160-9-4.8 MCG/ACT AERO Inhale 2 puffs into the lungs in the morning and at bedtime. 07/11/23  Yes Tamea Dedra CROME, MD  cilostazol  (PLETAL ) 100 MG tablet TAKE 1 TABLET BY MOUTH TWICE DAILY BEFORE A MEAL 06/11/23  Yes Robins, Joshua E, MD  cyclobenzaprine  (FLEXERIL ) 10 MG tablet Take 10 mg by mouth 2 (two) times daily as needed for muscle spasms. 05/23/21  Yes [provider]  Gelatin 600 MG CAPS Take 3 capsules by mouth daily. 11/22/22  Yes [provider]  potassium chloride  (KLOR-CON ) 10 MEQ tablet Take 1 tablet (10 mEq total) by mouth daily. 02/07/23  Yes Darron Deatrice LABOR, MD  rosuvastatin  (CRESTOR ) 40 MG tablet Take 1 tablet (40 mg total) by mouth daily. 07/17/23 07/11/24 Yes Darron Deatrice LABOR, MD  HYDROcodone -acetaminophen  (NORCO/VICODIN) 5-325 MG tablet Take 1 tablet by mouth every 4 (four) hours as needed. 08/05/23   [provider]    Allergies as of 10/14/2023   (No Known Allergies)    Family History  Problem Relation Age of Onset   Heart disease Mother    Heart attack Father    Heart disease Father    Stroke Maternal Grandmother    Diabetes Maternal Grandmother     Social History   Socioeconomic History   Marital status: Single    Spouse name: Not on file   Number of children: Not on file   Years of education: Not on file  Highest education level: Not on file  Occupational History   Not on file  Tobacco Use   Smoking status: Every Day    Current packs/day: 0.50    Average packs/day: 0.3 packs/day for 31.6 years (8.3 ttl pk-yrs)    Types: Cigarettes    Start date: 03/13/1992    Last attempt to quit: 03/13/2022   Smokeless tobacco: Never   Tobacco comments:    10 cigarettes daily. Khj 10/14/2023   Vaping Use   Vaping status: Never Used  Substance and Sexual Activity   Alcohol use: Yes    Alcohol/week: 0.0 standard drinks of alcohol    Comment: rare   Drug use: No   Sexual activity: Not  Currently  Other Topics Concern   Not on file  Social History Narrative   Not on file   Social Drivers of Health   Financial Resource Strain: Low Risk  (12/27/2022)   Overall Financial Resource Strain (CARDIA)    Difficulty of Paying Living Expenses: Not very hard  Food Insecurity: Low Risk  (08/04/2023)   Received from [facility]   Hunger Vital Sign    Worried About Running Out of Food in the Last Year: Never true    Ran Out of Food in the Last Year: Never true  Transportation Needs: No Transportation Needs (08/04/2023)   Received from [employer]    In the past 12 months, has lack of reliable transportation kept you from medical appointments, meetings, work or from getting things needed for daily living? : No  Physical Activity: Insufficiently Active (12/27/2022)   Exercise Vital Sign    Days of Exercise per Week: 3 days    Minutes of Exercise per Session: 30 min  Stress: No Stress Concern Present (12/27/2022)   [employer] of Occupational Health - Occupational Stress Questionnaire    Feeling of Stress : Not at all  Social Connections: Socially Isolated (12/27/2022)   Social Connection and Isolation Panel [NHANES]    Frequency of Communication with Friends and Family: Three times a week    Frequency of Social Gatherings with Friends and Family: Never    Attends Religious Services: Never    Database Administrator or Organizations: No    Attends Banker Meetings: Never    Marital Status: Divorced  Catering Manager Violence: Not At Risk (12/27/2022)   Humiliation, Afraid, Rape, and Kick questionnaire    Fear of Current or Ex-Partner: No    Emotionally Abused: No    Physically Abused: No    Sexually Abused: No    Review of Systems: See HPI, otherwise negative ROS  Physical Exam: BP 130/64   Pulse (!) 113   Temp 98 F (36.7 C) (Oral)   Resp 15   Ht 5' 4 (1.626 m)   Wt 44.5 kg   SpO2 95%   BMI 16.82 kg/m  General:   Alert, cooperative  in NAD Head:  Normocephalic and atraumatic. Respiratory:  Normal work of breathing. Cardiovascular:  RRR  Impression/Plan: Pamela Haley is here for cataract surgery.  Risks, benefits, limitations, and alternatives regarding cataract surgery have been reviewed with the patient.  Questions have been answered.  All parties agreeable.   Elsie Carmine, MD  11/04/2023, 8:08 AM

## 2023-11-04 NOTE — Transfer of Care (Signed)
 Immediate Anesthesia Transfer of Care Note  Patient: Pamela Haley  Procedure(s) Performed: CATARACT EXTRACTION PHACO AND INTRAOCULAR LENS PLACEMENT (IOC) RIGHT 7.52 00:50.5 (Right)  Patient Location: PACU  Anesthesia Type: MAC  Level of Consciousness: awake, alert  and patient cooperative  Airway and Oxygen  Therapy: Patient Spontanous Breathing and Patient connected to supplemental oxygen   Post-op Assessment: Post-op Vital signs reviewed, Patient's Cardiovascular Status Stable, Respiratory Function Stable, Patent Airway and No signs of Nausea or vomiting  Post-op Vital Signs: Reviewed and stable  Complications: No notable events documented.

## 2023-11-04 NOTE — Op Note (Signed)
 PREOPERATIVE DIAGNOSIS:  Nuclear sclerotic cataract of the right eye.   POSTOPERATIVE DIAGNOSIS:  Cataract   OPERATIVE PROCEDURE:ORPROCALL@   SURGEON:  Elsie Carmine, MD.   ANESTHESIA:  Anesthesiologist: Ola Donny BROCKS, MD CRNA: Levy Harvey, CRNA  1.      Managed anesthesia care. 2.      0.77ml of Shugarcaine was instilled in the eye following the paracentesis.   COMPLICATIONS:  None.   TECHNIQUE:   Stop and chop   DESCRIPTION OF PROCEDURE:  The patient was examined and consented in the preoperative holding area where the aforementioned topical anesthesia was applied to the right eye and then brought back to the Operating Room where the right eye was prepped and draped in the usual sterile ophthalmic fashion and a lid speculum was placed. A paracentesis was created with the side port blade and the anterior chamber was filled with viscoelastic. A near clear corneal incision was performed with the steel keratome. A continuous curvilinear capsulorrhexis was performed with a cystotome followed by the capsulorrhexis forceps. Hydrodissection and hydrodelineation were carried out with BSS on a blunt cannula. The lens was removed in a stop and chop  technique and the remaining cortical material was removed with the irrigation-aspiration handpiece. The capsular bag was inflated with viscoelastic and the Technis [id number]  lens was placed in the capsular bag without complication. The remaining viscoelastic was removed from the eye with the irrigation-aspiration handpiece. The wounds were hydrated. The anterior chamber was flushed with BSS and the eye was inflated to physiologic pressure. 0.23ml of Vigamox  was placed in the anterior chamber. The wounds were found to be water tight. The eye was dressed with Combigan . The patient was given protective glasses to wear throughout the day and a shield with which to sleep tonight. The patient was also given drops with which to begin a drop regimen today and will  follow-up with me in one day. Implant Name Type Inv. Item Serial No. Manufacturer Lot No. LRB No. Used Action  LENS IOL TECNIS EYHANCE 21.0 - [id number] Intraocular Lens LENS IOL TECNIS EYHANCE 21.0 [id number] [employer]  Right 1 Implanted   Procedure(s): CATARACT EXTRACTION PHACO AND INTRAOCULAR LENS PLACEMENT (IOC) RIGHT 7.52 00:50.5 (Right)  Electronically signed: Elsie Carmine 11/04/2023 8:28 AM

## 2023-11-05 ENCOUNTER — Encounter: Payer: Self-pay | Admitting: Ophthalmology

## 2023-12-02 ENCOUNTER — Ambulatory Visit: Payer: Self-pay | Admitting: *Deleted

## 2023-12-02 NOTE — Telephone Encounter (Signed)
 Reason for Disposition  Lots of coughing  Answer Assessment - Initial Assessment Questions 1. LOCATION: Where does it hurt?      fatigue 2. ONSET: When did the sinus pain start?  (e.g., hours, days)      2 days ago 3. SEVERITY: How bad is the pain?   (Scale 1-10; mild, moderate or severe)   - MILD (1-3): doesn't interfere with normal activities    - MODERATE (4-7): interferes with normal activities (e.g., work or school) or awakens from sleep   - SEVERE (8-10): excruciating pain and patient unable to do any normal activities        none 4. RECURRENT SYMPTOM: Have you ever had sinus problems before? If Yes, ask: When was the last time? and What happened that time?      Cough congestion- cough 5. NASAL CONGESTION: Is the nose blocked? If Yes, ask: Can you open it or must you breathe through your mouth?     clear 6. NASAL DISCHARGE: Do you have discharge from your nose? If so ask, What color?     clear 7. FEVER: Do you have a fever? If Yes, ask: What is it, how was it measured, and when did it start?      Low grade 8. OTHER SYMPTOMS: Do you have any other symptoms? (e.g., sore throat, cough, earache, difficulty breathing)     Cough, fatigue  Protocols used: Sinus Pain or Congestion-A-AH

## 2023-12-02 NOTE — Telephone Encounter (Signed)
  Chief Complaint: cough and congestion- effecting COPD Symptoms: cough, nasal congestion, fatigue, low grade temp Frequency: 2 days- grandchild has recently been sick Pertinent Negatives: Patient denies sore throat,pain, earache Disposition: [] ED /[] Urgent Care (no appt availability in office) / [x] Appointment(In office/virtual)/ []  [place] [facility]/ [] Home Care/ [] Refused Recommended Disposition /[] [place] [facility]/ []  Follow-up with PCP Additional Notes: Patient has been schedule for appointment to check COPD exaserbation with sinus/cough symptoms

## 2023-12-03 ENCOUNTER — Ambulatory Visit
Admission: RE | Admit: 2023-12-03 | Discharge: 2023-12-03 | Disposition: A | Discharge status: Home / Self Care | Payer: Medicare Other | Source: Ambulatory Visit | Attending: Internal Medicine | Admitting: Internal Medicine

## 2023-12-03 ENCOUNTER — Ambulatory Visit
Admission: RE | Admit: 2023-12-03 | Discharge: 2023-12-03 | Disposition: A | Discharge status: Home / Self Care | Payer: Medicare Other | Attending: Internal Medicine | Admitting: Internal Medicine

## 2023-12-03 ENCOUNTER — Ambulatory Visit: Payer: Medicare Other | Admitting: Internal Medicine

## 2023-12-03 ENCOUNTER — Encounter: Payer: Self-pay | Admitting: Internal Medicine

## 2023-12-03 VITALS — BP 112/68 | HR 99 | Temp 97.9°F | Ht 64.0 in | Wt 92.8 lb

## 2023-12-03 DIAGNOSIS — J441 Chronic obstructive pulmonary disease with (acute) exacerbation: Secondary | ICD-10-CM | POA: Insufficient documentation

## 2023-12-03 DIAGNOSIS — L89301 Pressure ulcer of unspecified buttock, stage 1: Secondary | ICD-10-CM

## 2023-12-03 DIAGNOSIS — R051 Acute cough: Secondary | ICD-10-CM | POA: Diagnosis not present

## 2023-12-03 DIAGNOSIS — R52 Pain, unspecified: Secondary | ICD-10-CM | POA: Diagnosis not present

## 2023-12-03 DIAGNOSIS — I739 Peripheral vascular disease, unspecified: Secondary | ICD-10-CM | POA: Insufficient documentation

## 2023-12-03 LAB — POCT URINE DIPSTICK
Bilirubin, UA: NEGATIVE
Blood, UA: NEGATIVE
Glucose, UA: NEGATIVE mg/dL
Ketones, POC UA: NEGATIVE mg/dL
Leukocytes, UA: NEGATIVE
Nitrite, UA: NEGATIVE
POC PROTEIN,UA: 100 — AB
Spec Grav, UA: 1.02 (ref 1.010–1.025)
Urobilinogen, UA: 0.2 U/dL
pH, UA: 6.5 (ref 5.0–8.0)

## 2023-12-03 LAB — POC COVID19/FLU A&B COMBO
Covid Antigen, POC: NEGATIVE
Influenza A Antigen, POC: NEGATIVE
Influenza B Antigen, POC: NEGATIVE

## 2023-12-03 MED ORDER — AZITHROMYCIN 250 MG PO TABS: ORAL_TABLET | ORAL | 0 refills | Status: DC

## 2023-12-03 MED ORDER — PREDNISONE 10 MG PO TABS: ORAL_TABLET | ORAL | 0 refills | Status: DC

## 2023-12-03 NOTE — Progress Notes (Signed)
 Subjective:    Patient ID: Pamela Haley, female    DOB: July 02, 1951, 73 y.o.   MRN: [id number]  HPI  Patient presents to clinic today with complaint of headache, scratchy throat, cough and shortness of breath.  She reports this started 3 to 4 days ago however her daughter feels like it started 1 week ago.  She denies runny nose, nasal congestion, ear pain, sore throat, nausea, vomiting.  She reports she had 1 episode of diarrhea with fecal incontinence.  Her daughter feels like she has been running a fever but they have not taken it.  She denies chills or bodyaches.  Her daughter has given her ibuprofen  OTC with minimal relief of symptoms.  She has had sick contacts diagnosed with a viral respiratory infection.  She has a history of COPD managed on Breztri  and albuterol  however she is not currently on any oxygen .  Her daughter also reports a pressure ulcer to her midline buttocks.  She reports they have been putting baby Butt cream on this.  They have not noticed any redness, swelling, drainage or purulent odor.  Review of Systems   Past Medical History:  Diagnosis Date   Abnormal EKG    HX OF PREVIOUS EKGS SHOWING POSSIBLE MI--BUT PT HAS NEVER HAD ANY HEART PROBLEMS--HAD BACK SURGERY AUG 2012 AT [facility] - NO HEART PROBLEMS   Atherosclerosis of native artery of both lower extremities with intermittent claudication (HCC)    Chronic cough    PT IS A SMOKER   COPD (chronic obstructive pulmonary disease) (HCC)    DDD (degenerative disc disease), lumbar    PAIN IN LOWER BACK AND DOWN RT HIP AND LEG--NUMBNESS, THROBBING, BURNING   Grade II diastolic dysfunction    Hyperthyroidism 02/20/2015   Occluded aorta (HCC)    PAD (peripheral artery disease) (HCC)    PAD (peripheral artery disease) (HCC)     Current Outpatient Medications  Medication Sig Dispense Refill   albuterol  (VENTOLIN  HFA) 108 (90 Base) MCG/ACT inhaler Inhale 1-2 puffs into the lungs every 6 (six) hours as needed for wheezing  or shortness of breath. 18 g 2   aspirin  EC 81 MG tablet Take 1 tablet (81 mg total) by mouth daily. Swallow whole. 90 tablet 3   Budeson-Glycopyrrol-Formoterol  (BREZTRI  AEROSPHERE) 160-9-4.8 MCG/ACT AERO Inhale 2 puffs into the lungs in the morning and at bedtime.     cilostazol  (PLETAL ) 100 MG tablet TAKE 1 TABLET BY MOUTH TWICE DAILY BEFORE A MEAL 60 tablet 5   cyclobenzaprine  (FLEXERIL ) 10 MG tablet Take 10 mg by mouth 2 (two) times daily as needed for muscle spasms.     Gelatin 600 MG CAPS Take 3 capsules by mouth daily.     HYDROcodone -acetaminophen  (NORCO/VICODIN) 5-325 MG tablet Take 1 tablet by mouth every 4 (four) hours as needed.     potassium chloride  (KLOR-CON ) 10 MEQ tablet Take 1 tablet (10 mEq total) by mouth daily. 90 tablet 3   rosuvastatin  (CRESTOR ) 40 MG tablet Take 1 tablet (40 mg total) by mouth daily. 90 tablet 3   No current facility-administered medications for this visit.    No Known Allergies  Family History  Problem Relation Age of Onset   Heart disease Mother    Heart attack Father    Heart disease Father    Stroke Maternal Grandmother    Diabetes Maternal Grandmother     Social History   Socioeconomic History   Marital status: Single    Spouse name: Not on  file   Number of children: Not on file   Years of education: Not on file   Highest education level: Not on file  Occupational History   Not on file  Tobacco Use   Smoking status: Every Day    Current packs/day: 0.50    Average packs/day: 0.3 packs/day for 31.7 years (8.4 ttl pk-yrs)    Types: Cigarettes    Start date: 03/13/1992    Last attempt to quit: 03/13/2022   Smokeless tobacco: Never   Tobacco comments:    10 cigarettes daily. Khj 10/14/2023   Vaping Use   Vaping status: Never Used  Substance and Sexual Activity   Alcohol use: Yes    Alcohol/week: 0.0 standard drinks of alcohol    Comment: rare   Drug use: No   Sexual activity: Not Currently  Other Topics Concern   Not on file   Social History Narrative   Not on file   Social Drivers of Health   Financial Resource Strain: Low Risk  (12/27/2022)   Overall Financial Resource Strain (CARDIA)    Difficulty of Paying Living Expenses: Not very hard  Food Insecurity: Low Risk  (08/04/2023)   Received from [facility]   Hunger Vital Sign    Worried About Running Out of Food in the Last Year: Never true    Ran Out of Food in the Last Year: Never true  Transportation Needs: No Transportation Needs (08/04/2023)   Received from [employer]    In the past 12 months, has lack of reliable transportation kept you from medical appointments, meetings, work or from getting things needed for daily living? : No  Physical Activity: Insufficiently Active (12/27/2022)   Exercise Vital Sign    Days of Exercise per Week: 3 days    Minutes of Exercise per Session: 30 min  Stress: No Stress Concern Present (12/27/2022)   [employer] of Occupational Health - Occupational Stress Questionnaire    Feeling of Stress : Not at all  Social Connections: Socially Isolated (12/27/2022)   Social Connection and Isolation Panel [NHANES]    Frequency of Communication with Friends and Family: Three times a week    Frequency of Social Gatherings with Friends and Family: Never    Attends Religious Services: Never    Database Administrator or Organizations: No    Attends Banker Meetings: Never    Marital Status: Divorced  Catering Manager Violence: Not At Risk (12/27/2022)   Humiliation, Afraid, Rape, and Kick questionnaire    Fear of Current or Ex-Partner: No    Emotionally Abused: No    Physically Abused: No    Sexually Abused: No     Constitutional: Pt reports headache, fever. Denies malaise, fatigue, or abrupt weight changes.  Skin: Patient's daughter reports skin tear to midline buttocks. HEENT: Pt reports scratchy throat. Denies eye pain, eye redness, ear pain, ringing in the ears, wax buildup, runny nose,  nasal congestion, bloody nose, or sore throat. Respiratory: Patient reports cough and shortness of breath.  Denies difficulty breathing, or sputum production.   Cardiovascular: Denies chest pain, chest tightness, palpitations or swelling in the hands or feet.  Gastrointestinal: Pt reports diarrhea. Denies abdominal pain, bloating, constipation, or blood in the stool.  GU: Denies urgency, frequency, pain with urination, burning sensation, blood in urine, odor or discharge. Musculoskeletal: Denies decrease in range of motion, difficulty with gait, muscle pain or joint pain and swelling.  Neurological: Denies dizziness, difficulty with memory,  difficulty with speech or problems with balance and coordination.    No other specific complaints in a complete review of systems (except as listed in HPI above).      Objective:   Physical Exam  BP 112/68 (BP Location: Left Arm, Patient Position: Sitting, Cuff Size: Normal)   Pulse 99   Temp 97.9 F (36.6 C)   Ht 5' 4 (1.626 m)   Wt 92 lb 12.8 oz (42.1 kg)   SpO2 (!) 87%   BMI 15.93 kg/m   Wt Readings from Last 3 Encounters:  11/04/23 98 lb (44.5 kg)  10/20/23 99 lb 12.8 oz (45.3 kg)  10/14/23 99 lb (44.9 kg)    General: Appears her stated age, chronically ill appearing, in NAD. Skin: Warm, dry and intact.  Her color is slightly gray/yellow.  No rashes noted.  Punctate pressure ulcer noted of the superior gluteal fold. HEENT: Head: normal shape and size, no sinus tenderness noted; Eyes: sclera white, no icterus, conjunctiva pink, PERRLA and EOMs intact;  Nose: mucosa pink and moist, septum midline; Throat/Mouth: Teeth present, mucosa erythema this and moist, + few day, no exudate, lesions or ulcerations noted.  Neck: No adenopathy noted. Cardiovascular: Normal rate and rhythm.  Pulmonary/Chest: Normal effort and diminished breath sounds. No respiratory distress. No wheezes, rales or ronchi noted.  Musculoskeletal: Gait slow and steady  without device. Neurological: Alert and oriented.   BMET    Component Value Date/Time   NA 141 07/16/2023 0000   K 4.4 07/16/2023 0000   CL 103 07/16/2023 0000   CO2 24 07/16/2023 0000   GLUCOSE 95 07/16/2023 0000   GLUCOSE 107 (H) 02/12/2023 0859   BUN 10 07/16/2023 0000   CREATININE 0.85 07/16/2023 0000   CALCIUM  9.3 07/16/2023 0000   GFRNONAA >60 02/12/2023 0859   GFRAA 93 07/20/2020 1541    Lipid Panel     Component Value Date/Time   CHOL 173 07/16/2023 0000   TRIG 65 07/16/2023 0000   HDL 67 07/16/2023 0000   CHOLHDL 2.6 07/16/2023 0000   LDLCALC 93 07/16/2023 0000    CBC    Component Value Date/Time   WBC 8.7 07/16/2023 0000   WBC 12.3 (H) 02/05/2023 1050   RBC 5.22 07/16/2023 0000   RBC 5.35 (H) 02/05/2023 1050   HGB 16.2 (H) 07/16/2023 0000   HCT 48.8 (H) 07/16/2023 0000   PLT 279 07/16/2023 0000   MCV 94 07/16/2023 0000   MCH 31.0 07/16/2023 0000   MCH 30.5 02/05/2023 1050   MCHC 33.2 07/16/2023 0000   MCHC 32.5 02/05/2023 1050   RDW 12.5 07/16/2023 0000   LYMPHSABS 4.0 05/15/2012 1525   MONOABS 0.8 05/15/2012 1525   EOSABS 0.3 05/15/2012 1525   BASOSABS 0.1 05/15/2012 1525    Hgb A1C Lab Results  Component Value Date   HGBA1C 5.9 (H) 10/09/2022            Assessment & Plan:   COPD exacerbation:  Rapid flu: Negative Rapid COVID: Negative Her daughter wanted a urinalysis checked although she was not having symptoms, urinalysis normal Continue Breztri  and albuterol  Stat chest x-ray for further evaluation of symptoms Rx for Pred taper x 6 days Rx for azithromycin  250 mg x 5 days Recommend ER evaluation if symptoms persist or worsen  Pressure ulcer buttocks, stage I:  Continue Desitin or Essence Merle Butt cream OTC  Schedule an appointment for follow-up of chronic conditions Angeline Laura, NP

## 2023-12-03 NOTE — Patient Instructions (Signed)
 Chronic Obstructive Pulmonary Disease Exacerbation  Chronic obstructive pulmonary disease (COPD) is a long-term (chronic) lung problem. When you have COPD, it can feel harder to breathe in or out. COPD exacerbation is a flare-up of symptoms when breathing gets worse and more treatment may be needed. Without treatment, flare-ups can be life-threatening. If they happen often, your lungs can become more damaged. What are the causes? Not taking your usual COPD medicines as told by your health care provider. A cold or the flu, which can cause infection in your lungs. Being exposed to things that make your breathing worse, such as: Smoke. Air pollution. Fumes. Dust. Allergies. Weather changes. What are the signs or symptoms? Symptoms do not get better or get worse even if you take your medicines as told by your provider. Symptoms may include: More shortness of breath. You may only be able to speak one or two words at a time. More coughing or mucus from your lungs. More wheezing or chest tightness. Being more tired and having less energy. Confusion. How is this diagnosed? This condition is diagnosed based on: Symptoms that get worse. Your medical history. A physical exam. You may also have tests, including: A chest X-ray. Blood or mucus tests. How is this treated? You may be able to stay home or you may need to go to the hospital. Treatment may include: Taking medicines. These may include: Inhalers. These have medicines in them that you breathe in. These may be more of what you already take or they may be new. Steroids. These reduce inflammation in the airways. These may be inhaled, taken by mouth, or given in an IV. Antibiotics. These treat infection. Using oxygen. Using a device to help you clear mucus. Follow these instructions at home: Medicines Take your medicines only as told by your provider. If you were given antibiotics or steroids, take them as told by your provider. Do  not stop taking them even if you start to feel better. Lifestyle Several times a day, wash your hands with soap and water for at least 20 seconds. If you cannot use soap and water, use hand sanitizer. This may help keep you from getting an infection. Avoid being around crowds or people who are sick. Do not smoke or use any products that contain nicotine or tobacco. If you need help quitting, ask your provider. Return to your normal activities when your provider says that it's safe. Use breathing methods to control your stress and catch your breath. How is this prevented? Follow your COPD action plan. The action plan tells you what to do if you're feeling good and what to do when you start feeling worse. Discuss the plan often with your provider. Make sure you get all the shots, also called vaccines, that your provider recommends. Ask your provider about a flu shot and a pneumonia shot. Use oxygen therapy if told by your provider. If you need home oxygen therapy, ask your provider how often to check your oxygen level with a device called an oximeter. Keep all follow-up visits to review your COPD action plan. Your provider will want to check on your condition often to keep you healthy and out of the hospital. Contact a health care provider if: Your COPD symptoms get worse. You have a fever or chills. You have trouble doing daily activities. You have trouble breathing even when you are resting. Get help right away if: You are short of breath and cannot: Talk in full sentences. Do normal activities. You have chest  pain. You feel confused. These symptoms may be an emergency. Call 911 right away. Do not wait to see if the symptoms will go away. Do not drive yourself to the hospital. This information is not intended to replace advice given to you by your health care provider. Make sure you discuss any questions you have with your health care provider. Document Revised: 07/17/2023 Document  Reviewed: 12/30/2022 Elsevier Patient Education  2024 [employer].

## 2023-12-09 ENCOUNTER — Ambulatory Visit: Payer: Medicare Other | Admitting: Internal Medicine

## 2023-12-10 ENCOUNTER — Ambulatory Visit: Payer: Medicare Other | Admitting: Internal Medicine

## 2023-12-10 ENCOUNTER — Encounter: Payer: Self-pay | Admitting: Internal Medicine

## 2023-12-10 VITALS — BP 110/64 | HR 112 | Ht 64.0 in | Wt 95.6 lb

## 2023-12-10 DIAGNOSIS — Z681 Body mass index (BMI) 19 or less, adult: Secondary | ICD-10-CM

## 2023-12-10 DIAGNOSIS — J431 Panlobular emphysema: Secondary | ICD-10-CM

## 2023-12-10 DIAGNOSIS — R636 Underweight: Secondary | ICD-10-CM | POA: Insufficient documentation

## 2023-12-10 DIAGNOSIS — M5136 Other intervertebral disc degeneration, lumbar region with discogenic back pain only: Secondary | ICD-10-CM | POA: Diagnosis not present

## 2023-12-10 DIAGNOSIS — E0590 Thyrotoxicosis, unspecified without thyrotoxic crisis or storm: Secondary | ICD-10-CM

## 2023-12-10 DIAGNOSIS — R7303 Prediabetes: Secondary | ICD-10-CM | POA: Diagnosis not present

## 2023-12-10 DIAGNOSIS — I739 Peripheral vascular disease, unspecified: Secondary | ICD-10-CM | POA: Diagnosis not present

## 2023-12-10 DIAGNOSIS — E782 Mixed hyperlipidemia: Secondary | ICD-10-CM

## 2023-12-10 DIAGNOSIS — E440 Moderate protein-calorie malnutrition: Secondary | ICD-10-CM

## 2023-12-10 LAB — POCT GLYCOSYLATED HEMOGLOBIN (HGB A1C): Hemoglobin A1C: 6 % — AB (ref 4.0–5.6)

## 2023-12-10 MED ORDER — PREDNISONE 10 MG PO TABS: ORAL_TABLET | ORAL | 0 refills | Status: DC

## 2023-12-10 NOTE — Assessment & Plan Note (Signed)
Encourage high protein, high calorie diet

## 2023-12-10 NOTE — Patient Instructions (Signed)

## 2023-12-10 NOTE — Progress Notes (Signed)
HPI:  Pt presents to the clinic today for follow up chronic conditions.  COPD: Pt reports chronic cough and SOB. She does continue to smoke. She is using stiolto and albuterol as prescribed. PFT's from 06/2021 reviewed. She follows with pulmonology.  DDD, Lumbar: s/p back surgery. Managed with ibuprofen, hydrocodone and flexeril. She stretches daily. She does not follow with orthopedics or neurosurgery.  Hyperthyroidism: Not medicated. She does not follow with endocrinology.   HLD with aortic atherosclerosis, PAD: her last LDL was 93, triglycerides 65, 06/2023. She denies claudication. She denies myalgias on rosuvastatin, pletal and aspirin. She follows with cardiology.  Prediabetes: Her last A1C was 5.9%, 09/2022. She is not taking any oral diabetic medication at this time. She does not check her sugars.  Polycythemia: Her last H/H was 16.2/48.8, 06/2023. She does smoke. She does not follow with hematology.  Past Medical History:  Diagnosis Date   Abnormal EKG    HX OF PREVIOUS EKGS SHOWING POSSIBLE MI--BUT PT HAS NEVER HAD ANY HEART PROBLEMS--HAD BACK SURGERY AUG 2012 AT [facility] - NO HEART PROBLEMS   Atherosclerosis of native artery of both lower extremities with intermittent claudication (HCC)    Chronic cough    PT IS A SMOKER   COPD (chronic obstructive pulmonary disease) (HCC)    DDD (degenerative disc disease), lumbar    PAIN IN LOWER BACK AND DOWN RT HIP AND LEG--NUMBNESS, THROBBING, BURNING   Grade II diastolic dysfunction    Hyperthyroidism 02/20/2015   Occluded aorta (HCC)    PAD (peripheral artery disease) (HCC)    PAD (peripheral artery disease) (HCC)     Current Outpatient Medications  Medication Sig Dispense Refill   albuterol (VENTOLIN HFA) 108 (90 Base) MCG/ACT inhaler Inhale 1-2 puffs into the lungs every 6 (six) hours as needed for wheezing or shortness of breath. 18 g 2   aspirin EC 81 MG tablet Take 1 tablet (81 mg total) by mouth daily. Swallow whole. 90 tablet 3    azithromycin (ZITHROMAX) 250 MG tablet Take 2 tabs today, then 1 tab daily x 4 days 6 tablet 0   Budeson-Glycopyrrol-Formoterol (BREZTRI AEROSPHERE) 160-9-4.8 MCG/ACT AERO Inhale 2 puffs into the lungs in the morning and at bedtime.     cilostazol (PLETAL) 100 MG tablet TAKE 1 TABLET BY MOUTH TWICE DAILY BEFORE A MEAL 60 tablet 5   cyclobenzaprine (FLEXERIL) 10 MG tablet Take 10 mg by mouth 2 (two) times daily as needed for muscle spasms.     Gelatin 600 MG CAPS Take 3 capsules by mouth daily.     HYDROcodone-acetaminophen (NORCO/VICODIN) 5-325 MG tablet Take 1 tablet by mouth every 4 (four) hours as needed.     potassium chloride (KLOR-CON) 10 MEQ tablet Take 1 tablet (10 mEq total) by mouth daily. 90 tablet 3   predniSONE (DELTASONE) 10 MG tablet Take 6 tabs on day 1, 5 tabs on day 2, 4 tabs on day 3, 3 tabs on day 4, 2 tabs on day 5, 1 tab on day 6 21 tablet 0   rosuvastatin (CRESTOR) 40 MG tablet Take 1 tablet (40 mg total) by mouth daily. 90 tablet 3   No current facility-administered medications for this visit.    No Known Allergies  Family History  Problem Relation Age of Onset   Heart disease Mother    Heart attack Father    Heart disease Father    Stroke Maternal Grandmother    Diabetes Maternal Grandmother     Social History  Socioeconomic History   Marital status: Single    Spouse name: Not on file   Number of children: Not on file   Years of education: Not on file   Highest education level: Associate degree: occupational, technical, or vocational program  Occupational History   Not on file  Tobacco Use   Smoking status: Every Day    Current packs/day: 0.50    Average packs/day: 0.3 packs/day for 31.7 years (8.4 ttl pk-yrs)    Types: Cigarettes    Start date: 03/13/1992    Last attempt to quit: 03/13/2022   Smokeless tobacco: Never   Tobacco comments:    10 cigarettes daily. Khj 10/14/2023   Vaping Use   Vaping status: Never Used  Substance and Sexual  Activity   Alcohol use: Yes    Alcohol/week: 0.0 standard drinks of alcohol    Comment: rare   Drug use: No   Sexual activity: Not Currently  Other Topics Concern   Not on file  Social History Narrative   Not on file   Social Drivers of Health   Financial Resource Strain: Low Risk  (12/05/2023)   Overall Financial Resource Strain (CARDIA)    Difficulty of Paying Living Expenses: Not very hard  Food Insecurity: Food Insecurity Present (12/05/2023)   Hunger Vital Sign    Worried About Running Out of Food in the Last Year: Sometimes true    Ran Out of Food in the Last Year: Never true  Transportation Needs: No Transportation Needs (12/05/2023)   PRAPARE - Administrator, Civil Service (Medical): No    Lack of Transportation (Non-Medical): No  Physical Activity: Insufficiently Active (12/05/2023)   Exercise Vital Sign    Days of Exercise per Week: 1 day    Minutes of Exercise per Session: 10 min  Stress: No Stress Concern Present (12/05/2023)   [employer] of Occupational Health - Occupational Stress Questionnaire    Feeling of Stress : Only a little  Social Connections: Socially Isolated (12/05/2023)   Social Connection and Isolation Panel [NHANES]    Frequency of Communication with Friends and Family: More than three times a week    Frequency of Social Gatherings with Friends and Family: Twice a week    Attends Religious Services: Never    Database administrator or Organizations: No    Attends Banker Meetings: Never    Marital Status: Divorced  Catering manager Violence: Not At Risk (12/27/2022)   Humiliation, Afraid, Rape, and Kick questionnaire    Fear of Current or Ex-Partner: No    Emotionally Abused: No    Physically Abused: No    Sexually Abused: No    Subjective:   Review of Systems:   Constitutional: Denies fever, malaise, fatigue, headache or abrupt weight changes.  HEENT: Denies eye pain, eye redness, ear pain, ringing in the ears, wax  buildup, runny nose, nasal congestion, bloody nose, or sore throat. Respiratory: Pt reports chronic cough, shortness of breath. Denies difficulty breathing, or sputum production.   Cardiovascular: Denies chest pain, chest tightness, palpitations or swelling in the hands or feet.  Gastrointestinal: Denies abdominal pain, bloating, constipation, diarrhea or blood in the stool.  GU: Denies urgency, frequency, pain with urination, burning sensation, blood in urine, odor or discharge. Musculoskeletal: Pt reports chronic back pain. Denies decrease in range of motion, difficulty with gait, or joint swelling.  Skin: Denies redness, rashes, lesions or ulcercations.  Neurological: Denies dizziness, difficulty with memory, difficulty with speech or  problems with balance and coordination.  Psych: . Denies anxiety and depression, SI/HI.  No other specific complaints in a complete review of systems (except as listed in HPI above).  Objective:  PE:  BP 110/64 (BP Location: Left Arm, Patient Position: Sitting, Cuff Size: Normal)   Pulse (!) 112   Ht 5\' 4"  (1.626 m)   Wt 95 lb 9.6 oz (43.4 kg)   SpO2 90%   BMI 16.41 kg/m    Wt Readings from Last 3 Encounters:  12/03/23 92 lb 12.8 oz (42.1 kg)  11/04/23 98 lb (44.5 kg)  10/20/23 99 lb 12.8 oz (45.3 kg)    General: Appears her stated age, chronically ill appearing, in NAD. Skin: Warm, dry and intact. No rashes noted. HEENT: Head: normal shape and size; Eyes: sclera white, no icterus, conjunctiva pink, PERRLA and EOMs intact;  Neck: Neck supple, trachea midline. No masses, lumps present.  Cardiovascular: Tachycardic with normal rhythm. S1,S2 noted.  No murmur, rubs or gallops noted. No JVD or BLE edema.  Pulmonary/Chest: Normal effort and coarse breath sounds with bilateral expiratory wheezing noted. No respiratory distress. No rales or ronchi noted.  Abdomen: Soft and nontender.  Musculoskeletal: Gait slow and steady without use of  cane. Neurological: Alert and oriented. Coordination normal.  Psychiatric: Mood is normal. Behavior is normal. Judgment and thought content normal.    BMET    Component Value Date/Time   NA 141 07/16/2023 0000   K 4.4 07/16/2023 0000   CL 103 07/16/2023 0000   CO2 24 07/16/2023 0000   GLUCOSE 95 07/16/2023 0000   GLUCOSE 107 (H) 02/12/2023 0859   BUN 10 07/16/2023 0000   CREATININE 0.85 07/16/2023 0000   CALCIUM 9.3 07/16/2023 0000   GFRNONAA >60 02/12/2023 0859   GFRAA 93 07/20/2020 1541    Lipid Panel     Component Value Date/Time   CHOL 173 07/16/2023 0000   TRIG 65 07/16/2023 0000   HDL 67 07/16/2023 0000   CHOLHDL 2.6 07/16/2023 0000   LDLCALC 93 07/16/2023 0000    CBC    Component Value Date/Time   WBC 8.7 07/16/2023 0000   WBC 12.3 (H) 02/05/2023 1050   RBC 5.22 07/16/2023 0000   RBC 5.35 (H) 02/05/2023 1050   HGB 16.2 (H) 07/16/2023 0000   HCT 48.8 (H) 07/16/2023 0000   PLT 279 07/16/2023 0000   MCV 94 07/16/2023 0000   MCH 31.0 07/16/2023 0000   MCH 30.5 02/05/2023 1050   MCHC 33.2 07/16/2023 0000   MCHC 32.5 02/05/2023 1050   RDW 12.5 07/16/2023 0000   LYMPHSABS 4.0 05/15/2012 1525   MONOABS 0.8 05/15/2012 1525   EOSABS 0.3 05/15/2012 1525   BASOSABS 0.1 05/15/2012 1525    Hgb A1C Lab Results  Component Value Date   HGBA1C 5.9 (H) 10/09/2022      Assessment and Plan:      Nicki Reaper, NP

## 2023-12-10 NOTE — Assessment & Plan Note (Signed)
Lipid profile reviewed Encouraged her to consume a low-fat diet Continue rosuvastatin

## 2023-12-10 NOTE — Assessment & Plan Note (Signed)
Encouraged smoking cessation Continue Pletal and aspirin prescribed by cardiology

## 2023-12-10 NOTE — Assessment & Plan Note (Signed)
POCT A1c today Encourage low-carb diet and exercise for weight loss

## 2023-12-10 NOTE — Assessment & Plan Note (Signed)
Encouraged smoking cessation Continue Stiolto and Albuterol

## 2023-12-10 NOTE — Assessment & Plan Note (Signed)
Encourage regular stretching and core strengthening Continue Ibuprofen, Flexeril and hydrocodone as needed

## 2023-12-10 NOTE — Assessment & Plan Note (Signed)
TSH and Free T4 reviewed

## 2023-12-19 ENCOUNTER — Other Ambulatory Visit: Payer: Self-pay | Admitting: Cardiovascular Disease

## 2023-12-25 ENCOUNTER — Telehealth: Payer: Self-pay | Admitting: Cardiovascular Disease

## 2023-12-25 MED ORDER — ROSUVASTATIN CALCIUM 40 MG PO TABS: 40.0000 mg | ORAL_TABLET | Freq: Every day | ORAL | 0 refills | Status: DC

## 2023-12-25 NOTE — Telephone Encounter (Signed)
 Requested Prescriptions   Signed Prescriptions Disp Refills   rosuvastatin (CRESTOR) 40 MG tablet 30 tablet 0    Sig: Take 1 tablet (40 mg total) by mouth daily.    Authorizing Provider: Lorine Bears A    Ordering User: Kendrick Fries

## 2023-12-25 NOTE — Telephone Encounter (Signed)
*  STAT* If patient is at the pharmacy, call can be transferred to refill team.   1. Which medications need to be refilled? (please list name of each medication and dose if known) rosuvastatin (CRESTOR) 40 MG tablet    2. Would you like to learn more about the convenience, safety, & potential cost savings by using the [facility]?      3. Are you open to using the [facility] (Type [facility]. ).   4. Which pharmacy/location (including street and city if local pharmacy) is medication to be sent to?[facility] [address] [place] - [address]    5. Do they need a 30 day or 90 day supply? 30

## 2024-01-01 ENCOUNTER — Telehealth: Payer: Self-pay

## 2024-01-01 NOTE — Telephone Encounter (Signed)
 Copied from CRM [id number]. Topic: Clinical - Medication Question >> Jan 01, 2024  4:27 PM Fuller Mandril wrote: Reason for CRM: Patient called states she thought appt for tomorrow was in office with Rene Kocher but its virtual with Nurse. Requesting that Rene Kocher give her a call at her earliest convenience. 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 Will be unavailable during AWV but can reach her before or after that appt. Thank You

## 2024-01-02 ENCOUNTER — Ambulatory Visit (INDEPENDENT_AMBULATORY_CARE_PROVIDER_SITE_OTHER): Payer: Medicare Other

## 2024-01-02 DIAGNOSIS — Z0000 Encounter for general adult medical examination without abnormal findings: Secondary | ICD-10-CM | POA: Diagnosis not present

## 2024-01-02 DIAGNOSIS — Z1211 Encounter for screening for malignant neoplasm of colon: Secondary | ICD-10-CM

## 2024-01-02 NOTE — Progress Notes (Signed)
 Subjective:   Pamela Haley is a 73 y.o. who presents for a Medicare Wellness preventive visit.  Visit Complete: Virtual I connected with  Pamela Haley on 01/02/24 by a audio enabled telemedicine application and verified that I am speaking with the correct person using two identifiers.  Patient Location: Home  Provider Location: Office/Clinic  I discussed the limitations of evaluation and management by telemedicine. The patient expressed understanding and agreed to proceed.  Vital Signs: Because this visit was a virtual/telehealth visit, some criteria may be missing or patient reported. Any vitals not documented were not able to be obtained and vitals that have been documented are patient reported.  VideoDeclined- This patient declined Librarian, academic. Therefore the visit was completed with audio only.  AWV Questionnaire: No: Patient Medicare AWV questionnaire was not completed prior to this visit.  Cardiac Risk Factors include: advanced age (>30men, >23 women);dyslipidemia;sedentary lifestyle;smoking/ tobacco exposure     Objective:    There were no vitals filed for this visit. There is no height or weight on file to calculate BMI.     01/02/2024   11:31 AM 11/04/2023    7:35 AM 10/20/2023    8:28 AM 02/12/2023    8:53 AM 12/27/2022   10:01 AM 09/25/2019    4:42 PM 05/17/2017   12:43 PM  Advanced Directives  Does Patient Have a Medical Advance Directive? No No No No No No No  Would patient like information on creating a medical advance directive? No - Patient declined No - Patient declined No - Patient declined No - Patient declined No - Patient declined      Current Medications (verified) Outpatient Encounter Medications as of 01/02/2024  Medication Sig   albuterol (VENTOLIN HFA) 108 (90 Base) MCG/ACT inhaler Inhale 1-2 puffs into the lungs every 6 (six) hours as needed for wheezing or shortness of breath.   aspirin EC 81 MG tablet Take 1  tablet (81 mg total) by mouth daily. Swallow whole.   Budeson-Glycopyrrol-Formoterol (BREZTRI AEROSPHERE) 160-9-4.8 MCG/ACT AERO Inhale 2 puffs into the lungs in the morning and at bedtime.   cilostazol (PLETAL) 100 MG tablet TAKE 1 TABLET BY MOUTH TWICE DAILY BEFORE A MEAL   cyclobenzaprine (FLEXERIL) 10 MG tablet Take 10 mg by mouth 2 (two) times daily as needed for muscle spasms.   potassium chloride (KLOR-CON) 10 MEQ tablet Take 1 tablet (10 mEq total) by mouth daily.   rosuvastatin (CRESTOR) 40 MG tablet Take 1 tablet (40 mg total) by mouth daily.   Gelatin 600 MG CAPS Take 3 capsules by mouth daily. (Patient not taking: Reported on 01/02/2024)   HYDROcodone-acetaminophen (NORCO/VICODIN) 5-325 MG tablet Take 1 tablet by mouth every 4 (four) hours as needed. (Patient not taking: Reported on 01/02/2024)   predniSONE (DELTASONE) 10 MG tablet Take 6 tabs on day 1, 5 tabs on day 2, 4 tabs on day 3, 3 tabs on day 4, 2 tabs on day 5, 1 tab on day 6 (Patient not taking: Reported on 01/02/2024)   No facility-administered encounter medications on file as of 01/02/2024.    Allergies (verified) Patient has no known allergies.   History: Past Medical History:  Diagnosis Date   Abnormal EKG    HX OF PREVIOUS EKGS SHOWING POSSIBLE MI--BUT PT HAS NEVER HAD ANY HEART PROBLEMS--HAD BACK SURGERY AUG 2012 AT [facility] - NO HEART PROBLEMS   Atherosclerosis of native artery of both lower extremities with intermittent claudication (HCC)    Chronic  cough    PT IS A SMOKER   COPD (chronic obstructive pulmonary disease) (HCC)    DDD (degenerative disc disease), lumbar    PAIN IN LOWER BACK AND DOWN RT HIP AND LEG--NUMBNESS, THROBBING, BURNING   Grade II diastolic dysfunction    Hyperthyroidism 02/20/2015   Occluded aorta (HCC)    PAD (peripheral artery disease) (HCC)    PAD (peripheral artery disease) (HCC)    Past Surgical History:  Procedure Laterality Date   ABDOMINAL AORTOGRAM W/LOWER EXTREMITY N/A 02/12/2023    Procedure: ABDOMINAL AORTOGRAM W/LOWER EXTREMITY;  Surgeon: Iran Ouch, MD;  Location: [facility];  Service: Cardiovascular;  Laterality: N/A;   BACK SURGERY  05/2011   lumbar surgery at [facility]   BILATERAL CARPAL TUNNEL RELEASE     C SECTIONS X 3     CATARACT EXTRACTION W/PHACO Left 10/20/2023   Procedure: CATARACT EXTRACTION PHACO AND INTRAOCULAR LENS PLACEMENT (IOC) LEFT 8.35 00:44.3;  Surgeon: Pamela Manila, MD;  Location: [facility];  Service: Ophthalmology;  Laterality: Left;   CATARACT EXTRACTION W/PHACO Right 11/04/2023   Procedure: CATARACT EXTRACTION PHACO AND INTRAOCULAR LENS PLACEMENT (IOC) RIGHT 7.52 00:50.5;  Surgeon: Pamela Manila, MD;  Location: [facility];  Service: Ophthalmology;  Laterality: Right;   Family History  Problem Relation Age of Onset   Heart disease Mother    Heart attack Father    Heart disease Father    Stroke Maternal Grandmother    Diabetes Maternal Grandmother    Social History   Socioeconomic History   Marital status: Single    Spouse name: Not on file   Number of children: Not on file   Years of education: Not on file   Highest education level: Associate degree: occupational, Scientist, product/process development, or vocational program  Occupational History   Not on file  Tobacco Use   Smoking status: Every Day    Current packs/day: 0.50    Average packs/day: 0.3 packs/day for 31.8 years (8.4 ttl pk-yrs)    Types: Cigarettes    Start date: 03/13/1992    Last attempt to quit: 03/13/2022   Smokeless tobacco: Never   Tobacco comments:    10 cigarettes daily. Khj 10/14/2023   Vaping Use   Vaping status: Never Used  Substance and Sexual Activity   Alcohol use: Yes    Alcohol/week: 0.0 standard drinks of alcohol    Comment: rare   Drug use: No   Sexual activity: Not Currently  Other Topics Concern   Not on file  Social History Narrative   Not on file   Social Drivers of Health   Financial Resource Strain: Low Risk  (01/02/2024)    Overall Financial Resource Strain (CARDIA)    Difficulty of Paying Living Expenses: Not hard at all  Food Insecurity: No Food Insecurity (01/02/2024)   Hunger Vital Sign    Worried About Running Out of Food in the Last Year: Never true    Ran Out of Food in the Last Year: Never true  Recent Concern: Food Insecurity - Food Insecurity Present (12/05/2023)   Hunger Vital Sign    Worried About Running Out of Food in the Last Year: Sometimes true    Ran Out of Food in the Last Year: Never true  Transportation Needs: No Transportation Needs (01/02/2024)   PRAPARE - Administrator, Civil Service (Medical): No    Lack of Transportation (Non-Medical): No  Physical Activity: Insufficiently Active (01/02/2024)   Exercise Vital Sign  Days of Exercise per Week: 2 days    Minutes of Exercise per Session: 20 min  Stress: No Stress Concern Present (01/02/2024)   [employer] of Occupational Health - Occupational Stress Questionnaire    Feeling of Stress : Only a little  Social Connections: Socially Isolated (01/02/2024)   Social Connection and Isolation Panel [NHANES]    Frequency of Communication with Friends and Family: More than three times a week    Frequency of Social Gatherings with Friends and Family: More than three times a week    Attends Religious Services: Never    Database administrator or Organizations: No    Attends Banker Meetings: Never    Marital Status: Divorced    Tobacco Counseling Ready to quit: Not Answered Counseling given: Not Answered Tobacco comments: 10 cigarettes daily. Khj 10/14/2023     Clinical Intake:  Pre-visit preparation completed: Yes  Pain : No/denies pain     BMI - recorded: 16.3 Nutritional Status: BMI <19  Underweight Nutritional Risks: None Diabetes: No  How often do you need to have someone help you when you read instructions, pamphlets, or other written materials from your doctor or pharmacy?: 1 -  Never  Interpreter Needed?: No  Information entered by :: Kennedy Bucker, LPN   Activities of Daily Living     01/02/2024   11:32 AM 11/04/2023    7:26 AM  In your present state of health, do you have any difficulty performing the following activities:  Hearing? 0 0  Vision? 0 0  Difficulty concentrating or making decisions? 0 0  Walking or climbing stairs? 1   Comment BACK PAIN   Dressing or bathing? 0   Doing errands, shopping? 0   Preparing Food and eating ? N   Using the Toilet? N   In the past six months, have you accidently leaked urine? N   Do you have problems with loss of bowel control? N   Managing your Medications? N   Managing your Finances? N   Housekeeping or managing your Housekeeping? N     Patient Care Team: Lorre Munroe, NP as PCP - General (Internal Medicine) Iran Ouch, MD as PCP - Cardiology (Cardiology) Salena Saner, MD as Consulting Physician (Pulmonary Disease) [facility], [facility])  Indicate any recent Medical Services you may have received from other than [facility] providers in the past year (date may be approximate).     Assessment:   This is a routine wellness examination for Sammi.  Hearing/Vision screen Hearing Screening - Comments:: NO AIDS Vision Screening - Comments:: READERS, HAD CATARACT SGY- [facility]   Goals Addressed             This Visit's Progress    DIET - INCREASE WATER INTAKE         Depression Screen     01/02/2024   11:28 AM 12/27/2022    9:59 AM 09/26/2022    1:50 PM 07/20/2021    2:10 PM 07/20/2020    2:36 PM 08/25/2018    2:39 PM 08/25/2018    2:37 PM  PHQ 2/9 Scores  PHQ - 2 Score 3 0 3 1 4  0 0  PHQ- 9 Score 5 0 6 1 11       Fall Risk     01/02/2024   11:32 AM 12/27/2022   10:02 AM 09/26/2022    1:51 PM 07/20/2021    2:10 PM 07/20/2020    2:38 PM  Fall Risk   Falls in the past year? 0 0 0 0 0  Number falls in past yr: 0 0  0   Injury with Fall? 0 0 0 0   Risk for fall due to :  No Fall Risks No Fall Risks  No Fall Risks   Follow up Falls prevention discussed;Falls evaluation completed Falls prevention discussed;Falls evaluation completed  Falls evaluation completed     MEDICARE RISK AT HOME:  Medicare Risk at Home Any stairs in or around the home?: Yes If so, are there any without handrails?: No Home free of loose throw rugs in walkways, pet beds, electrical cords, etc?: Yes Adequate lighting in your home to reduce risk of falls?: Yes Life alert?: No Use of a cane, walker or w/c?: No Grab bars in the bathroom?: Yes Shower chair or bench in shower?: Yes Elevated toilet seat or a handicapped toilet?: Yes  TIMED UP AND GO:  Was the test performed?  No  Cognitive Function: 6CIT completed        01/02/2024   11:35 AM 12/27/2022   10:07 AM  6CIT Screen  What Year? 0 points 0 points  What month? 0 points 0 points  What time? 0 points 0 points  Count back from 20 0 points 0 points  Months in reverse 2 points 0 points  Repeat phrase 0 points 2 points  Total Score 2 points 2 points    Immunizations Immunization History  Administered Date(s) Administered   Fluad Quad(high Dose 65+) 07/26/2019, 08/02/2022   Fluad Trivalent(High Dose 65+) 07/04/2023   Influenza, High Dose Seasonal PF 08/25/2018, 07/10/2021   Influenza,inj,Quad PF,6+ Mos 09/11/2016   Influenza-Unspecified 07/17/2023   Moderna Covid-19 Vaccine Bivalent Booster 14yrs & up 07/10/2021   Moderna SARS-COV2 Booster Vaccination 08/21/2020, 08/02/2022   Moderna Sars-Covid-2 Vaccination 12/02/2019, 12/29/2019, 08/11/2020   Pneumococcal Conjugate-13 08/25/2018   Unspecified SARS-COV-2 Vaccination 07/17/2023    Screening Tests Health Maintenance  Topic Date Due   MAMMOGRAM  Never done   DTaP/Tdap/Td (1 - Tdap) Never done   Zoster Vaccines- Shingrix (1 of 2) Never done   Fecal DNA (Cologuard)  Never done   DEXA SCAN  Never done   Pneumonia Vaccine 72+ Years old (2 of 2 - PPSV23 or PCV20)  10/20/2018   COVID-19 Vaccine (6 - 2024-25 season) 09/11/2023   Medicare Annual Wellness (AWV)  01/01/2025   INFLUENZA VACCINE  Completed   Hepatitis C Screening  Addressed   HPV VACCINES  Aged Out    Health Maintenance  Health Maintenance Due  Topic Date Due   MAMMOGRAM  Never done   DTaP/Tdap/Td (1 - Tdap) Never done   Zoster Vaccines- Shingrix (1 of 2) Never done   Fecal DNA (Cologuard)  Never done   DEXA SCAN  Never done   Pneumonia Vaccine 35+ Years old (2 of 2 - PPSV23 or PCV20) 10/20/2018   COVID-19 Vaccine (6 - 2024-25 season) 09/11/2023   Health Maintenance Items Addressed: DECLINED REFERRALS  MAMMOGRAM & BONE DENSITY SCAN REFERRAL FOR COLOGUARD SENT  Additional Screening:  Vision Screening: Recommended annual ophthalmology exams for early detection of glaucoma and other disorders of the eye.  Dental Screening: Recommended annual dental exams for proper oral hygiene  Community Resource Referral / Chronic Care Management: CRR required this visit?  No   CCM required this visit?  No     Plan:     I have personally reviewed and noted the following in the patient's chart:  Medical and social history Use of alcohol, tobacco or illicit drugs  Current medications and supplements including opioid prescriptions. Patient is not currently taking opioid prescriptions. Functional ability and status Nutritional status Physical activity Advanced directives List of other physicians Hospitalizations, surgeries, and ER visits in previous 12 months Vitals Screenings to include cognitive, depression, and falls Referrals and appointments  In addition, I have reviewed and discussed with patient certain preventive protocols, quality metrics, and best practice recommendations. A written personalized care plan for preventive services as well as general preventive health recommendations were provided to patient.     Hal Hope, LPN   11/02/1094   After Visit Summary:  (MyChart) Due to this being a telephonic visit, the after visit summary with patients personalized plan was offered to patient via MyChart   Notes:  REFERRAL SENT FOR COLOGUARD

## 2024-01-02 NOTE — Patient Instructions (Addendum)
 Pamela Haley , Thank you for taking time to come for your Medicare Wellness Visit. I appreciate your ongoing commitment to your health goals. Please review the following plan we discussed and let me know if I can assist you in the future.   Referrals/Orders/Follow-Ups/Clinician Recommendations: REFERRAL FOR COLOGUARD SENT   This is a list of the screening recommended for you and due dates:  Health Maintenance  Topic Date Due   Mammogram  Never done   DTaP/Tdap/Td vaccine (1 - Tdap) Never done   Zoster (Shingles) Vaccine (1 of 2) Never done   Cologuard (Stool DNA test)  Never done   DEXA scan (bone density measurement)  Never done   Pneumonia Vaccine (2 of 2 - PPSV23 or PCV20) 10/20/2018   COVID-19 Vaccine (6 - 2024-25 season) 09/11/2023   Medicare Annual Wellness Visit  01/01/2025   Flu Shot  Completed   Hepatitis C Screening  Addressed   HPV Vaccine  Aged Out    Advanced directives: (ACP Link)Information on Advanced Care Planning can be found at [facility] of [employer] Advance Health Care Directives Advance Health Care Directives. [email or web address]   Next Medicare Annual Wellness Visit scheduled for next year: Yes   01/07/25 @ 10:50 AM BY PHONE

## 2024-01-02 NOTE — Telephone Encounter (Signed)
 Advised patient per Rene Kocher. She states she will call back next week to schedule.

## 2024-01-02 NOTE — Telephone Encounter (Signed)
 She will need an appointment to discuss this.  Virtual is fine.

## 2024-01-07 ENCOUNTER — Other Ambulatory Visit: Payer: Self-pay | Admitting: Vascular Surgery

## 2024-01-08 ENCOUNTER — Telehealth: Payer: Self-pay

## 2024-01-08 NOTE — Telephone Encounter (Signed)
 Request call back in May. She has recently been sick and treated with antibiotics. States she has lot going on in April. Request call in May to discuss LCS program.

## 2024-01-14 ENCOUNTER — Telehealth: Payer: Self-pay | Admitting: Cardiovascular Disease

## 2024-01-14 LAB — COLOGUARD: COLOGUARD: POSITIVE — AB

## 2024-01-14 MED ORDER — ROSUVASTATIN CALCIUM 40 MG PO TABS: 40.0000 mg | ORAL_TABLET | Freq: Every day | ORAL | 0 refills | Status: DC

## 2024-01-14 NOTE — Telephone Encounter (Signed)
*  STAT* If patient is at the pharmacy, call can be transferred to refill team.   1. Which medications need to be refilled? (please list name of each medication and dose if known) rosuvastatin (CRESTOR) 40 MG tablet   2. Which pharmacy/location (including street and city if local pharmacy) is medication to be sent to? [facility] [address], [place] - [address]   3. Do they need a 30 day or 90 day supply? 90

## 2024-01-15 ENCOUNTER — Encounter: Payer: Self-pay | Admitting: Internal Medicine

## 2024-01-15 DIAGNOSIS — R195 Other fecal abnormalities: Secondary | ICD-10-CM

## 2024-01-15 NOTE — Telephone Encounter (Signed)
 See message below

## 2024-01-15 NOTE — Telephone Encounter (Signed)
 Yes, will need appt to discuss. She has no history of depression on her medication list

## 2024-01-19 ENCOUNTER — Encounter: Payer: Self-pay | Admitting: Internal Medicine

## 2024-01-19 ENCOUNTER — Other Ambulatory Visit: Payer: Self-pay

## 2024-01-19 ENCOUNTER — Ambulatory Visit (INDEPENDENT_AMBULATORY_CARE_PROVIDER_SITE_OTHER): Admitting: Internal Medicine

## 2024-01-19 ENCOUNTER — Telehealth: Payer: Self-pay

## 2024-01-19 VITALS — BP 110/60 | Ht 64.0 in | Wt 94.0 lb

## 2024-01-19 DIAGNOSIS — F419 Anxiety disorder, unspecified: Secondary | ICD-10-CM | POA: Diagnosis not present

## 2024-01-19 DIAGNOSIS — Z681 Body mass index (BMI) 19 or less, adult: Secondary | ICD-10-CM

## 2024-01-19 DIAGNOSIS — R195 Other fecal abnormalities: Secondary | ICD-10-CM

## 2024-01-19 DIAGNOSIS — F32A Depression, unspecified: Secondary | ICD-10-CM | POA: Diagnosis not present

## 2024-01-19 DIAGNOSIS — Z1211 Encounter for screening for malignant neoplasm of colon: Secondary | ICD-10-CM

## 2024-01-19 DIAGNOSIS — R636 Underweight: Secondary | ICD-10-CM | POA: Diagnosis not present

## 2024-01-19 MED ORDER — NA SULFATE-K SULFATE-MG SULF 17.5-3.13-1.6 GM/177ML PO SOLN: 1.0000 | Freq: Once | ORAL | 0 refills | Status: AC

## 2024-01-19 MED ORDER — VENLAFAXINE HCL ER 37.5 MG PO CP24: 37.5000 mg | ORAL_CAPSULE | Freq: Every day | ORAL | 1 refills | Status: DC

## 2024-01-19 NOTE — Assessment & Plan Note (Signed)
 Will trial venlafaxine 37.5 mg daily Support offered

## 2024-01-19 NOTE — Progress Notes (Signed)
 Gastroenterology Pre-Procedure Review  Request Date: 03/04/24 Requesting Physician: Dr. Allegra Lai  PATIENT REVIEW QUESTIONS: The patient responded to the following health history questions as indicated:    1. Are you having any GI issues?  Positive cologuard, no previous colonoscopy this is her 1st. 2. Do you have a personal history of Polyps? no 3. Do you have a family history of Colon Cancer or Polyps? no 4. Diabetes Mellitus? no 5. Joint replacements in the past 12 months?no 6. Major health problems in the past 3 months?no 7. Any artificial heart valves, MVP, or defibrillator?no    MEDICATIONS & ALLERGIES:    Patient reports the following regarding taking any anticoagulation/antiplatelet therapy:   Plavix, Coumadin, Eliquis, Xarelto, Lovenox, Pradaxa, Brilinta, or Effient? no Aspirin? no  Patient confirms/reports the following medications:  Current Outpatient Medications  Medication Sig Dispense Refill   albuterol (VENTOLIN HFA) 108 (90 Base) MCG/ACT inhaler Inhale 1-2 puffs into the lungs every 6 (six) hours as needed for wheezing or shortness of breath. 18 g 2   aspirin EC 81 MG tablet Take 1 tablet (81 mg total) by mouth daily. Swallow whole. 90 tablet 3   Budeson-Glycopyrrol-Formoterol (BREZTRI AEROSPHERE) 160-9-4.8 MCG/ACT AERO Inhale 2 puffs into the lungs in the morning and at bedtime.     cilostazol (PLETAL) 100 MG tablet TAKE 1 TABLET BY MOUTH TWICE DAILY BEFORE A MEAL 60 tablet 0   cyclobenzaprine (FLEXERIL) 10 MG tablet Take 10 mg by mouth 2 (two) times daily as needed for muscle spasms.     Gelatin 600 MG CAPS Take 3 capsules by mouth daily. (Patient not taking: Reported on 01/02/2024)     HYDROcodone-acetaminophen (NORCO/VICODIN) 5-325 MG tablet Take 1 tablet by mouth every 4 (four) hours as needed. (Patient not taking: Reported on 01/02/2024)     potassium chloride (KLOR-CON) 10 MEQ tablet Take 1 tablet (10 mEq total) by mouth daily. 90 tablet 3   predniSONE (DELTASONE) 10 MG  tablet Take 6 tabs on day 1, 5 tabs on day 2, 4 tabs on day 3, 3 tabs on day 4, 2 tabs on day 5, 1 tab on day 6 (Patient not taking: Reported on 01/02/2024) 21 tablet 0   rosuvastatin (CRESTOR) 40 MG tablet Take 1 tablet (40 mg total) by mouth daily. 30 tablet 0   No current facility-administered medications for this visit.    Patient confirms/reports the following allergies:  No Known Allergies  No orders of the defined types were placed in this encounter.   AUTHORIZATION INFORMATION Primary Insurance: 1D#: Group #:  Secondary Insurance: 1D#: Group #:  SCHEDULE INFORMATION: Date: 03/01/24 Time: Location: [facility]

## 2024-01-19 NOTE — Progress Notes (Signed)
 HPI:  Discussed the use of AI scribe software for clinical note transcription with the patient, who gave verbal consent to proceed.   Pamela Haley is a 73 year old female who presents with depression and anxiety related to family stress.  She has been experiencing depression and anxiety for the past six to seven months, which she attributes to stress from her daughter and two grandsons moving in with her last August. The situation has been challenging, particularly due to her daughter's controlling nature. Her older grandson has since moved out, but her daughter and nine-year-old grandson remain, contributing to ongoing stress.  She has a history of being treated for anxiety and depression approximately three years ago when she was caring for her mother, who was also difficult to deal with. At that time, she was prescribed venlafaxine, which she felt helped 'take the edge off.'  Her sleep is restless, as she wakes up two to three times a night. She is concerned about being underweight despite eating. She occasionally uses flexeril for back pain, which helps her sleep when the pain is severe.  Her sister, who lives in [place], has been a significant support, providing emotional assistance when needed.       Past Medical History:  Diagnosis Date   Abnormal EKG    HX OF PREVIOUS EKGS SHOWING POSSIBLE MI--BUT PT HAS NEVER HAD ANY HEART PROBLEMS--HAD BACK SURGERY AUG 2012 AT [facility] - NO HEART PROBLEMS   Atherosclerosis of native artery of both lower extremities with intermittent claudication (HCC)    Chronic cough    PT IS A SMOKER   COPD (chronic obstructive pulmonary disease) (HCC)    DDD (degenerative disc disease), lumbar    PAIN IN LOWER BACK AND DOWN RT HIP AND LEG--NUMBNESS, THROBBING, BURNING   Grade II diastolic dysfunction    Hyperthyroidism 02/20/2015   Occluded aorta (HCC)    PAD (peripheral artery disease) (HCC)    PAD (peripheral artery disease) (HCC)     Current  Outpatient Medications  Medication Sig Dispense Refill   albuterol (VENTOLIN HFA) 108 (90 Base) MCG/ACT inhaler Inhale 1-2 puffs into the lungs every 6 (six) hours as needed for wheezing or shortness of breath. 18 g 2   aspirin EC 81 MG tablet Take 1 tablet (81 mg total) by mouth daily. Swallow whole. 90 tablet 3   Budeson-Glycopyrrol-Formoterol (BREZTRI AEROSPHERE) 160-9-4.8 MCG/ACT AERO Inhale 2 puffs into the lungs in the morning and at bedtime.     cilostazol (PLETAL) 100 MG tablet TAKE 1 TABLET BY MOUTH TWICE DAILY BEFORE A MEAL 60 tablet 0   cyclobenzaprine (FLEXERIL) 10 MG tablet Take 10 mg by mouth 2 (two) times daily as needed for muscle spasms.     Gelatin 600 MG CAPS Take 3 capsules by mouth daily. (Patient not taking: Reported on 01/02/2024)     HYDROcodone-acetaminophen (NORCO/VICODIN) 5-325 MG tablet Take 1 tablet by mouth every 4 (four) hours as needed. (Patient not taking: Reported on 01/02/2024)     potassium chloride (KLOR-CON) 10 MEQ tablet Take 1 tablet (10 mEq total) by mouth daily. 90 tablet 3   predniSONE (DELTASONE) 10 MG tablet Take 6 tabs on day 1, 5 tabs on day 2, 4 tabs on day 3, 3 tabs on day 4, 2 tabs on day 5, 1 tab on day 6 (Patient not taking: Reported on 01/02/2024) 21 tablet 0   rosuvastatin (CRESTOR) 40 MG tablet Take 1 tablet (40 mg total) by mouth daily. 30 tablet 0  No current facility-administered medications for this visit.    No Known Allergies  Family History  Problem Relation Age of Onset   Heart disease Mother    Heart attack Father    Heart disease Father    Stroke Maternal Grandmother    Diabetes Maternal Grandmother     Social History   Socioeconomic History   Marital status: Single    Spouse name: Not on file   Number of children: Not on file   Years of education: Not on file   Highest education level: Associate degree: occupational, Scientist, product/process development, or vocational program  Occupational History   Not on file  Tobacco Use   Smoking status:  Every Day    Current packs/day: 0.50    Average packs/day: 0.3 packs/day for 31.8 years (8.4 ttl pk-yrs)    Types: Cigarettes    Start date: 03/13/1992    Last attempt to quit: 03/13/2022   Smokeless tobacco: Never   Tobacco comments:    10 cigarettes daily. Khj 10/14/2023   Vaping Use   Vaping status: Never Used  Substance and Sexual Activity   Alcohol use: Yes    Alcohol/week: 0.0 standard drinks of alcohol    Comment: rare   Drug use: No   Sexual activity: Not Currently  Other Topics Concern   Not on file  Social History Narrative   Not on file   Social Drivers of Health   Financial Resource Strain: Low Risk  (01/02/2024)   Overall Financial Resource Strain (CARDIA)    Difficulty of Paying Living Expenses: Not hard at all  Food Insecurity: No Food Insecurity (01/02/2024)   Hunger Vital Sign    Worried About Running Out of Food in the Last Year: Never true    Ran Out of Food in the Last Year: Never true  Recent Concern: Food Insecurity - Food Insecurity Present (12/05/2023)   Hunger Vital Sign    Worried About Running Out of Food in the Last Year: Sometimes true    Ran Out of Food in the Last Year: Never true  Transportation Needs: No Transportation Needs (01/02/2024)   PRAPARE - Administrator, Civil Service (Medical): No    Lack of Transportation (Non-Medical): No  Physical Activity: Insufficiently Active (01/02/2024)   Exercise Vital Sign    Days of Exercise per Week: 2 days    Minutes of Exercise per Session: 20 min  Stress: No Stress Concern Present (01/02/2024)   [employer] of Occupational Health - Occupational Stress Questionnaire    Feeling of Stress : Only a little  Social Connections: Socially Isolated (01/02/2024)   Social Connection and Isolation Panel [NHANES]    Frequency of Communication with Friends and Family: More than three times a week    Frequency of Social Gatherings with Friends and Family: More than three times a week    Attends Religious  Services: Never    Database administrator or Organizations: No    Attends Banker Meetings: Never    Marital Status: Divorced  Catering manager Violence: Not At Risk (01/02/2024)   Humiliation, Afraid, Rape, and Kick questionnaire    Fear of Current or Ex-Partner: No    Emotionally Abused: No    Physically Abused: No    Sexually Abused: No    Subjective:   Review of Systems:   Constitutional: Denies fever, malaise, fatigue, headache or abrupt weight changes.  HEENT: Denies eye pain, eye redness, ear pain, ringing in the ears, wax  buildup, runny nose, nasal congestion, bloody nose, or sore throat. Respiratory: Pt reports chronic cough, shortness of breath. Denies difficulty breathing, or sputum production.   Cardiovascular: Denies chest pain, chest tightness, palpitations or swelling in the hands or feet.  Gastrointestinal: Denies abdominal pain, bloating, constipation, diarrhea or blood in the stool.  GU: Denies urgency, frequency, pain with urination, burning sensation, blood in urine, odor or discharge. Musculoskeletal: Pt reports chronic back pain. Denies decrease in range of motion, difficulty with gait, or joint swelling.  Skin: Denies redness, rashes, lesions or ulcercations.  Neurological: Pt reports insomnia. Denies dizziness, difficulty with memory, difficulty with speech or problems with balance and coordination.  Psych: Pt reports anxiety and depression. Denies SI/HI.  No other specific complaints in a complete review of systems (except as listed in HPI above).  Objective:  PE:   BP 110/60 (BP Location: Left Arm, Patient Position: Sitting, Cuff Size: Normal)   Ht 5\' 4"  (1.626 m)   Wt 94 lb (42.6 kg)   BMI 16.14 kg/m    Wt Readings from Last 3 Encounters:  12/10/23 95 lb 9.6 oz (43.4 kg)  12/03/23 92 lb 12.8 oz (42.1 kg)  11/04/23 98 lb (44.5 kg)    General: Appears her stated age, chronically ill appearing, underweight, in NAD. Cardiovascular:  Tachycardic with normal rhythm.  Pulmonary/Chest: Normal effort and coarse breath sounds with bilateral expiratory wheezing noted. No respiratory distress. No rales or ronchi noted.  Neurological: Alert and oriented.  Psychiatric: Mood is flat. Mildly anxious appearing. Judgment and thought content normal.    BMET    Component Value Date/Time   NA 141 07/16/2023 0000   K 4.4 07/16/2023 0000   CL 103 07/16/2023 0000   CO2 24 07/16/2023 0000   GLUCOSE 95 07/16/2023 0000   GLUCOSE 107 (H) 02/12/2023 0859   BUN 10 07/16/2023 0000   CREATININE 0.85 07/16/2023 0000   CALCIUM 9.3 07/16/2023 0000   GFRNONAA >60 02/12/2023 0859   GFRAA 93 07/20/2020 1541    Lipid Panel     Component Value Date/Time   CHOL 173 07/16/2023 0000   TRIG 65 07/16/2023 0000   HDL 67 07/16/2023 0000   CHOLHDL 2.6 07/16/2023 0000   LDLCALC 93 07/16/2023 0000    CBC    Component Value Date/Time   WBC 8.7 07/16/2023 0000   WBC 12.3 (H) 02/05/2023 1050   RBC 5.22 07/16/2023 0000   RBC 5.35 (H) 02/05/2023 1050   HGB 16.2 (H) 07/16/2023 0000   HCT 48.8 (H) 07/16/2023 0000   PLT 279 07/16/2023 0000   MCV 94 07/16/2023 0000   MCH 31.0 07/16/2023 0000   MCH 30.5 02/05/2023 1050   MCHC 33.2 07/16/2023 0000   MCHC 32.5 02/05/2023 1050   RDW 12.5 07/16/2023 0000   LYMPHSABS 4.0 05/15/2012 1525   MONOABS 0.8 05/15/2012 1525   EOSABS 0.3 05/15/2012 1525   BASOSABS 0.1 05/15/2012 1525    Hgb A1C Lab Results  Component Value Date   HGBA1C 6.0 (A) 12/10/2023      Assessment and Plan:   Assessment and Plan    Anxiety and Depression Experiencing depression and anxiety due to family stressors. Previously effective treatment with venlafaxine. Acknowledged medication's role in symptom relief but not as a sole solution. - Prescribe venlafaxine 37.5 mg once daily. - Advise to start medication tonight. - Discuss potential need for dose adjustment. - Encourage communication via MyChart for concerns or  dose adjustments before next appointment.  Underweight Underweight  with nutritional concerns impacting health and illness resilience. Regular eating reported but requires supplementation. - Recommend nutritional supplementation with Boost or similar products to aid in weight gain.        RTC in 4 months for follow-up of chronic Nicki Reaper, NP

## 2024-01-19 NOTE — Telephone Encounter (Signed)
 Gastroenterology Pre-Procedure Review  Request Date: 03/04/24 Requesting Physician: Dr. Allegra Lai  PATIENT REVIEW QUESTIONS: The patient responded to the following health history questions as indicated:    1. Are you having any GI issues?  Positive cologuard, no previous colonoscopy this is her 1st. 2. Do you have a personal history of Polyps? no 3. Do you have a family history of Colon Cancer or Polyps? no 4. Diabetes Mellitus? no 5. Joint replacements in the past 12 months?no 6. Major health problems in the past 3 months?no 7. Any artificial heart valves, MVP, or defibrillator?no    MEDICATIONS & ALLERGIES:    Patient reports the following regarding taking any anticoagulation/antiplatelet therapy:   Plavix, Coumadin, Eliquis, Xarelto, Lovenox, Pradaxa, Brilinta, or Effient? no Aspirin? Yes 81 mg daily  Patient confirms/reports the following medications:  Current Outpatient Medications  Medication Sig Dispense Refill   albuterol (VENTOLIN HFA) 108 (90 Base) MCG/ACT inhaler Inhale 1-2 puffs into the lungs every 6 (six) hours as needed for wheezing or shortness of breath. 18 g 2   aspirin EC 81 MG tablet Take 1 tablet (81 mg total) by mouth daily. Swallow whole. 90 tablet 3   Budeson-Glycopyrrol-Formoterol (BREZTRI AEROSPHERE) 160-9-4.8 MCG/ACT AERO Inhale 2 puffs into the lungs in the morning and at bedtime.     cilostazol (PLETAL) 100 MG tablet TAKE 1 TABLET BY MOUTH TWICE DAILY BEFORE A MEAL 60 tablet 0   cyclobenzaprine (FLEXERIL) 10 MG tablet Take 10 mg by mouth 2 (two) times daily as needed for muscle spasms.     Gelatin 600 MG CAPS Take 3 capsules by mouth daily. (Patient not taking: Reported on 01/02/2024)     HYDROcodone-acetaminophen (NORCO/VICODIN) 5-325 MG tablet Take 1 tablet by mouth every 4 (four) hours as needed. (Patient not taking: Reported on 01/02/2024)     potassium chloride (KLOR-CON) 10 MEQ tablet Take 1 tablet (10 mEq total) by mouth daily. 90 tablet 3   predniSONE  (DELTASONE) 10 MG tablet Take 6 tabs on day 1, 5 tabs on day 2, 4 tabs on day 3, 3 tabs on day 4, 2 tabs on day 5, 1 tab on day 6 (Patient not taking: Reported on 01/02/2024) 21 tablet 0   rosuvastatin (CRESTOR) 40 MG tablet Take 1 tablet (40 mg total) by mouth daily. 30 tablet 0   No current facility-administered medications for this visit.    Patient confirms/reports the following allergies:  No Known Allergies  No orders of the defined types were placed in this encounter.   AUTHORIZATION INFORMATION Primary Insurance: 1D#: Group #:  Secondary Insurance: 1D#: Group #:  SCHEDULE INFORMATION: Date: 03/04/24 Time: Location: [facility]

## 2024-01-19 NOTE — Patient Instructions (Signed)
 Major Depressive Disorder, Adult Major depressive disorder (MDD) is a mental health condition. People with this disorder feel very sad, hopeless, and lose interest in things. Symptoms last most of the day, almost every day, for 2 weeks. MDD can affect: Relationships. Work and school. Things you usually like to do. What are the causes? The cause of MDD is not known. What increases the risk? Having family members with depression. Being female. Family problems. Alcohol or drug misuse. A lot of stress in your life, such as from: Living without basic needs such as food and housing. Being treated poorly because of race, sex, or religion (discrimination). Things that caused you pain as a child, especially if you lost a parent or were abused. Health and mental problems that you have had for a long time. What are the signs or symptoms? The main symptoms of this condition are: Being sad all the time. Being grouchy (irritable) all the time. Not enjoying the things you usually like. Sleeping too much or too little. Eating too much or too little. Feeling tired. Other symptoms include: Gaining or losing weight, without knowing why. Being restless and weak. Feeling hopeless, worthless, or guilty. Trouble thinking or making decisions. Thoughts of hurting yourself or others, or thoughts of ending your life. Spending a lot of time alone. Being unable to do daily tasks. If you have very bad MDD, you may: Believe things that are not true. Hear, see, taste, or feel things that are not there. Have mild depression that lasts for at least 2 years. Feel very sad and hopeless. Have trouble speaking or moving. Feel very sad during some seasons. How is this treated? Talk therapy. This teaches you about thoughts, feelings, and actions and how to change them. This can also help you to talk with others. This can be done with members of your family. Medicines. Lifestyle changes. You may need to: Limit  alcohol use. Stop using drugs, if you use them. Exercise. Get plenty of sleep. Eat healthy. Spend more time outdoors. Brain stimulation. This may be done when symptoms are very bad or have not gotten better. Follow these instructions at home: Alcohol use Do not drink alcohol if: Your health care provider tells you not to drink. You are pregnant, may be pregnant, or are planning to become pregnant. If you drink alcohol: Limit how much you use to: 0-1 drink a day for women. 0-2 drinks a day for men. Know how much alcohol is in your drink. In the [place] one drink equals one 12 oz bottle of beer (355 mL), one 5 oz glass of wine (148 mL), or one 1 oz glass of hard liquor (44 mL). Activity Exercise as told by your doctor. Spend time outdoors. Make time to do the things you enjoy. Find ways to deal with stress. Try to: Meditate. Do deep breathing. Spend time in nature. Keep a journal. Return to your normal activities when your doctor says that it is safe. General instructions  Take over-the-counter and prescription medicines only as told by your doctor. Talk to your doctor about: Alcohol use. It can affect medicines. Any drug use. Eat healthy foods. Get a lot of sleep. Think about joining a support group. Ask your doctor about that. Keep all follow-up visits. Your doctor will need to check on your mood, behavior, and medicines, and change your treatment as needed. Where to find more information: [employer] on Mental Illness: nami[employer] of Mental Health: BloggerCourse.com American Psychiatric Association: psychiatry.org Contact a doctor  if: You feel worse. You get new symptoms. Get help right away if: You hurt yourself on purpose (self-harm). You have thoughts about hurting yourself or others. You see, hear, taste, smell, or feel things that are not there. Get help right away if you feel like you may hurt yourself or others, or have thoughts about taking  your own life. Go to your nearest emergency room or: Call 911. Call the National Suicide Prevention Lifeline at (513) 004-4926 or 988. This is open 24 hours a day. Text the Crisis Text Line at (314) 815-6587. This information is not intended to replace advice given to you by your health care provider. Make sure you discuss any questions you have with your health care provider. Document Revised: 02/19/2022 Document Reviewed: 02/19/2022 Elsevier Patient Education  2024 [employer].

## 2024-01-19 NOTE — Assessment & Plan Note (Signed)
 Encourage high-protein, high-calorie diet Encourage supplements such as boost

## 2024-01-28 ENCOUNTER — Ambulatory Visit: Attending: Cardiology | Admitting: Cardiology

## 2024-01-28 VITALS — BP 106/68 | HR 111 | Ht 63.0 in | Wt 92.0 lb

## 2024-01-28 DIAGNOSIS — Z01810 Encounter for preprocedural cardiovascular examination: Secondary | ICD-10-CM

## 2024-01-28 DIAGNOSIS — R000 Tachycardia, unspecified: Secondary | ICD-10-CM

## 2024-01-28 DIAGNOSIS — Z79899 Other long term (current) drug therapy: Secondary | ICD-10-CM | POA: Diagnosis present

## 2024-01-28 DIAGNOSIS — I739 Peripheral vascular disease, unspecified: Secondary | ICD-10-CM | POA: Diagnosis not present

## 2024-01-28 DIAGNOSIS — E782 Mixed hyperlipidemia: Secondary | ICD-10-CM | POA: Diagnosis present

## 2024-01-28 MED ORDER — ROSUVASTATIN CALCIUM 40 MG PO TABS: 40.0000 mg | ORAL_TABLET | Freq: Every day | ORAL | 3 refills | Status: AC

## 2024-01-28 NOTE — Progress Notes (Unsigned)
 Cardiology Office Note:  .   Date:  01/30/2024  ID:  Pamela Haley, DOB 12-29-1950, MRN [id number] PCP: Lorre Munroe, NP  [facility] Providers Cardiologist:  Lorine Bears, MD    History of Present Illness: .   Pamela Haley is a 73 y.o. female with a past medical history of peripheral arterial disease, extensive tobacco use, COPD, hyperlipidemia, chronic back pain, who is here today to follow-up on her peripheral arterial disease.   Prior echocardiogram recently on January 24 showed normal LV systolic function with no significant valvular abnormalities.  Lower extremity arterial Doppler March 24 showed severely reduced ABI bilaterally with evidence of severe bilateral common iliac artery disease with systolic velocities greater than 500 with no significant infrainguinal disease.  She underwent lumbar spine surgery in January 2024 with improvement in her chronic lower back pain however she continued to send significant bilateral hip discomfort with exertion.  Angiogram showed occluded distal aorta into the iliac arteries.  She was referred to Dr. Sherral Hammers for evaluation of arterial bifemoral bypass and was prescribed cilostazol with reports of improvement in symptoms.  She ended up having a Lexiscan Myoview in June which showed no evidence of ischemia with a normal ejection fraction.  She was last seen in clinic 07/08/2023 by Dr.Arida.  At that time she denied chest pain or worsening dyspnea.  She was tachycardic but denied palpitations.  She does have history of hyperthyroidism.  At that time she needed to have a sacroiliac fusion surgery completed.  There were no changes made to her medications.  She was sent for routine labs.  She was cleared for surgery from the cardiovascular standpoint as she had recently had testing that was completed and both were unremarkable.  She returns to clinic today with continued concerns over weight loss.  She states that she needs cardiovascular  clearance for an upcoming colonoscopy.  States that she has had no palpitations, chest pain or worsening shortness of breath.  She stated that she had previously been sick with either the flu or COVID and had had a cough that caused some shortness of breath at that time that had slowly resolved.  Stated that she continued to take her cilostazol with no further complaints of claudication or rest pain.  States that she has been compliant with her current medication regimen.  Denies any recent hospitalizations or visits to the emergency department.  ROS: 10 point review of systems has been reviewed and considered negative the exception was been listed in the HPI  Studies Reviewed: Marland Kitchen       Lexiscan Myoview 04/01/2023   Normal pharmacologic myocardial perfusion stress test without evidence of significant ischemia or scar.   Left ventricular systolic function is normal (LVEF > 65%).   Coronary artery calcification and aortic atherosclerosis are noted on the attenuation correction CT.   This is a low risk study.  2D echo 11/13/2022 1. Left ventricular ejection fraction, by estimation, is 60 to 65%. The  left ventricle has normal function. The left ventricle has no regional  wall motion abnormalities. Left ventricular diastolic parameters are  consistent with Grade II diastolic  dysfunction (pseudonormalization).   2. Right ventricular systolic function is normal. The right ventricular  size is normal. There is normal pulmonary artery systolic pressure.   3. The mitral valve is normal in structure. No evidence of mitral valve  regurgitation.   4. The aortic valve was not well visualized. Aortic valve regurgitation  is not visualized.  Aortic valve sclerosis is present, with no evidence of  aortic valve stenosis.   5. The inferior vena cava is normal in size with greater than 50%  respiratory variability, suggesting right atrial pressure of 3 mmHg.  Risk Assessment/Calculations:     Physical Exam:    VS:  BP 106/68   Pulse (!) 111   Ht 5\' 3"  (1.6 m)   Wt 92 lb (41.7 kg)   SpO2 91%   BMI 16.30 kg/m    Wt Readings from Last 3 Encounters:  01/28/24 92 lb (41.7 kg)  01/19/24 94 lb (42.6 kg)  12/10/23 95 lb 9.6 oz (43.4 kg)    GEN: Well nourished, well developed in no acute distress NECK: No JVD; No carotid bruits CARDIAC: RRR, no murmurs, rubs, gallops RESPIRATORY:  Clear to auscultation without rales, wheezing or rhonchi  ABDOMEN: Soft, non-tender, non-distended EXTREMITIES:  No edema; No deformity   ASSESSMENT AND PLAN: .   Hyperlipidemia with last LDL of 93.  She is continued on rosuvastatin 40 mg daily which she requires refill of today.  She is also being sent for an updated lipid and hepatic panel.  Peripheral arterial disease with bilateral lower extremity claudication due to occluded distal aorta.  Symptoms improved with cilostazol.  She continues to be followed by vascular but currently there are no plans for arterial bifemoral bypass.  Sinus tachycardia with heart rate continues to remain elevated today looking back through chart review she has been tachycardic since around 2019.  Discussed further workup patient opted to wait until return after she has had upcoming GI appointment completed.  Preoperative cardiovascular examination with patient recently had a positive Cologuard test and has an upcoming colonoscopy.  She also has noted weight loss.    Pamela Haley perioperative risk of a major cardiac event is 0.4% according to the Revised Cardiac Risk Index (RCRI).  Therefore, she is at low risk for perioperative complications.   Her functional capacity is fair at 5.07 METs according to the Duke Activity Status Index (DASI). Recommendations: According to ACC/AHA guidelines, no further cardiovascular testing needed.  The patient may proceed to surgery at acceptable risk.          Dispo: Patient return to clinic to see MD/APP in 6 months or sooner if  needed  Signed, Orris Perin, NP

## 2024-01-28 NOTE — Patient Instructions (Signed)
{  Medication Instructions:  Your physician recommends that you continue on your current medications as directed. Please refer to the Current Medication list given to you today.   *If you need a refill on your cardiac medications before your next appointment, please call your pharmacy*  Lab Work: Your provider would like for you to return in 1 week to have the following labs drawn: Lipid, LFT.   Please go to [facility] [address] ([facility] [address], [place] [address] You do not need an appointment.  They are open from 8 am- 4:30 pm.  Lunch from 1:00 pm- 2:00 pm You will need to be fasting.   Testing/Procedures: No test ordered today   Follow-Up: At [facility], you and your health needs are our priority.  As part of our continuing mission to provide you with exceptional heart care, our providers are all part of one team.  This team includes your primary Cardiologist (physician) and Advanced Practice Providers or APPs (Physician Assistants and Nurse Practitioners) who all work together to provide you with the care you need, when you need it.  Your next appointment:   6 month(s)  Provider:   You may see Lorine Bears, MD or one of the following Advanced Practice Providers on your designated Care Team:   Nicolasa Ducking, NP Ames Dura, PA-C Eula Listen, PA-C Cadence Dillon, PA-C Charlsie Quest, NP Carlos Levering, NP    We recommend signing up for the patient portal called "MyChart".  Sign up information is provided on this After Visit Summary.  MyChart is used to connect with patients for Virtual Visits (Telemedicine).  Patients are able to view lab/test results, encounter notes, upcoming appointments, etc.  Non-urgent messages can be sent to your provider as well.   To learn more about what you can do with MyChart, go to [email or web address].

## 2024-01-30 ENCOUNTER — Encounter: Payer: Self-pay | Admitting: Cardiology

## 2024-02-02 ENCOUNTER — Other Ambulatory Visit: Payer: Self-pay

## 2024-02-02 MED ORDER — BREZTRI AEROSPHERE 160-9-4.8 MCG/ACT IN AERO: 2.0000 | INHALATION_SPRAY | Freq: Two times a day (BID) | RESPIRATORY_TRACT | 11 refills | Status: DC

## 2024-02-05 ENCOUNTER — Telehealth: Payer: Self-pay | Admitting: Pulmonary Disease

## 2024-02-05 ENCOUNTER — Other Ambulatory Visit: Payer: Self-pay

## 2024-02-05 NOTE — Telephone Encounter (Signed)
 Calling to schedule her PFT//mm

## 2024-02-12 ENCOUNTER — Ambulatory Visit: Payer: Medicare Other | Admitting: Pulmonary Disease

## 2024-02-25 ENCOUNTER — Telehealth: Payer: Self-pay

## 2024-02-25 NOTE — Telephone Encounter (Signed)
 The patient called and left a voicemail requesting to speak with someone regarding the preparation for her procedure scheduled for next Thursday.

## 2024-02-27 NOTE — Telephone Encounter (Signed)
 Okay, thank you!

## 2024-03-04 ENCOUNTER — Ambulatory Visit: Admitting: Anesthesiology

## 2024-03-04 ENCOUNTER — Ambulatory Visit
Admission: RE | Admit: 2024-03-04 | Discharge: 2024-03-04 | Disposition: A | Discharge status: Home / Self Care | Attending: Gastroenterology | Admitting: Gastroenterology

## 2024-03-04 ENCOUNTER — Encounter
Admission: RE | Disposition: A | Discharge status: Home / Self Care | Payer: Self-pay | Source: Home / Self Care | Attending: Gastroenterology

## 2024-03-04 ENCOUNTER — Encounter: Payer: Self-pay | Admitting: Gastroenterology

## 2024-03-04 DIAGNOSIS — I739 Peripheral vascular disease, unspecified: Secondary | ICD-10-CM | POA: Diagnosis not present

## 2024-03-04 DIAGNOSIS — F17210 Nicotine dependence, cigarettes, uncomplicated: Secondary | ICD-10-CM | POA: Diagnosis not present

## 2024-03-04 DIAGNOSIS — R195 Other fecal abnormalities: Secondary | ICD-10-CM | POA: Diagnosis present

## 2024-03-04 DIAGNOSIS — D124 Benign neoplasm of descending colon: Secondary | ICD-10-CM | POA: Diagnosis not present

## 2024-03-04 DIAGNOSIS — D128 Benign neoplasm of rectum: Secondary | ICD-10-CM | POA: Diagnosis not present

## 2024-03-04 DIAGNOSIS — E0591 Thyrotoxicosis, unspecified with thyrotoxic crisis or storm: Secondary | ICD-10-CM | POA: Insufficient documentation

## 2024-03-04 DIAGNOSIS — K635 Polyp of colon: Secondary | ICD-10-CM | POA: Diagnosis not present

## 2024-03-04 DIAGNOSIS — Z1211 Encounter for screening for malignant neoplasm of colon: Secondary | ICD-10-CM | POA: Diagnosis not present

## 2024-03-04 DIAGNOSIS — F32A Depression, unspecified: Secondary | ICD-10-CM | POA: Insufficient documentation

## 2024-03-04 DIAGNOSIS — J449 Chronic obstructive pulmonary disease, unspecified: Secondary | ICD-10-CM | POA: Diagnosis not present

## 2024-03-04 DIAGNOSIS — D122 Benign neoplasm of ascending colon: Secondary | ICD-10-CM | POA: Diagnosis not present

## 2024-03-04 DIAGNOSIS — D123 Benign neoplasm of transverse colon: Secondary | ICD-10-CM | POA: Insufficient documentation

## 2024-03-04 DIAGNOSIS — F419 Anxiety disorder, unspecified: Secondary | ICD-10-CM | POA: Diagnosis not present

## 2024-03-04 HISTORY — PX: COLONOSCOPY: SHX5424

## 2024-03-04 HISTORY — PX: POLYPECTOMY: SHX149

## 2024-03-04 SURGERY — COLONOSCOPY
Anesthesia: General

## 2024-03-04 MED ORDER — DEXMEDETOMIDINE HCL IN NACL 80 MCG/20ML IV SOLN
INTRAVENOUS | Status: DC | PRN
  Administered 2024-03-04 (×2): 8 ug via INTRAVENOUS
  Administered 2024-03-04: 4 ug via INTRAVENOUS

## 2024-03-04 MED ORDER — SODIUM CHLORIDE 0.9 % IV SOLN
INTRAVENOUS | Status: DC
Start: 2024-03-04 — End: 2024-03-04

## 2024-03-04 MED ORDER — EPHEDRINE 5 MG/ML INJ
INTRAVENOUS | Status: AC
  Filled 2024-03-04: qty 10

## 2024-03-04 MED ORDER — PROPOFOL 10 MG/ML IV BOLUS
INTRAVENOUS | Status: DC | PRN
  Administered 2024-03-04 (×2): 20 mg via INTRAVENOUS

## 2024-03-04 MED ORDER — PROPOFOL 500 MG/50ML IV EMUL
INTRAVENOUS | Status: DC | PRN
  Administered 2024-03-04: 75 ug/kg/min via INTRAVENOUS

## 2024-03-04 MED ORDER — LIDOCAINE HCL (CARDIAC) PF 100 MG/5ML IV SOSY
PREFILLED_SYRINGE | INTRAVENOUS | Status: DC | PRN
  Administered 2024-03-04: 40 mg via INTRAVENOUS

## 2024-03-04 MED ORDER — EPHEDRINE SULFATE-NACL 50-0.9 MG/10ML-% IV SOSY
PREFILLED_SYRINGE | INTRAVENOUS | Status: DC | PRN
  Administered 2024-03-04 (×2): 10 mg via INTRAVENOUS
  Administered 2024-03-04: 5 mg via INTRAVENOUS
  Administered 2024-03-04 (×2): 10 mg via INTRAVENOUS

## 2024-03-04 MED ORDER — DEXMEDETOMIDINE HCL IN NACL 80 MCG/20ML IV SOLN
INTRAVENOUS | Status: AC
  Filled 2024-03-04: qty 20

## 2024-03-04 MED ORDER — LIDOCAINE HCL (PF) 2 % IJ SOLN
INTRAMUSCULAR | Status: AC
  Filled 2024-03-04: qty 10

## 2024-03-04 MED ORDER — EPHEDRINE 5 MG/ML INJ
INTRAVENOUS | Status: AC
  Filled 2024-03-04: qty 5

## 2024-03-04 NOTE — Op Note (Signed)
 [facility] Patient Name: Pamela Haley Procedure Date: 03/04/2024 1:45 PM MRN: [id number] Account #: [id number] Date of Birth: 1951/09/30 Admit Type: Outpatient Age: 73 Room: [facility] ENDO ROOM 3 Gender: Female Note Status: Finalized Instrument Name: Peds Colonoscope [id number] Procedure:             Colonoscopy Indications:           This is the patient's first colonoscopy, Positive                         Cologuard test Providers:             Selena Daily MD, MD Referring MD:          Selena Daily MD, MD (Referring MD), Carollynn Cirri (Referring MD) Medicines:             General Anesthesia Complications:         No immediate complications. Estimated blood loss:                         Minimal. Procedure:             Pre-Anesthesia Assessment:                        - Prior to the procedure, a History and Physical was                         performed, and patient medications and allergies were                         reviewed. The patient is competent. The risks and                         benefits of the procedure and the sedation options and                         risks were discussed with the patient. All questions                         were answered and informed consent was obtained.                         Patient identification and proposed procedure were                         verified by the physician, the nurse, the                         anesthesiologist, the anesthetist and the technician                         in the pre-procedure area in the procedure room in the                         endoscopy suite. Mental Status Examination: alert and  oriented. Airway Examination: normal oropharyngeal                         airway and neck mobility. Respiratory Examination:                         clear to auscultation. CV Examination: normal.                         Prophylactic  Antibiotics: The patient does not require                         prophylactic antibiotics. Prior Anticoagulants: The                         patient has taken no anticoagulant or antiplatelet                         agents. ASA Grade Assessment: III - A patient with                         severe systemic disease. After reviewing the risks and                         benefits, the patient was deemed in satisfactory                         condition to undergo the procedure. The anesthesia                         plan was to use general anesthesia. Immediately prior                         to administration of medications, the patient was                         re-assessed for adequacy to receive sedatives. The                         heart rate, respiratory rate, oxygen  saturations,                         blood pressure, adequacy of pulmonary ventilation, and                         response to care were monitored throughout the                         procedure. The physical status of the patient was                         re-assessed after the procedure.                        After obtaining informed consent, the colonoscope was                         passed under direct vision. Throughout the procedure,  the patient's blood pressure, pulse, and oxygen                          saturations were monitored continuously. The                         Colonoscope was introduced through the anus and                         advanced to the the cecum, identified by appendiceal                         orifice and ileocecal valve. The colonoscopy was                         performed without difficulty. The patient tolerated                         the procedure well. The quality of the bowel                         preparation was evaluated using the BBPS [facility] Bowel                         Preparation Scale) with scores of: Right Colon = 3,                          Transverse Colon = 3 and Left Colon = 3 (entire mucosa                         seen well with no residual staining, small fragments                         of stool or opaque liquid). The total BBPS score                         equals 9. The ileocecal valve, appendiceal orifice,                         and rectum were photographed. Findings:      The perianal and digital rectal examinations were normal. Pertinent       negatives include normal sphincter tone and no palpable rectal lesions.      Two sessile polyps were found in the ascending colon. The polyps were 4       to 5 mm in size. These polyps were removed with a cold snare. Resection       and retrieval were complete.      An 8 mm polyp was found in the transverse colon. The polyp was sessile.       The polyp was removed with a hot snare. Resection and retrieval were       complete.      Five sessile polyps were found in the transverse colon. The polyps were       4 to 5 mm in size. These polyps were removed with a cold snare.       Resection and retrieval were complete.      Three sessile polyps were found in the  descending colon. The polyps were       5 to 6 mm in size. These polyps were removed with a hot snare. Resection       and retrieval were complete.      A 4 mm polyp was found in the descending colon. The polyp was sessile.       The polyp was removed with a cold snare. Resection and retrieval were       complete. Estimated blood loss: none.      Six sessile polyps were found in the rectum and sigmoid colon. The       polyps were 4 to 5 mm in size. These polyps were removed with a cold       snare. Resection and retrieval were complete.      The retroflexed view of the distal rectum and anal verge was normal and       showed no anal or rectal abnormalities. Impression:            - Two 4 to 5 mm polyps in the ascending colon, removed                         with a cold snare. Resected and retrieved.                         - One 8 mm polyp in the transverse colon, removed with                         a hot snare. Resected and retrieved.                        - Five 4 to 5 mm polyps in the transverse colon,                         removed with a cold snare. Resected and retrieved.                        - Three 5 to 6 mm polyps in the descending colon,                         removed with a hot snare. Resected and retrieved.                        - One 4 mm polyp in the descending colon, removed with                         a cold snare. Resected and retrieved.                        - Six 4 to 5 mm polyps in the rectum and in the                         sigmoid colon, removed with a cold snare. Resected and                         retrieved.                        - The distal  rectum and anal verge are normal on                         retroflexion view. Recommendation:        - Discharge patient to home (with escort).                        - Resume previous diet today.                        - Continue present medications.                        - Await pathology results.                        - Repeat colonoscopy in 1 year for surveillance of                         multiple polyps. Procedure Code(s):     --- Professional ---                        [phone], Colonoscopy, flexible; with removal of                         tumor(s), polyp(s), or other lesion(s) by snare                         technique Diagnosis Code(s):     --- Professional ---                        D12.2, Benign neoplasm of ascending colon                        D12.3, Benign neoplasm of transverse colon (hepatic                         flexure or splenic flexure)                        D12.4, Benign neoplasm of descending colon                        D12.8, Benign neoplasm of rectum                        D12.5, Benign neoplasm of sigmoid colon                        R19.5, Other fecal abnormalities CPT copyright 2022 American  Medical Association. All rights reserved. The codes documented in this report are preliminary and upon coder review may  be revised to meet current compliance requirements. Dr. Evia Hof Selena Daily MD, MD 03/04/2024 2:47:41 PM This report has been signed electronically. Number of Addenda: 0 Note Initiated On: 03/04/2024 1:45 PM Scope Withdrawal Time: 0 hours 27 minutes 54 seconds  Total Procedure Duration: 0 hours 31 minutes 56 seconds  Estimated Blood Loss:  Estimated blood loss was minimal.      [facility]

## 2024-03-04 NOTE — Anesthesia Preprocedure Evaluation (Signed)
 Anesthesia Evaluation  Patient identified by MRN, date of birth, ID band Patient awake    Reviewed: Allergy & Precautions, H&P , NPO status , Patient's Chart, lab work & pertinent test results, reviewed documented beta blocker date and time   Airway Mallampati: II   Neck ROM: full    Dental  (+) Poor Dentition   Pulmonary COPD, Current Smoker   Pulmonary exam normal        Cardiovascular Exercise Tolerance: Poor + Peripheral Vascular Disease  Normal cardiovascular exam Rhythm:regular Rate:Normal     Neuro/Psych  PSYCHIATRIC DISORDERS Anxiety Depression    negative neurological ROS     GI/Hepatic negative GI ROS, Neg liver ROS,,,  Endo/Other   Hyperthyroidism   Renal/GU negative Renal ROS  negative genitourinary   Musculoskeletal   Abdominal   Peds  Hematology negative hematology ROS (+)   Anesthesia Other Findings Past Medical History: No date: Abnormal EKG     Comment:  HX OF PREVIOUS EKGS SHOWING POSSIBLE MI--BUT PT HAS               NEVER HAD ANY HEART PROBLEMS--HAD BACK SURGERY AUG 2012               AT [facility] - NO HEART PROBLEMS No date: Atherosclerosis of native artery of both lower extremities  with intermittent claudication (HCC) No date: Chronic cough     Comment:  PT IS A SMOKER No date: COPD (chronic obstructive pulmonary disease) (HCC) No date: DDD (degenerative disc disease), lumbar     Comment:  PAIN IN LOWER BACK AND DOWN RT HIP AND LEG--NUMBNESS,               THROBBING, BURNING No date: Grade II diastolic dysfunction 02/20/2015: Hyperthyroidism No date: Occluded aorta (HCC) No date: PAD (peripheral artery disease) (HCC) No date: PAD (peripheral artery disease) (HCC) Past Surgical History: 02/12/2023: ABDOMINAL AORTOGRAM W/LOWER EXTREMITY; N/A     Comment:  Procedure: ABDOMINAL AORTOGRAM W/LOWER EXTREMITY;                Surgeon: Wenona Hamilton, MD;  [facility]: [facility] INVASIVE CV               LAB;  Service: Cardiovascular;  Laterality: N/A; 05/2011: BACK SURGERY     Comment:  lumbar surgery at [facility] No date: BILATERAL CARPAL TUNNEL RELEASE No date: C SECTIONS X 3 10/20/2023: CATARACT EXTRACTION W/PHACO; Left     Comment:  Procedure: CATARACT EXTRACTION PHACO AND INTRAOCULAR               LENS PLACEMENT (IOC) LEFT 8.35 00:44.3;  Surgeon:               Clair Crews, MD;  Location: [facility];                Service: Ophthalmology;  Laterality: Left; 11/04/2023: CATARACT EXTRACTION W/PHACO; Right     Comment:  Procedure: CATARACT EXTRACTION PHACO AND INTRAOCULAR               LENS PLACEMENT (IOC) RIGHT 7.52 00:50.5;  Surgeon:               Clair Crews, MD;  Location: [facility];                Service: Ophthalmology;  Laterality: Right; BMI    Body Mass Index: 15.31 kg/m     Reproductive/Obstetrics negative OB ROS  Anesthesia Physical Anesthesia Plan  ASA: 3  Anesthesia Plan: General   Post-op Pain Management:    Induction:   PONV Risk Score and Plan:   Airway Management Planned:   Additional Equipment:   Intra-op Plan:   Post-operative Plan:   Informed Consent: I have reviewed the patients History and Physical, chart, labs and discussed the procedure including the risks, benefits and alternatives for the proposed anesthesia with the patient or authorized representative who has indicated his/her understanding and acceptance.     Dental Advisory Given  Plan Discussed with: CRNA  Anesthesia Plan Comments:        Anesthesia Quick Evaluation

## 2024-03-04 NOTE — Transfer of Care (Signed)
 Immediate Anesthesia Transfer of Care Note  Patient: Pamela Haley  Procedure(s) Performed: COLONOSCOPY POLYPECTOMY, INTESTINE  Patient Location: PACU  Anesthesia Type:General  Level of Consciousness: sedated  Airway & Oxygen  Therapy: Patient Spontanous Breathing  Post-op Assessment: Report given to RN and Post -op Vital signs reviewed and stable  Post vital signs: Reviewed and stable  Last Vitals:  Vitals Value Taken Time  BP    Temp    Pulse 81 03/04/24 1451  Resp 22 03/04/24 1451  SpO2 98 % 03/04/24 1451  Vitals shown include unfiled device data.  Last Pain:  Vitals:   03/04/24 1447  TempSrc: Temporal  PainSc: Asleep         Complications: No notable events documented.

## 2024-03-04 NOTE — H&P (Signed)
 [name], MD [address]  [address]  [place], [place] [address]  Main: [phone]  Fax: [phone] Pager: [phone]  Primary Care Physician:  Carollynn Cirri, NP Primary Gastroenterologist:  Dr. [name]  Pre-Procedure History & Physical: HPI:  Pamela Haley is a 73 y.o. female is here for an colonoscopy.   Past Medical History:  Diagnosis Date   Abnormal EKG    HX OF PREVIOUS EKGS SHOWING POSSIBLE MI--BUT PT HAS NEVER HAD ANY HEART PROBLEMS--HAD BACK SURGERY AUG 2012 AT [facility] - NO HEART PROBLEMS   Atherosclerosis of native artery of both lower extremities with intermittent claudication (HCC)    Chronic cough    PT IS A SMOKER   COPD (chronic obstructive pulmonary disease) (HCC)    DDD (degenerative disc disease), lumbar    PAIN IN LOWER BACK AND DOWN RT HIP AND LEG--NUMBNESS, THROBBING, BURNING   Grade II diastolic dysfunction    Hyperthyroidism 02/20/2015   Occluded aorta (HCC)    PAD (peripheral artery disease) (HCC)    PAD (peripheral artery disease) (HCC)     Past Surgical History:  Procedure Laterality Date   ABDOMINAL AORTOGRAM W/LOWER EXTREMITY N/A 02/12/2023   Procedure: ABDOMINAL AORTOGRAM W/LOWER EXTREMITY;  Surgeon: Wenona Hamilton, MD;  Location: [facility] INVASIVE CV LAB;  Service: Cardiovascular;  Laterality: N/A;   BACK SURGERY  05/2011   lumbar surgery at [facility]   BILATERAL CARPAL TUNNEL RELEASE     C SECTIONS X 3     CATARACT EXTRACTION W/PHACO Left 10/20/2023   Procedure: CATARACT EXTRACTION PHACO AND INTRAOCULAR LENS PLACEMENT (IOC) LEFT 8.35 00:44.3;  Surgeon: Clair Crews, MD;  Location: [facility];  Service: Ophthalmology;  Laterality: Left;   CATARACT EXTRACTION W/PHACO Right 11/04/2023   Procedure: CATARACT EXTRACTION PHACO AND INTRAOCULAR LENS PLACEMENT (IOC) RIGHT 7.52 00:50.5;  Surgeon: Clair Crews, MD;  Location: [facility];  Service: Ophthalmology;  Laterality: Right;    Prior to Admission  medications   Medication Sig Start Date End Date Taking? Authorizing Provider  acetaminophen  (TYLENOL ) 325 MG tablet Take 650 mg by mouth every 6 (six) hours as needed.   Yes [provider]  rosuvastatin  (CRESTOR ) 40 MG tablet Take 1 tablet (40 mg total) by mouth daily. 01/28/24 01/22/25 Yes Ronald Cockayne, NP  venlafaxine  XR (EFFEXOR  XR) 37.5 MG 24 hr capsule Take 1 capsule (37.5 mg total) by mouth daily with breakfast. 01/19/24  Yes Baity, Rankin Buzzard, NP  albuterol  (VENTOLIN  HFA) 108 (90 Base) MCG/ACT inhaler Inhale 1-2 puffs into the lungs every 6 (six) hours as needed for wheezing or shortness of breath. 12/09/22   Marc Senior, MD  aspirin  EC 81 MG tablet Take 1 tablet (81 mg total) by mouth daily. Swallow whole. 01/14/23   Wenona Hamilton, MD  budeson-glycopyrrolate -formoterol  (BREZTRI  AEROSPHERE) 160-9-4.8 MCG/ACT AERO Inhale 2 puffs into the lungs in the morning and at bedtime. 02/02/24   Marc Senior, MD  cilostazol  (PLETAL ) 100 MG tablet TAKE 1 TABLET BY MOUTH TWICE DAILY BEFORE A MEAL 01/07/24   Robins, Joshua E, MD  cyclobenzaprine  (FLEXERIL ) 10 MG tablet Take 10 mg by mouth 2 (two) times daily as needed for muscle spasms. 05/23/21   [provider]  potassium chloride  (KLOR-CON ) 10 MEQ tablet Take 1 tablet (10 mEq total) by mouth daily. 02/07/23   Wenona Hamilton, MD    Allergies as of 01/19/2024   (No Known Allergies)    Family History  Problem Relation  Age of Onset   Heart disease Mother    Heart attack Father    Heart disease Father    Stroke Maternal Grandmother    Diabetes Maternal Grandmother     Social History   Socioeconomic History   Marital status: Single    Spouse name: Not on file   Number of children: Not on file   Years of education: Not on file   Highest education level: Associate degree: occupational, Scientist, product/process development, or vocational program  Occupational History   Not on file  Tobacco Use   Smoking status: Every Day    Current  packs/day: 0.50    Average packs/day: 0.3 packs/day for 32.0 years (8.5 ttl pk-yrs)    Types: Cigarettes    Start date: 03/13/1992    Last attempt to quit: 03/13/2022   Smokeless tobacco: Never   Tobacco comments:    10 cigarettes daily. Khj 10/14/2023   Vaping Use   Vaping status: Never Used  Substance and Sexual Activity   Alcohol use: Yes    Alcohol/week: 0.0 standard drinks of alcohol    Comment: rare   Drug use: No   Sexual activity: Not Currently  Other Topics Concern   Not on file  Social History Narrative   Not on file   Social Drivers of Health   Financial Resource Strain: Low Risk  (01/02/2024)   Overall Financial Resource Strain (CARDIA)    Difficulty of Paying Living Expenses: Not hard at all  Food Insecurity: No Food Insecurity (01/02/2024)   Hunger Vital Sign    Worried About Running Out of Food in the Last Year: Never true    Ran Out of Food in the Last Year: Never true  Recent Concern: Food Insecurity - Food Insecurity Present (12/05/2023)   Hunger Vital Sign    Worried About Running Out of Food in the Last Year: Sometimes true    Ran Out of Food in the Last Year: Never true  Transportation Needs: No Transportation Needs (01/02/2024)   PRAPARE - Administrator, Civil Service (Medical): No    Lack of Transportation (Non-Medical): No  Physical Activity: Insufficiently Active (01/02/2024)   Exercise Vital Sign    Days of Exercise per Week: 2 days    Minutes of Exercise per Session: 20 min  Stress: No Stress Concern Present (01/02/2024)   [employer] of Occupational Health - Occupational Stress Questionnaire    Feeling of Stress : Only a little  Social Connections: Socially Isolated (01/02/2024)   Social Connection and Isolation Panel [NHANES]    Frequency of Communication with Friends and Family: More than three times a week    Frequency of Social Gatherings with Friends and Family: More than three times a week    Attends Religious Services: Never     Database administrator or Organizations: No    Attends Banker Meetings: Never    Marital Status: Divorced  Catering manager Violence: Not At Risk (01/02/2024)   Humiliation, Afraid, Rape, and Kick questionnaire    Fear of Current or Ex-Partner: No    Emotionally Abused: No    Physically Abused: No    Sexually Abused: No    Review of Systems: See HPI, otherwise negative ROS  Physical Exam: BP (!) 142/71   Pulse 91   Temp (!) 97.4 F (36.3 C) (Temporal)   Resp 16   Ht 5\' 5"  (1.651 m)   Wt 41.7 kg   SpO2 97%   BMI 15.31  kg/m  General:   Alert,  pleasant and cooperative in NAD Head:  Normocephalic and atraumatic. Neck:  Supple; no masses or thyromegaly. Lungs:  Clear throughout to auscultation.    Heart:  Regular rate and rhythm. Abdomen:  Soft, nontender and nondistended. Normal bowel sounds, without guarding, and without rebound.   Neurologic:  Alert and  oriented x4;  grossly normal neurologically.  Impression/Plan: Pamela Haley is here for an colonoscopy to be performed for cologuard positive  Risks, benefits, limitations, and alternatives regarding  colonoscopy have been reviewed with the patient.  Questions have been answered.  All parties agreeable.   Ellis Guys, MD  03/04/2024, 1:56 PM

## 2024-03-05 ENCOUNTER — Encounter: Payer: Self-pay | Admitting: Gastroenterology

## 2024-03-05 ENCOUNTER — Other Ambulatory Visit: Payer: Self-pay

## 2024-03-05 DIAGNOSIS — J449 Chronic obstructive pulmonary disease, unspecified: Secondary | ICD-10-CM

## 2024-03-05 LAB — SURGICAL PATHOLOGY

## 2024-03-05 NOTE — Anesthesia Postprocedure Evaluation (Signed)
 Anesthesia Post Note  Patient: Pamela Haley  Procedure(s) Performed: COLONOSCOPY POLYPECTOMY, INTESTINE  Patient location during evaluation: Endoscopy Anesthesia Type: General Level of consciousness: awake and alert Pain management: pain level controlled Vital Signs Assessment: post-procedure vital signs reviewed and stable Respiratory status: spontaneous breathing, nonlabored ventilation, respiratory function stable and patient connected to nasal cannula oxygen  Cardiovascular status: blood pressure returned to baseline and stable Postop Assessment: no apparent nausea or vomiting Anesthetic complications: no   No notable events documented.   Last Vitals:  Vitals:   03/04/24 1447 03/04/24 1457  BP: (!) 117/53 (!) 114/57  Pulse: 82 84  Resp: 16 20  Temp: (!) 35.8 C   SpO2: 98% 100%    Last Pain:  Vitals:   03/04/24 1457  TempSrc:   PainSc: 0-No pain                 Lattie Poli

## 2024-03-08 ENCOUNTER — Encounter: Payer: Self-pay | Admitting: Gastroenterology

## 2024-03-09 ENCOUNTER — Encounter: Payer: Self-pay | Admitting: Pulmonary Disease

## 2024-03-09 ENCOUNTER — Ambulatory Visit (INDEPENDENT_AMBULATORY_CARE_PROVIDER_SITE_OTHER): Admitting: Pulmonary Disease

## 2024-03-09 ENCOUNTER — Encounter: Payer: Self-pay | Admitting: Emergency Medicine

## 2024-03-09 VITALS — BP 110/58 | HR 119 | Temp 96.9°F | Ht 65.0 in | Wt 93.4 lb

## 2024-03-09 DIAGNOSIS — J449 Chronic obstructive pulmonary disease, unspecified: Secondary | ICD-10-CM

## 2024-03-09 DIAGNOSIS — F17210 Nicotine dependence, cigarettes, uncomplicated: Secondary | ICD-10-CM | POA: Diagnosis not present

## 2024-03-09 LAB — PULMONARY FUNCTION TEST
DL/VA % pred: 44 %
DL/VA: 1.82 ml/min/mmHg/L
DLCO unc % pred: 42 %
DLCO unc: 8.44 ml/min/mmHg
FEF 25-75 Post: 0.55 L/s
FEF 25-75 Pre: 0.43 L/s
FEF2575-%Change-Post: 28 %
FEF2575-%Pred-Post: 29 %
FEF2575-%Pred-Pre: 23 %
FEV1-%Change-Post: 9 %
FEV1-%Pred-Post: 50 %
FEV1-%Pred-Pre: 45 %
FEV1-Post: 1.16 L
FEV1-Pre: 1.05 L
FEV1FVC-%Change-Post: 2 %
FEV1FVC-%Pred-Pre: 59 %
FEV6-%Change-Post: 8 %
FEV6-%Pred-Post: 83 %
FEV6-%Pred-Pre: 77 %
FEV6-Post: 2.44 L
FEV6-Pre: 2.25 L
FEV6FVC-%Change-Post: 1 %
FEV6FVC-%Pred-Post: 101 %
FEV6FVC-%Pred-Pre: 99 %
FVC-%Change-Post: 6 %
FVC-%Pred-Post: 82 %
FVC-%Pred-Pre: 77 %
FVC-Post: 2.52 L
FVC-Pre: 2.36 L
Post FEV1/FVC ratio: 46 %
Post FEV6/FVC ratio: 97 %
Pre FEV1/FVC ratio: 45 %
Pre FEV6/FVC Ratio: 95 %

## 2024-03-09 MED ORDER — ALBUTEROL SULFATE HFA 108 (90 BASE) MCG/ACT IN AERS: 2.0000 | INHALATION_SPRAY | Freq: Four times a day (QID) | RESPIRATORY_TRACT | 3 refills | Status: DC | PRN

## 2024-03-09 NOTE — Progress Notes (Signed)
 PFT without pleth due to pt claustrophobia.

## 2024-03-09 NOTE — Progress Notes (Addendum)
 Subjective:    Patient ID: Pamela Haley, female    DOB: 15-Mar-1951, 73 y.o.   MRN: [id number]  Patient Care Team: Carollynn Cirri, NP as PCP - General (Internal Medicine) Wenona Hamilton, MD as PCP - Cardiology (Cardiology) Marc Senior, MD as Consulting Physician (Pulmonary Disease) Pa, [facility])  Chief Complaint  Patient presents with   Follow-up    No SOB. Occasional wheezing. Cough with clear sputum.    BACKGROUND/INTERVAL: 73 year old female, current every day smoker. PMH significant for stage 3 COPD, HTN, HLD, prediabetes, tobacco abuse. Last seen on 14 October 2023 by me.    HPI Discussed the use of AI scribe software for clinical note transcription with the patient, who gave verbal consent to proceed.  History of Present Illness   Pamela Haley is a 73 year old female with chronic obstructive pulmonary disease (COPD) who presents for a follow-up on her breathing and medication management.  Her breathing has been stable but is affected by weather conditions, particularly humidity, which makes it feel 'oppressive'. She tries to stay indoors with air conditioning during such times.  She is currently using Breztri , which she takes between 9 and 10 AM and again after dinner around 7 to 9 PM. She notices a difference in her breathing about 5 to 10 minutes after using Breztri . She also uses Ventolin  as needed, particularly in the mornings when she gets winded after taking her dog outside. She needs a refill for Ventolin  as it is running low.  She recently underwent a colonoscopy, which was her first, and she was anxious about the preparation process. She followed the instructions to drink the preparation solution at 5 PM after a liquid diet, and experienced the expected effects around 7:30 PM. She was relieved that the procedure itself was not as bad as anticipated.   She has been referred to lung cancer screening program previously however has  failed to make an appointment.  We discussed the importance of the program and that she will be referred to.  Her last chest x-ray was performed in 03 December 2023 consistent with COPD changes but no other abnormalities.  She unfortunately continues to smoke half a pack of cigarettes per day, she was counseled regards to discontinuation of smoking, she states that she will try to continue to decrease cigarette smoking.   She had PFTs performed today that showed an FEV1 of 1.05 L or 45% predicted, FVC of 2.36 L or 77% of predicted, FEV1/FVC 45%, moderate to severe diffusion capacity impairment.  Findings are consistent with severe obstructive defect emphysematous type.  I discussed the findings with the patient.    Review of Systems A 10 point review of systems was performed and it is as noted above otherwise negative.   Patient Active Problem List   Diagnosis Date Noted   Positive colorectal cancer screening using Cologuard test 03/04/2024   Colon polyposis 03/04/2024   Anxiety and depression 01/19/2024   Underweight (BMI < 18.5) 12/10/2023   PAD (peripheral artery disease) (HCC) 12/03/2023   Prediabetes 07/20/2021   HLD (hyperlipidemia) 07/20/2021   Protein calorie malnutrition (HCC) 04/10/2021   COPD (chronic obstructive pulmonary disease) (HCC) 01/25/2016   DDD (degenerative disc disease), lumbar 02/20/2015   Hyperthyroidism 02/20/2015    Social History   Tobacco Use   Smoking status: Every Day    Current packs/day: 0.50    Average packs/day: 0.3 packs/day for 32.0 years (8.5 ttl pk-yrs)  Types: Cigarettes    Start date: 03/13/1992    Last attempt to quit: 03/13/2022   Smokeless tobacco: Never   Tobacco comments:    10 cigarettes daily. Khj 03/09/2024  Substance Use Topics   Alcohol use: Yes    Alcohol/week: 0.0 standard drinks of alcohol    Comment: rare    No Known Allergies  Current Meds  Medication Sig   acetaminophen  (TYLENOL ) 325 MG tablet Take 650 mg by mouth  every 6 (six) hours as needed.   aspirin  EC 81 MG tablet Take 1 tablet (81 mg total) by mouth daily. Swallow whole.   budeson-glycopyrrolate -formoterol  (BREZTRI  AEROSPHERE) 160-9-4.8 MCG/ACT AERO Inhale 2 puffs into the lungs in the morning and at bedtime.   cilostazol  (PLETAL ) 100 MG tablet TAKE 1 TABLET BY MOUTH TWICE DAILY BEFORE A MEAL   cyclobenzaprine  (FLEXERIL ) 10 MG tablet Take 10 mg by mouth 2 (two) times daily as needed for muscle spasms.   dextromethorphan-guaiFENesin (MUCINEX DM) 30-600 MG 12hr tablet Take 1 tablet by mouth daily.   rosuvastatin  (CRESTOR ) 40 MG tablet Take 1 tablet (40 mg total) by mouth daily.   venlafaxine  XR (EFFEXOR  XR) 37.5 MG 24 hr capsule Take 1 capsule (37.5 mg total) by mouth daily with breakfast.   [DISCONTINUED] albuterol  (VENTOLIN  HFA) 108 (90 Base) MCG/ACT inhaler Inhale 1-2 puffs into the lungs every 6 (six) hours as needed for wheezing or shortness of breath.   [DISCONTINUED] potassium chloride  (KLOR-CON ) 10 MEQ tablet Take 1 tablet (10 mEq total) by mouth daily.    Immunization History  Administered Date(s) Administered   Fluad Quad(high Dose 65+) 07/26/2019, 08/02/2022   Fluad Trivalent(High Dose 65+) 07/04/2023   Influenza, High Dose Seasonal PF 08/25/2018, 07/10/2021   Influenza,inj,Quad PF,6+ Mos 09/11/2016   Influenza-Unspecified 07/17/2023   Moderna Covid-19 Vaccine  Bivalent Booster 4yrs & up 07/10/2021   Moderna SARS-COV2 Booster Vaccination 08/21/2020, 08/02/2022   Moderna Sars-Covid-2 Vaccination 12/02/2019, 12/29/2019, 08/11/2020   Pneumococcal Conjugate-13 08/25/2018   Unspecified SARS-COV-2 Vaccination 07/17/2023        Objective:     BP (!) 110/58 (BP Location: Right Arm, Cuff Size: Normal)   Pulse (!) 119   Temp (!) 96.9 F (36.1 C)   Ht 5\' 5"  (1.651 m)   Wt 93 lb 6.4 oz (42.4 kg)   SpO2 93%   BMI 15.54 kg/m   SpO2: 93 % O2 Device: None (Room air)  GENERAL: Slender, well-developed woman, no acute distress. HEAD:  Normocephalic, atraumatic.  EYES: Pupils equal, round, reactive to light.  No scleral icterus.  Conjunctiva mildly injected, MOUTH: Oral mucosa moist.  No thrush. NECK: Supple. No thyromegaly. Trachea midline. No JVD.  No adenopathy. PULMONARY: Good air entry bilaterally.  Coarse otherwise, no adventitious sounds. CARDIOVASCULAR: S1 and S2.  Slightly tachycardic.  No rubs, murmurs or gallops heard. ABDOMEN: Benign. MUSCULOSKELETAL: No joint deformity, no clubbing, no edema.  NEUROLOGIC: Neuro normal. SKIN: Intact,warm,dry. PSYCH: Mood and behavior normal.   Recent Results (from the past 2160 hours)  Pulmonary function test     Status: None   Collection Time: 03/09/24  2:43 PM  Result Value Ref Range   FVC-Pre 2.36 L   FVC-%Pred-Pre 77 %   FVC-Post 2.52 L   FVC-%Pred-Post 82 %   FVC-%Change-Post 6 %   FEV1-Pre 1.05 L   FEV1-%Pred-Pre 45 %   FEV1-Post 1.16 L   FEV1-%Pred-Post 50 %   FEV1-%Change-Post 9 %   FEV6-Pre 2.25 L   FEV6-%Pred-Pre 77 %  FEV6-Post 2.44 L   FEV6-%Pred-Post 83 %   FEV6-%Change-Post 8 %   Pre FEV1/FVC ratio 45 %   FEV1FVC-%Pred-Pre 59 %   Post FEV1/FVC ratio 46 %   FEV1FVC-%Change-Post 2 %   Pre FEV6/FVC Ratio 95 %   FEV6FVC-%Pred-Pre 99 %   Post FEV6/FVC ratio 97 %   FEV6FVC-%Pred-Post 101 %   FEV6FVC-%Change-Post 1 %   FEF 25-75 Pre 0.43 L/sec   FEF2575-%Pred-Pre 23 %   FEF 25-75 Post 0.55 L/sec   FEF2575-%Pred-Post 29 %   FEF2575-%Change-Post 28 %   DLCO unc 8.44 ml/min/mmHg   DLCO unc % pred 42 %   DL/VA 7.82 ml/min/mmHg/L   DL/VA % pred 44 %  *Findings consistent with severe airway obstruction with diffusion capacity impairment consistent with emphysema patient is stage III COPD.  Patient is aware of the findings.       Assessment & Plan:     ICD-10-CM   1. Stage 3 severe COPD by GOLD classification (HCC)  J44.9     2. Tobacco dependence due to cigarettes  F17.210 Ambulatory Referral for Lung Cancer Scre      Orders Placed This  Encounter  Procedures   Ambulatory Referral for Lung Cancer Scre    Referral Priority:   Routine    Referral Type:   Consultation    Referral Reason:   Specialty Services Required    Number of Visits Requested:   1    Meds ordered this encounter  Medications   albuterol  (VENTOLIN  HFA) 108 (90 Base) MCG/ACT inhaler    Sig: Inhale 2 puffs into the lungs every 6 (six) hours as needed for wheezing or shortness of breath.    Dispense:  17 g    Refill:  3   Discussion:    Chronic obstructive pulmonary disease (COPD) COPD is well-managed with no deterioration in lung function since 2022. Symptoms are exacerbated by humidity, leading to increased dyspnea. Breztri  provides effective relief within 5-10 minutes. Ventolin  is used occasionally, primarily in the morning or during humid conditions. - Continue Breztri  as prescribed, morning and evening. - Refill Ventolin  prescription. - Enroll in lung cancer screening program.  Tobacco use Continued tobacco use adversely affects COPD management and overall health. Emphasis on the importance of smoking cessation. - Encourage smoking cessation.     Advised if symptoms do not improve or worsen, to please contact office for sooner follow up or seek emergency care.    I spent 34 minutes of dedicated to the care of this patient on the date of this encounter to include pre-visit review of records, face-to-face time with the patient discussing conditions above, post visit ordering of testing, clinical documentation with the electronic health record, making appropriate referrals as documented, and communicating necessary findings to members of the patients care team.     C. Chloe Counter, MD Advanced Bronchoscopy PCCM [facility]    *This note was generated using voice recognition software/Dragon and/or AI transcription program.  Despite best efforts to proofread, errors can occur which can change the meaning. Any transcriptional  errors that result from this process are unintentional and may not be fully corrected at the time of dictation.

## 2024-03-09 NOTE — Patient Instructions (Signed)
 PFT complete without pleth.

## 2024-03-09 NOTE — Patient Instructions (Signed)
 VISIT SUMMARY:  Today, you came in for a follow-up on your breathing and medication management for your chronic obstructive pulmonary disease (COPD). We discussed how your breathing is stable but affected by weather conditions, particularly humidity. You also shared your experience with your recent colonoscopy.  YOUR PLAN:  -CHRONIC OBSTRUCTIVE PULMONARY DISEASE (COPD): COPD is a chronic lung disease that makes it hard to breathe. Your condition is well-managed with your current medications, Breztri  and Ventolin . Continue taking Breztri  in the morning and evening as prescribed. We will refill your Ventolin  prescription for use as needed, especially in the mornings or during humid conditions. Additionally, you should enroll in a lung cancer screening program.  -TOBACCO USE: Continued tobacco use can worsen your COPD and overall health. It is important to stop smoking to improve your breathing and general well-being. We strongly encourage you to quit smoking.  INSTRUCTIONS:  Please continue taking your medications as prescribed. We will refill your Ventolin  prescription. Also, enroll in a lung cancer screening program. If you need help with smoking cessation, please let us  know.

## 2024-03-20 ENCOUNTER — Other Ambulatory Visit: Payer: Self-pay | Admitting: Cardiovascular Disease

## 2024-03-27 ENCOUNTER — Encounter: Payer: Self-pay | Admitting: Pulmonary Disease

## 2024-04-18 ENCOUNTER — Other Ambulatory Visit: Payer: Self-pay | Admitting: Vascular Surgery

## 2024-05-17 ENCOUNTER — Other Ambulatory Visit: Payer: Self-pay

## 2024-05-17 MED ORDER — CILOSTAZOL 100 MG PO TABS: 100.0000 mg | ORAL_TABLET | Freq: Two times a day (BID) | ORAL | 0 refills | Status: DC

## 2024-06-08 ENCOUNTER — Ambulatory Visit: Payer: Medicare Other | Admitting: Internal Medicine

## 2024-06-08 NOTE — Progress Notes (Deleted)
 HPI:  Pt presents to the clinic today for follow up chronic conditions.  COPD: Pt reports chronic cough and SOB. She does continue to smoke. She is using breztri  and albuterol  as prescribed. PFT's from 02/2024 reviewed. She follows with pulmonology.  DDD, lumbar: s/p back surgery. Managed with ibuprofen  and cyclobenzaprine  as needed. She stretches daily. She does not follow with orthopedics or neurosurgery.  Hyperthyroidism: Not medicated. She does not follow with endocrinology.   HLD with aortic atherosclerosis, PAD: her last LDL was 93, triglycerides 65, 06/2023. She denies claudication. She denies myalgias on rosuvastatin , pletal  and aspirin . She follows with cardiology and vascular.  Prediabetes: Her last A1C was 6 %,/2025. She is not taking any oral diabetic medication at this time. She does not check her sugars.  Polycythemia: Her last H/H was 13.5/38.9, 01/2024. She does smoke. She does not follow with hematology.  Anxiety and depression: Chronic, managed on venlafaxine .  She is not currently seeing a therapist.  She denies SI/HI.   Past Medical History:  Diagnosis Date   Abnormal EKG    HX OF PREVIOUS EKGS SHOWING POSSIBLE MI--BUT PT HAS NEVER HAD ANY HEART PROBLEMS--HAD BACK SURGERY AUG 2012 AT [facility] - NO HEART PROBLEMS   Atherosclerosis of native artery of both lower extremities with intermittent claudication (HCC)    Chronic cough    PT IS A SMOKER   COPD (chronic obstructive pulmonary disease) (HCC)    DDD (degenerative disc disease), lumbar    PAIN IN LOWER BACK AND DOWN RT HIP AND LEG--NUMBNESS, THROBBING, BURNING   Grade II diastolic dysfunction    Hyperthyroidism 02/20/2015   Occluded aorta (HCC)    PAD (peripheral artery disease) (HCC)    PAD (peripheral artery disease) (HCC)     Current Outpatient Medications  Medication Sig Dispense Refill   acetaminophen  (TYLENOL ) 325 MG tablet Take 650 mg by mouth every 6 (six) hours as needed.     albuterol  (VENTOLIN   HFA) 108 (90 Base) MCG/ACT inhaler Inhale 2 puffs into the lungs every 6 (six) hours as needed for wheezing or shortness of breath. 17 g 3   aspirin  EC 81 MG tablet Take 1 tablet (81 mg total) by mouth daily. Swallow whole. 90 tablet 3   budeson-glycopyrrolate -formoterol  (BREZTRI  AEROSPHERE) 160-9-4.8 MCG/ACT AERO Inhale 2 puffs into the lungs in the morning and at bedtime. 10.7 g 11   cilostazol  (PLETAL ) 100 MG tablet Take 1 tablet (100 mg total) by mouth 2 (two) times daily before a meal. 60 tablet 0   cyclobenzaprine  (FLEXERIL ) 10 MG tablet Take 10 mg by mouth 2 (two) times daily as needed for muscle spasms.     dextromethorphan-guaiFENesin (MUCINEX DM) 30-600 MG 12hr tablet Take 1 tablet by mouth daily.     potassium chloride  (KLOR-CON ) 10 MEQ tablet Take 1 tablet by mouth once daily 90 tablet 3   rosuvastatin  (CRESTOR ) 40 MG tablet Take 1 tablet (40 mg total) by mouth daily. 90 tablet 3   venlafaxine  XR (EFFEXOR  XR) 37.5 MG 24 hr capsule Take 1 capsule (37.5 mg total) by mouth daily with breakfast. 90 capsule 1   No current facility-administered medications for this visit.    No Known Allergies  Family History  Problem Relation Age of Onset   Heart disease Mother    Heart attack Father    Heart disease Father    Stroke Maternal Grandmother    Diabetes Maternal Grandmother     Social History   Socioeconomic History  Marital status: Single    Spouse name: Not on file   Number of children: Not on file   Years of education: Not on file   Highest education level: Associate degree: occupational, technical, or vocational program  Occupational History   Not on file  Tobacco Use   Smoking status: Every Day    Current packs/day: 0.50    Average packs/day: 0.3 packs/day for 32.2 years (8.6 ttl pk-yrs)    Types: Cigarettes    Start date: 03/13/1992    Last attempt to quit: 03/13/2022   Smokeless tobacco: Never   Tobacco comments:    10 cigarettes daily. Khj 03/09/2024  Vaping Use    Vaping status: Never Used  Substance and Sexual Activity   Alcohol use: Yes    Alcohol/week: 0.0 standard drinks of alcohol    Comment: rare   Drug use: No   Sexual activity: Not Currently  Other Topics Concern   Not on file  Social History Narrative   Not on file   Social Drivers of Health   Financial Resource Strain: Low Risk  (01/02/2024)   Overall Financial Resource Strain (CARDIA)    Difficulty of Paying Living Expenses: Not hard at all  Food Insecurity: No Food Insecurity (01/02/2024)   Hunger Vital Sign    Worried About Running Out of Food in the Last Year: Never true    Ran Out of Food in the Last Year: Never true  Recent Concern: Food Insecurity - Food Insecurity Present (12/05/2023)   Hunger Vital Sign    Worried About Running Out of Food in the Last Year: Sometimes true    Ran Out of Food in the Last Year: Never true  Transportation Needs: No Transportation Needs (01/02/2024)   PRAPARE - Administrator, Civil Service (Medical): No    Lack of Transportation (Non-Medical): No  Physical Activity: Insufficiently Active (01/02/2024)   Exercise Vital Sign    Days of Exercise per Week: 2 days    Minutes of Exercise per Session: 20 min  Stress: No Stress Concern Present (01/02/2024)   [employer] of Occupational Health - Occupational Stress Questionnaire    Feeling of Stress : Only a little  Social Connections: Socially Isolated (01/02/2024)   Social Connection and Isolation Panel    Frequency of Communication with Friends and Family: More than three times a week    Frequency of Social Gatherings with Friends and Family: More than three times a week    Attends Religious Services: Never    Database administrator or Organizations: No    Attends Banker Meetings: Never    Marital Status: Divorced  Catering manager Violence: Not At Risk (01/02/2024)   Humiliation, Afraid, Rape, and Kick questionnaire    Fear of Current or Ex-Partner: No    Emotionally  Abused: No    Physically Abused: No    Sexually Abused: No    Subjective:   Review of Systems:   Constitutional: Denies fever, malaise, fatigue, headache or abrupt weight changes.  HEENT: Denies eye pain, eye redness, ear pain, ringing in the ears, wax buildup, runny nose, nasal congestion, bloody nose, or sore throat. Respiratory: Pt reports chronic cough, shortness of breath. Denies difficulty breathing, or sputum production.   Cardiovascular: Denies chest pain, chest tightness, palpitations or swelling in the hands or feet.  Gastrointestinal: Denies abdominal pain, bloating, constipation, diarrhea or blood in the stool.  GU: Denies urgency, frequency, pain with urination, burning sensation, blood in  urine, odor or discharge. Musculoskeletal: Pt reports chronic back pain. Denies decrease in range of motion, difficulty with gait, or joint swelling.  Skin: Denies redness, rashes, lesions or ulcercations.  Neurological: Denies dizziness, difficulty with memory, difficulty with speech or problems with balance and coordination.  Psych: Patient has a history of anxiety and depression.  Denies SI/HI.  No other specific complaints in a complete review of systems (except as listed in HPI above).  Objective:  PE:  There were no vitals taken for this visit.   Wt Readings from Last 3 Encounters:  03/09/24 93 lb 6.4 oz (42.4 kg)  03/09/24 93 lb 6.4 oz (42.4 kg)  03/04/24 92 lb (41.7 kg)    General: Appears her stated age, chronically ill appearing, in NAD. Skin: Warm, dry and intact. No rashes noted. HEENT: Head: normal shape and size; Eyes: sclera white, no icterus, conjunctiva pink, PERRLA and EOMs intact;  Neck: Neck supple, trachea midline. No masses, lumps present.  Cardiovascular: Tachycardic with normal rhythm. S1,S2 noted.  No murmur, rubs or gallops noted. No JVD or BLE edema.  Pulmonary/Chest: Normal effort and coarse breath sounds with bilateral expiratory wheezing noted. No  respiratory distress. No rales or ronchi noted.  Abdomen: Soft and nontender.  Musculoskeletal: Gait slow and steady without use of cane. Neurological: Alert and oriented. Coordination normal.  Psychiatric: Mood is normal. Behavior is normal. Judgment and thought content normal.    BMET    Component Value Date/Time   NA 141 07/16/2023 0000   K 4.4 07/16/2023 0000   CL 103 07/16/2023 0000   CO2 24 07/16/2023 0000   GLUCOSE 95 07/16/2023 0000   GLUCOSE 107 (H) 02/12/2023 0859   BUN 10 07/16/2023 0000   CREATININE 0.85 07/16/2023 0000   CALCIUM  9.3 07/16/2023 0000   GFRNONAA >60 02/12/2023 0859   GFRAA 93 07/20/2020 1541    Lipid Panel     Component Value Date/Time   CHOL 173 07/16/2023 0000   TRIG 65 07/16/2023 0000   HDL 67 07/16/2023 0000   CHOLHDL 2.6 07/16/2023 0000   LDLCALC 93 07/16/2023 0000    CBC    Component Value Date/Time   WBC 8.7 07/16/2023 0000   WBC 12.3 (H) 02/05/2023 1050   RBC 5.22 07/16/2023 0000   RBC 5.35 (H) 02/05/2023 1050   HGB 16.2 (H) 07/16/2023 0000   HCT 48.8 (H) 07/16/2023 0000   PLT 279 07/16/2023 0000   MCV 94 07/16/2023 0000   MCH 31.0 07/16/2023 0000   MCH 30.5 02/05/2023 1050   MCHC 33.2 07/16/2023 0000   MCHC 32.5 02/05/2023 1050   RDW 12.5 07/16/2023 0000   LYMPHSABS 4.0 05/15/2012 1525   MONOABS 0.8 05/15/2012 1525   EOSABS 0.3 05/15/2012 1525   BASOSABS 0.1 05/15/2012 1525    Hgb A1C Lab Results  Component Value Date   HGBA1C 6.0 (A) 12/10/2023      Assessment and Plan:    RTC in 6 months for follow-up of chronic conditions  Angeline Laura, NP

## 2024-06-12 ENCOUNTER — Other Ambulatory Visit: Payer: Self-pay | Admitting: Vascular Surgery

## 2024-06-15 ENCOUNTER — Ambulatory Visit (INDEPENDENT_AMBULATORY_CARE_PROVIDER_SITE_OTHER): Admitting: Internal Medicine

## 2024-06-15 ENCOUNTER — Encounter: Payer: Self-pay | Admitting: Internal Medicine

## 2024-06-15 VITALS — BP 110/70 | HR 111 | Ht 65.0 in | Wt 89.2 lb

## 2024-06-15 DIAGNOSIS — E0590 Thyrotoxicosis, unspecified without thyrotoxic crisis or storm: Secondary | ICD-10-CM

## 2024-06-15 DIAGNOSIS — S31000A Unspecified open wound of lower back and pelvis without penetration into retroperitoneum, initial encounter: Secondary | ICD-10-CM

## 2024-06-15 DIAGNOSIS — I700 Atherosclerosis of aorta: Secondary | ICD-10-CM

## 2024-06-15 DIAGNOSIS — E782 Mixed hyperlipidemia: Secondary | ICD-10-CM | POA: Diagnosis not present

## 2024-06-15 DIAGNOSIS — Z23 Encounter for immunization: Secondary | ICD-10-CM

## 2024-06-15 DIAGNOSIS — E440 Moderate protein-calorie malnutrition: Secondary | ICD-10-CM

## 2024-06-15 DIAGNOSIS — I739 Peripheral vascular disease, unspecified: Secondary | ICD-10-CM | POA: Diagnosis not present

## 2024-06-15 DIAGNOSIS — R7303 Prediabetes: Secondary | ICD-10-CM

## 2024-06-15 DIAGNOSIS — D751 Secondary polycythemia: Secondary | ICD-10-CM | POA: Insufficient documentation

## 2024-06-15 DIAGNOSIS — Z681 Body mass index (BMI) 19 or less, adult: Secondary | ICD-10-CM

## 2024-06-15 DIAGNOSIS — J431 Panlobular emphysema: Secondary | ICD-10-CM

## 2024-06-15 DIAGNOSIS — M5136 Other intervertebral disc degeneration, lumbar region with discogenic back pain only: Secondary | ICD-10-CM

## 2024-06-15 DIAGNOSIS — F419 Anxiety disorder, unspecified: Secondary | ICD-10-CM

## 2024-06-15 DIAGNOSIS — F32A Depression, unspecified: Secondary | ICD-10-CM

## 2024-06-15 NOTE — Assessment & Plan Note (Signed)
 Will trial venlafaxine 37.5 mg daily Support offered

## 2024-06-15 NOTE — Assessment & Plan Note (Signed)
 TSH reviewed Will repeat at next visit

## 2024-06-15 NOTE — Assessment & Plan Note (Signed)
 C-Met and lipid profile today Encouraged her to consume a low-fat diet Continue rosuvastatin  40 mg daily and aspirin  81 mg

## 2024-06-15 NOTE — Assessment & Plan Note (Signed)
 Encouraged smoking cessation Continue breztri  160-9- 4.8 mcg and albuterol  as previously prescribed

## 2024-06-15 NOTE — Assessment & Plan Note (Signed)
 Encourage high protein, high calorie diet

## 2024-06-15 NOTE — Assessment & Plan Note (Signed)
 A1c today Encourage low-carb diet and exercise for weight loss

## 2024-06-15 NOTE — Patient Instructions (Signed)
 Atherosclerosis  Atherosclerosis is when plaque builds up in the arteries. This causes narrowing and hardening of the arteries. Arteries are blood vessels that carry blood from the heart to all parts of the body. This blood contains oxygen. Plaque occurs due to inflammation or from a buildup of fat, cholesterol, calcium, waste products of cells, and a clotting material in the blood (fibrin). Plaque decreases the amount of blood that can flow through the artery. Atherosclerosis can affect any artery in your body, including: Heart arteries. Damage to these arteries may lead to coronary artery disease, which can cause a heart attack. Brain arteries. Damage to these arteries may cause a stroke. Leg, arm, and pelvis arteries. Peripheral artery disease (PAD) may result from damage to these arteries. Kidney arteries. Kidney (renal) failure may result from damage to kidney arteries. Treatment may slow the disease and prevent further damage to your heart, brain, peripheral arteries, and kidneys. What are the causes? This condition develops slowly over many years. The inner layers of your arteries become damaged and allow the gradual buildup of plaque. The exact cause of atherosclerosis is not fully understood. Symptoms of atherosclerosis do not occur until an artery becomes narrow or blocked. What increases the risk? The following factors may make you more likely to develop this condition: Being middle-aged or older. Certain medical conditions, including: High blood pressure. High cholesterol. High blood fats (triglycerides). Diabetes. Sleep apnea. Obesity. Certain lab levels, including: Elevated C-reactive protein (CRP). This is a sign of increased inflammation in your body. Elevated homocysteine levels. This is an amino acid that is associated with heart and blood vessel disease. Using tobacco or nicotine products. A family history of atherosclerosis. Not exercising enough (sedentary  lifestyle). Being stressed. Drinking too much alcohol or using drugs, such as cocaine or methamphetamine. What are the signs or symptoms? Symptoms of atherosclerosis do not occur until the plaque severely narrows or blocks the artery, which decreases blood flow. Sometimes, atherosclerosis does not cause symptoms. Symptoms of this condition include: Coronary artery disease. This may cause chest pain and shortness of breath. Decreased blood supply to your brain, which may cause a stroke. Signs of a stroke may include sudden: Weakness or numbness in your face, arm, or leg, especially on one side of your body. Trouble walking or difficulty moving your arms or legs. Loss of balance or coordination. Confusion. Slurred speech. Trouble speaking, or trouble understanding speech, or both (aphasia). Vision changes in one or both eyes. This may be double vision, blurred vision, or loss of vision. Severe headache with no known cause. The headache is often described as the worst headache ever experienced. PAD, which may cause pain, numbness, or nonhealing wounds, often in your legs and hips. Renal failure. This may cause tiredness, problems with urination, swelling, and itchy skin. How is this diagnosed? This condition is diagnosed based on your medical history and a physical exam. During the exam, your health care provider will: Check your pulse in different places. Listen for a "whooshing" sound over your arteries (bruit). You may also have tests, such as: Blood tests to check your levels of cholesterol, triglycerides, blood sugar, and CRP. Ankle-brachial index to compare blood pressure in your arms to blood pressure in your ankles to see how your blood is flowing. Heart (cardiac) tests. Electrocardiogram (ECG) to check for heart damage. Stress test to see how your heart reacts to exercise. Ultrasound tests. Ultrasound of your peripheral arteries to check blood flow. Echocardiogram to get images of  your heart's  chambers and valves. X-ray tests. Chest X-ray to see if you have an enlarged heart, which is a sign of heart failure. CT scan to check for damage to your heart, brain, or arteries. Angiogram. This is a test where dye is injected and X-rays are used to see the blood flow in the arteries. How is this treated? This condition is treated with lifestyle changes as the first step. These may include: Changing your diet. Losing weight. Reducing stress. Exercising and being physically active more regularly. Quitting smoking. You may also need medicine to: Lower triglycerides and cholesterol. Control blood pressure. Prevent blood clots. Lower inflammation in your body. Control your blood sugar. Sometimes, surgery is needed to: Remove plaque from an artery (endarterectomy). Open or widen a narrowed heart artery or peripheral artery (angioplasty). Create a new path for your blood with one of these procedures: Heart (coronary) artery bypass graft surgery. Peripheral artery bypass graft surgery. Place a small mesh tube (stent) in an artery to open or widen a narrowed artery. Follow these instructions at home: Eating and drinking  Eat a heart-healthy diet. Talk with your health care provider or a dietitian if you need help. A heart-healthy diet involves: Limiting unhealthy fats and increasing healthy fats. Some examples of healthy fats are avocados and olive oil. Eating plant-based foods, such as fruits, vegetables, nuts, whole grains, and legumes (such as peas and lentils). If you drink alcohol: Limit how much you have to: 0-1 drink a day for women who are not pregnant. 0-2 drinks a day for men. Know how much alcohol is in a drink. In the [place] one drink equals one 12 oz bottle of beer (355 mL), one 5 oz glass of wine (148 mL), or one 1 oz glass of hard liquor (44 mL). Lifestyle  Maintain a healthy weight. Lose weight if your health care provider says that you need to do  that. Follow an exercise program as told by your health care provider. Do not use any products that contain nicotine or tobacco. These products include cigarettes, chewing tobacco, and vaping devices, such as e-cigarettes. If you need help quitting, ask your health care provider. Do not use drugs. General instructions Take over-the-counter and prescription medicines only as told by your health care provider. Manage other health conditions as told. Keep all follow-up visits. This is important. Contact a health care provider if you have: An irregular heartbeat. Unexplained tiredness (fatigue). Trouble urinating, or you are producing less urine or foamy urine. Swelling of your hands or feet, or itchy skin. Unexplained pain or numbness in your legs or hips. A wound that is slow to heal or is not healing. Get help right away if: You have any symptoms of a heart attack. These may be: Chest pain. This includes squeezing chest pain that may feel like indigestion (angina). Shortness of breath. Pain in your neck, jaw, arms, back, or stomach. Cold sweat. Nausea. Light-headedness. Sudden pain, numbness, or coldness in a limb. You have any symptoms of a stroke. "BE FAST" is an easy way to remember the main warning signs of a stroke: B - Balance. Signs are dizziness, sudden trouble walking, or loss of balance. E - Eyes. Signs are trouble seeing or a sudden change in vision. F - Face. Signs are sudden weakness or numbness of the face, or the face or eyelid drooping on one side. A - Arms. Signs are weakness or numbness in an arm. This happens suddenly and usually on one side of the body. S -  Speech. Signs are sudden trouble speaking, slurred speech, or trouble understanding what people say. T - Time. Time to call emergency services. Write down what time symptoms started. You have other signs of a stroke, such as: A sudden, severe headache with no known cause. Nausea or vomiting. Seizure. These  symptoms may represent a serious problem that is an emergency. Do not wait to see if the symptoms will go away. Get medical help right away. Call your local emergency services (911 in the [place] Do not drive yourself to the hospital. Summary Atherosclerosis is when plaque builds up in the arteries and causes narrowing and hardening of the arteries. Plaque occurs due to inflammation or from a buildup of fat, cholesterol, calcium, cellular waste products, and fibrin. This condition may not cause any symptoms. Symptoms of atherosclerosis do not occur until the plaque severely narrows or blocks the artery. Treatment starts with lifestyle changes and may include medicines. In some cases, surgery is needed. Get help right away if you have any symptoms of a heart attack or stroke. This information is not intended to replace advice given to you by your health care provider. Make sure you discuss any questions you have with your health care provider. Document Revised: 01/17/2021 Document Reviewed: 01/17/2021 Elsevier Patient Education  2024 [employer].

## 2024-06-15 NOTE — Assessment & Plan Note (Signed)
Encourage smoking cessation CBC today 

## 2024-06-15 NOTE — Assessment & Plan Note (Signed)
 Encourage regular stretching and core strengthening Continue ibuprofen  OTC and cyclobenzaprine  10 mg twice daily as previously prescribed

## 2024-06-15 NOTE — Assessment & Plan Note (Signed)
 Encouraged smoking cessation Continue rosuvastatin  40 mg daily, pletal  100 mg BID and aspirin  81 mg prescribed by cardiology

## 2024-06-15 NOTE — Progress Notes (Signed)
 HPI:  Pt presents to the clinic today for follow up chronic conditions.  COPD: Pt reports chronic cough and SOB. She does continue to smoke. She is using breztri  and albuterol  as prescribed. PFT's from 02/2024 reviewed. She follows with pulmonology.  DDD, lumbar: s/p back surgery. Managed with ibuprofen  and cyclobenzaprine  as needed. She stretches daily. She does not follow with orthopedics or neurosurgery.  Hyperthyroidism: Not medicated. She does not follow with endocrinology.   HLD with aortic atherosclerosis, PAD: Her last LDL was 93, triglycerides 65, 06/2023. She denies claudication. She denies myalgias on rosuvastatin , pletal  and aspirin . She follows with cardiology and vascular.  Prediabetes: Her last A1C was 6%,/2025. She is not taking any oral diabetic medication at this time. She does not check her sugars.  Polycythemia: Her last H/H was 13.5/38.9, 01/2024. She does smoke. She does not follow with hematology.  Anxiety and depression: Chronic, managed on venlafaxine . She attributes this to family stress.  She is not currently seeing a therapist.  She denies SI/HI.  She also reports a sore on her buttocks. She noticed this about 1 year ago. She is unsure if the area is open but has noticed that is weeps at time. She has tried destin, happy hiney OTC with minimal relief of symptoms.  Past Medical History:  Diagnosis Date   Abnormal EKG    HX OF PREVIOUS EKGS SHOWING POSSIBLE MI--BUT PT HAS NEVER HAD ANY HEART PROBLEMS--HAD BACK SURGERY AUG 2012 AT [facility] - NO HEART PROBLEMS   Atherosclerosis of native artery of both lower extremities with intermittent claudication (HCC)    Chronic cough    PT IS A SMOKER   COPD (chronic obstructive pulmonary disease) (HCC)    DDD (degenerative disc disease), lumbar    PAIN IN LOWER BACK AND DOWN RT HIP AND LEG--NUMBNESS, THROBBING, BURNING   Grade II diastolic dysfunction    Hyperthyroidism 02/20/2015   Occluded aorta (HCC)    PAD  (peripheral artery disease) (HCC)    PAD (peripheral artery disease) (HCC)     Current Outpatient Medications  Medication Sig Dispense Refill   acetaminophen  (TYLENOL ) 325 MG tablet Take 650 mg by mouth every 6 (six) hours as needed.     albuterol  (VENTOLIN  HFA) 108 (90 Base) MCG/ACT inhaler Inhale 2 puffs into the lungs every 6 (six) hours as needed for wheezing or shortness of breath. 17 g 3   aspirin  EC 81 MG tablet Take 1 tablet (81 mg total) by mouth daily. Swallow whole. 90 tablet 3   budeson-glycopyrrolate -formoterol  (BREZTRI  AEROSPHERE) 160-9-4.8 MCG/ACT AERO Inhale 2 puffs into the lungs in the morning and at bedtime. 10.7 g 11   cilostazol  (PLETAL ) 100 MG tablet TAKE 1 TABLET BY MOUTH TWICE DAILY BEFORE A MEAL 60 tablet 0   cyclobenzaprine  (FLEXERIL ) 10 MG tablet Take 10 mg by mouth 2 (two) times daily as needed for muscle spasms.     dextromethorphan-guaiFENesin (MUCINEX DM) 30-600 MG 12hr tablet Take 1 tablet by mouth daily.     potassium chloride  (KLOR-CON ) 10 MEQ tablet Take 1 tablet by mouth once daily 90 tablet 3   rosuvastatin  (CRESTOR ) 40 MG tablet Take 1 tablet (40 mg total) by mouth daily. 90 tablet 3   venlafaxine  XR (EFFEXOR  XR) 37.5 MG 24 hr capsule Take 1 capsule (37.5 mg total) by mouth daily with breakfast. 90 capsule 1   No current facility-administered medications for this visit.    No Known Allergies  Family History  Problem  Relation Age of Onset   Heart disease Mother    Heart attack Father    Heart disease Father    Stroke Maternal Grandmother    Diabetes Maternal Grandmother     Social History   Socioeconomic History   Marital status: Single    Spouse name: Not on file   Number of children: Not on file   Years of education: Not on file   Highest education level: Associate degree: occupational, Scientist, product/process development, or vocational program  Occupational History   Not on file  Tobacco Use   Smoking status: Every Day    Current packs/day: 0.50    Average  packs/day: 0.3 packs/day for 32.3 years (8.6 ttl pk-yrs)    Types: Cigarettes    Start date: 03/13/1992    Last attempt to quit: 03/13/2022   Smokeless tobacco: Never   Tobacco comments:    10 cigarettes daily. Khj 03/09/2024  Vaping Use   Vaping status: Never Used  Substance and Sexual Activity   Alcohol use: Yes    Alcohol/week: 0.0 standard drinks of alcohol    Comment: rare   Drug use: No   Sexual activity: Not Currently  Other Topics Concern   Not on file  Social History Narrative   Not on file   Social Drivers of Health   Financial Resource Strain: Low Risk  (01/02/2024)   Overall Financial Resource Strain (CARDIA)    Difficulty of Paying Living Expenses: Not hard at all  Food Insecurity: No Food Insecurity (01/02/2024)   Hunger Vital Sign    Worried About Running Out of Food in the Last Year: Never true    Ran Out of Food in the Last Year: Never true  Recent Concern: Food Insecurity - Food Insecurity Present (12/05/2023)   Hunger Vital Sign    Worried About Running Out of Food in the Last Year: Sometimes true    Ran Out of Food in the Last Year: Never true  Transportation Needs: No Transportation Needs (01/02/2024)   PRAPARE - Administrator, Civil Service (Medical): No    Lack of Transportation (Non-Medical): No  Physical Activity: Insufficiently Active (01/02/2024)   Exercise Vital Sign    Days of Exercise per Week: 2 days    Minutes of Exercise per Session: 20 min  Stress: No Stress Concern Present (01/02/2024)   [employer] of Occupational Health - Occupational Stress Questionnaire    Feeling of Stress : Only a little  Social Connections: Socially Isolated (01/02/2024)   Social Connection and Isolation Panel    Frequency of Communication with Friends and Family: More than three times a week    Frequency of Social Gatherings with Friends and Family: More than three times a week    Attends Religious Services: Never    Database administrator or Organizations:  No    Attends Banker Meetings: Never    Marital Status: Divorced  Catering manager Violence: Not At Risk (01/02/2024)   Humiliation, Afraid, Rape, and Kick questionnaire    Fear of Current or Ex-Partner: No    Emotionally Abused: No    Physically Abused: No    Sexually Abused: No    Subjective:   Review of Systems:   Constitutional: Denies fever, malaise, fatigue, headache or abrupt weight changes.  HEENT: Denies eye pain, eye redness, ear pain, ringing in the ears, wax buildup, runny nose, nasal congestion, bloody nose, or sore throat. Respiratory: Pt reports chronic cough, shortness of breath. Denies difficulty  breathing, or sputum production.   Cardiovascular: Denies chest pain, chest tightness, palpitations or swelling in the hands or feet.  Gastrointestinal: Denies abdominal pain, bloating, constipation, diarrhea or blood in the stool.  GU: Denies urgency, frequency, pain with urination, burning sensation, blood in urine, odor or discharge. Musculoskeletal: Pt reports chronic back pain. Denies decrease in range of motion, difficulty with gait, or joint swelling.  Skin: Pt reports sacral wound. Denies redness, rashes, or ulcercations.  Neurological: Denies dizziness, difficulty with memory, difficulty with speech or problems with balance and coordination.  Psych: Patient has a history of anxiety and depression.  Denies SI/HI.  No other specific complaints in a complete review of systems (except as listed in HPI above).  Objective:  PE:  Blood pressure 110/70, pulse (!) 111, height 5' 5 (1.651 m), weight 89 lb 4 oz (40.5 kg), SpO2 93%. s   Wt Readings from Last 3 Encounters:  03/09/24 93 lb 6.4 oz (42.4 kg)  03/09/24 93 lb 6.4 oz (42.4 kg)  03/04/24 92 lb (41.7 kg)    General: Appears her stated age, chronically ill appearing, underweight, in NAD. Skin: Warm, dry and intact. Senile purpura noted of BUE. 0.5 cm open sacral wound, not draining. Bluish  discoloration noted of BLE. HEENT: Head: normal shape and size; Eyes: sclera white, no icterus, conjunctiva pink, PERRLA and EOMs intact;  Neck: Neck supple, trachea midline. No masses, lumps present.  Cardiovascular: Tachycardic with normal rhythm. S1,S2 noted.  No murmur, rubs or gallops noted.  Cap refill 4 seconds of BLE.  No JVD or BLE edema.  Pulmonary/Chest: Normal effort and coarse breath sounds with intermittent expiratory wheezing noted. No respiratory distress. No rales or ronchi noted.  Musculoskeletal: Generalized muscle wasting noted. Gait slow and steady without use of cane. Neurological: Alert and oriented. Coordination normal.  Psychiatric: Mood is normal. Behavior is normal. Judgment and thought content normal.    BMET    Component Value Date/Time   NA 141 07/16/2023 0000   K 4.4 07/16/2023 0000   CL 103 07/16/2023 0000   CO2 24 07/16/2023 0000   GLUCOSE 95 07/16/2023 0000   GLUCOSE 107 (H) 02/12/2023 0859   BUN 10 07/16/2023 0000   CREATININE 0.85 07/16/2023 0000   CALCIUM  9.3 07/16/2023 0000   GFRNONAA >60 02/12/2023 0859   GFRAA 93 07/20/2020 1541    Lipid Panel     Component Value Date/Time   CHOL 173 07/16/2023 0000   TRIG 65 07/16/2023 0000   HDL 67 07/16/2023 0000   CHOLHDL 2.6 07/16/2023 0000   LDLCALC 93 07/16/2023 0000    CBC    Component Value Date/Time   WBC 8.7 07/16/2023 0000   WBC 12.3 (H) 02/05/2023 1050   RBC 5.22 07/16/2023 0000   RBC 5.35 (H) 02/05/2023 1050   HGB 16.2 (H) 07/16/2023 0000   HCT 48.8 (H) 07/16/2023 0000   PLT 279 07/16/2023 0000   MCV 94 07/16/2023 0000   MCH 31.0 07/16/2023 0000   MCH 30.5 02/05/2023 1050   MCHC 33.2 07/16/2023 0000   MCHC 32.5 02/05/2023 1050   RDW 12.5 07/16/2023 0000   LYMPHSABS 4.0 05/15/2012 1525   MONOABS 0.8 05/15/2012 1525   EOSABS 0.3 05/15/2012 1525   BASOSABS 0.1 05/15/2012 1525    Hgb A1C Lab Results  Component Value Date   HGBA1C 6.0 (A) 12/10/2023      Assessment and  Plan:   Sacral wound:  Referral to wound care for further evaluation and  treament    RTC in 6 months for follow-up of chronic conditions  Angeline Laura, NP

## 2024-06-15 NOTE — Assessment & Plan Note (Signed)
 C-Met and lipid profile today Encouraged her to consume a low-fat diet Continue rosuvastatin  40 mg daily

## 2024-06-15 NOTE — Assessment & Plan Note (Signed)
 Encourage high-protein, high-calorie diet Encourage supplements such as boost

## 2024-06-16 ENCOUNTER — Ambulatory Visit: Payer: Self-pay | Admitting: Internal Medicine

## 2024-06-16 LAB — COMPREHENSIVE METABOLIC PANEL WITH GFR
AG Ratio: 1.2 (calc) (ref 1.0–2.5)
ALT: 16 U/L (ref 6–29)
AST: 18 U/L (ref 10–35)
Albumin: 4 g/dL (ref 3.6–5.1)
Alkaline phosphatase (APISO): 80 U/L (ref 37–153)
BUN: 11 mg/dL (ref 7–25)
CO2: 29 mmol/L (ref 20–32)
Calcium: 9.9 mg/dL (ref 8.6–10.4)
Chloride: 97 mmol/L — ABNORMAL LOW (ref 98–110)
Creat: 0.71 mg/dL (ref 0.60–1.00)
Globulin: 3.3 g/dL (ref 1.9–3.7)
Glucose, Bld: 79 mg/dL (ref 65–99)
Potassium: 4.5 mmol/L (ref 3.5–5.3)
Sodium: 135 mmol/L (ref 135–146)
Total Bilirubin: 0.5 mg/dL (ref 0.2–1.2)
Total Protein: 7.3 g/dL (ref 6.1–8.1)
eGFR: 90 mL/min/1.73m2 (ref >=60)

## 2024-06-16 LAB — CBC
HCT: 51.1 % — ABNORMAL HIGH (ref 35.0–45.0)
Hemoglobin: 16.5 g/dL — ABNORMAL HIGH (ref 11.7–15.5)
MCH: 30.2 pg (ref 27.0–33.0)
MCHC: 32.3 g/dL (ref 32.0–36.0)
MCV: 93.6 fL (ref 80.0–100.0)
MPV: 9.9 fL (ref 7.5–12.5)
Platelets: 284 Thousand/uL (ref 140–400)
RBC: 5.46 Million/uL — ABNORMAL HIGH (ref 3.80–5.10)
RDW: 12 % (ref 11.0–15.0)
WBC: 10.9 Thousand/uL — ABNORMAL HIGH (ref 3.8–10.8)

## 2024-06-16 LAB — LIPID PANEL
Cholesterol: 167 mg/dL (ref <200)
HDL: 63 mg/dL (ref >=50)
LDL Cholesterol (Calc): 87 mg/dL
Non-HDL Cholesterol (Calc): 104 mg/dL (ref <130)
Total CHOL/HDL Ratio: 2.7 (calc) (ref <5.0)
Triglycerides: 81 mg/dL (ref <150)

## 2024-06-16 LAB — HEMOGLOBIN A1C
Hgb A1c MFr Bld: 6.1 % — ABNORMAL HIGH (ref <5.7)
Mean Plasma Glucose: 128 mg/dL
eAG (mmol/L): 7.1 mmol/L

## 2024-07-01 ENCOUNTER — Ambulatory Visit

## 2024-07-07 ENCOUNTER — Other Ambulatory Visit: Payer: Self-pay | Admitting: Internal Medicine

## 2024-07-08 NOTE — Telephone Encounter (Signed)
 Requested Prescriptions  Pending Prescriptions Disp Refills   venlafaxine  XR (EFFEXOR -XR) 37.5 MG 24 hr capsule [Pharmacy Med Name: Venlafaxine  HCl ER 37.5 MG Oral Capsule Extended Release 24 Hour] 90 capsule 1    Sig: TAKE 1 CAPSULE BY MOUTH ONCE DAILY WITH BREAKFAST     Psychiatry: Antidepressants - SNRI - desvenlafaxine & venlafaxine  Failed - 07/08/2024  3:20 PM      Failed - Lipid Panel in normal range within the last 12 months    Cholesterol, Total  Date Value Ref Range Status  07/16/2023 173 100 - 199 mg/dL Final   Cholesterol  Date Value Ref Range Status  06/15/2024 167 <200 mg/dL Final   LDL Cholesterol (Calc)  Date Value Ref Range Status  06/15/2024 87 mg/dL (calc) Final    Comment:    Reference range: <100 . Desirable range <100 mg/dL for primary prevention;   <70 mg/dL for patients with CHD or diabetic patients  with > or = 2 CHD risk factors. SABRA LDL-C is now calculated using the Martin-Hopkins  calculation, which is a validated novel method providing  better accuracy than the Friedewald equation in the  estimation of LDL-C.  Gladis APPLETHWAITE et al. SANDREA. 7986;689(80): 2061-2068  (http://education.[facility].com/faq/FAQ164)    HDL  Date Value Ref Range Status  06/15/2024 63 > OR = 50 mg/dL Final  90/81/7975 67 >60 mg/dL Final   Triglycerides  Date Value Ref Range Status  06/15/2024 81 <150 mg/dL Final         Passed - Cr in normal range and within 360 days    Creat  Date Value Ref Range Status  06/15/2024 0.71 0.60 - 1.00 mg/dL Final         Passed - Completed PHQ-2 or PHQ-9 in the last 360 days      Passed - Last BP in normal range    BP Readings from Last 1 Encounters:  06/15/24 110/70         Passed - Valid encounter within last 6 months    Recent Outpatient Visits           3 weeks ago Prediabetes   [place] [facility] [place], Angeline ORN, NP   5 months ago Anxiety and depression   [place] [facility]  [place], [place], NP   7 months ago PAD (peripheral artery disease) [facility])   [facility] [place], Angeline ORN, NP   7 months ago COPD exacerbation [facility])   [place] [facility] [place], Angeline ORN, [place]

## 2024-07-12 ENCOUNTER — Ambulatory Visit: Admitting: Pulmonary Disease

## 2024-07-12 ENCOUNTER — Other Ambulatory Visit: Payer: Self-pay | Admitting: Vascular Surgery

## 2024-07-13 ENCOUNTER — Other Ambulatory Visit: Payer: Self-pay | Admitting: Pulmonary Disease

## 2024-07-22 ENCOUNTER — Encounter: Attending: Physician Assistant | Admitting: Physician Assistant

## 2024-07-22 DIAGNOSIS — L89153 Pressure ulcer of sacral region, stage 3: Secondary | ICD-10-CM | POA: Insufficient documentation

## 2024-07-22 DIAGNOSIS — M6281 Muscle weakness (generalized): Secondary | ICD-10-CM | POA: Insufficient documentation

## 2024-07-22 DIAGNOSIS — J4489 Other specified chronic obstructive pulmonary disease: Secondary | ICD-10-CM | POA: Diagnosis not present

## 2024-07-29 ENCOUNTER — Encounter: Attending: Physician Assistant | Admitting: Physician Assistant

## 2024-07-29 DIAGNOSIS — J4489 Other specified chronic obstructive pulmonary disease: Secondary | ICD-10-CM | POA: Insufficient documentation

## 2024-07-29 DIAGNOSIS — L89153 Pressure ulcer of sacral region, stage 3: Secondary | ICD-10-CM | POA: Insufficient documentation

## 2024-07-29 DIAGNOSIS — M6281 Muscle weakness (generalized): Secondary | ICD-10-CM | POA: Diagnosis not present

## 2024-07-30 ENCOUNTER — Encounter: Payer: Self-pay | Admitting: Pulmonary Disease

## 2024-07-30 ENCOUNTER — Ambulatory Visit: Admitting: Pulmonary Disease

## 2024-07-30 VITALS — BP 122/68 | HR 112 | Temp 97.6°F | Ht 65.0 in | Wt 88.4 lb

## 2024-07-30 DIAGNOSIS — R64 Cachexia: Secondary | ICD-10-CM

## 2024-07-30 DIAGNOSIS — L89159 Pressure ulcer of sacral region, unspecified stage: Secondary | ICD-10-CM

## 2024-07-30 DIAGNOSIS — F17210 Nicotine dependence, cigarettes, uncomplicated: Secondary | ICD-10-CM

## 2024-07-30 DIAGNOSIS — J449 Chronic obstructive pulmonary disease, unspecified: Secondary | ICD-10-CM

## 2024-07-30 NOTE — Patient Instructions (Signed)
 VISIT SUMMARY:  Today, we discussed the management of your severe COPD, your efforts to quit smoking, and the treatment of your pressure sore. You have made progress in reducing your cigarette consumption and your COPD symptoms have improved with the cooler weather. Your pressure sore is also showing signs of improvement with the current wound care regimen.  YOUR PLAN:  -SEVERE CHRONIC OBSTRUCTIVE PULMONARY DISEASE (COPD): COPD is a chronic lung condition that makes it hard to breathe. Your recent exacerbation was likely triggered by environmental factors like heat, humidity, and pollen. You should continue using your Breztri  and Ventolin  inhalers as they are effective. We discussed the possibility of trying Daliresp, but we are concerned about gastrointestinal side effects. If you continue to make progress with quitting smoking, we may consider using Ohtuvayre  via nebulizer in the future.  -TOBACCO USE DISORDER: Tobacco use disorder is a condition where you are dependent on tobacco. You have made significant progress in reducing your cigarette consumption and are motivated to quit gradually. Quitting smoking is crucial for improving your COPD symptoms and the healing of your pressure sore. Keep up the good work and continue to reduce your smoking.  -PRESSURE ULCER OF SACRAL REGION: A pressure ulcer is a sore that develops on areas of the skin that are under constant pressure. Your pressure sore on the tailbone, which developed after back surgery, is slowly improving with collagen treatment. Continuing this wound care and quitting smoking will help promote further healing.  INSTRUCTIONS:  Please continue using your Breztri  and Ventolin  inhalers as prescribed. Consider trying Daliresp if you feel comfortable with it, but be aware of potential gastrointestinal side effects. If you continue to make progress with quitting smoking, we may consider using Ohtuvayre  via nebulizer in the future. Keep up the  good work with reducing your smoking, as it will help improve your COPD symptoms and the healing of your pressure sore. Continue with your current wound care regimen for the pressure sore. Follow up with us  as needed for any changes or concerns.

## 2024-07-30 NOTE — Progress Notes (Signed)
 Subjective:    Patient ID: Pamela Haley, female    DOB: [birth year], 73 y.o.   MRN: [id number]  Patient Care Team: Antonette Angeline ORN, NP as PCP - General (Internal Medicine) Darron Deatrice LABOR, MD as PCP - Cardiology (Cardiology) Tamea Dedra CROME, MD as Consulting Physician (Pulmonary Disease) [facility], [facility])  Chief Complaint  Patient presents with   COPD    Cough and shortness of breath with exertion.     BACKGROUND/INTERVAL:73 year old female, current every day smoker. PMH significant for stage 3 COPD, HTN, HLD, prediabetes, tobacco abuse. Last seen on 09 Mar 2024 by me.    HPI Discussed the use of AI scribe software for clinical note transcription with the patient, who gave verbal consent to proceed.  History of Present Illness   Pamela Haley is a 73 year old female with severe COPD who presents for management of her condition.  She has severe COPD and is currently using a Breztri  nhaler, which is effective. She is attempting to quit smoking and has significantly reduced her cigarette consumption. About a month ago, she experienced an exacerbation lasting three days, characterized by difficulty breathing and frequent use of her rescue inhaler. This episode coincided with high heat, humidity, and pollen levels. Her symptoms have improved since the weather has cooled.  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 The sore is described as agonizing and impacts her ability to sleep on her back, forcing her to sleep on her side. She is receiving wound care, through the wound clinic, with collagen treatment, which is slowly improving the condition. She notes recent improvement as she was able to sleep on her back.  She is currently working on gaining weight, as she is down to 88 pounds, which she considers 'pretty good' for her. She has been eating cheesecake to help with weight gain.  She does not endorse any other  symptomatology today.  She inquired about other treatments for COPD, she was advised that quitting smoking is imperative.  We discussed medications such as Daliresp which is usually poorly tolerated due to GI side effects and she does not want to risks these so wants to hold off for now.  If she quit smoking, we will consider Ohtuvayre .  She was referred to lung cancer screening program.  She received a call from them but has yet to call them back.  She was encouraged to follow-up with this.  She states she is up-to-date on flu vaccine.  Review of Systems A 10 point review of systems was performed and it is as noted above otherwise negative.   Patient Active Problem List   Diagnosis Date Noted   Aortic atherosclerosis 06/15/2024   Polycythemia 06/15/2024   Anxiety and depression 01/19/2024   Underweight (BMI < 18.5) 12/10/2023   PAD (peripheral artery disease) 12/03/2023   Prediabetes 07/20/2021   HLD (hyperlipidemia) 07/20/2021   Protein calorie malnutrition 04/10/2021   COPD (chronic obstructive pulmonary disease) (HCC) 01/25/2016   DDD (degenerative disc disease), lumbar 02/20/2015   Hyperthyroidism 02/20/2015    Social History   Tobacco Use   Smoking status: Every Day    Current packs/day: 0.50    Average packs/day: 0.3 packs/day for 32.4 years (8.7 ttl pk-yrs)    Types: Cigarettes    Start date: 03/13/1992    Last attempt to quit: 03/13/2022   Smokeless tobacco: Never   Tobacco comments:    10  cigarettes daily. Khj 03/09/2024  Substance Use Topics   Alcohol use: Yes    Alcohol/week: 0.0 standard drinks of alcohol    Comment: rare    No Known Allergies  Current Meds  Medication Sig   acetaminophen  (TYLENOL ) 325 MG tablet Take 650 mg by mouth every 6 (six) hours as needed.   albuterol  (VENTOLIN  HFA) 108 (90 Base) MCG/ACT inhaler INHALE 2 PUFFS BY MOUTH EVERY 6 HOURS AS NEEDED FOR WHEEZING AND FOR SHORTNESS OF BREATH   aspirin  EC 81 MG tablet Take 1 tablet (81 mg total)  by mouth daily. Swallow whole.   budeson-glycopyrrolate -formoterol  (BREZTRI  AEROSPHERE) 160-9-4.8 MCG/ACT AERO Inhale 2 puffs into the lungs in the morning and at bedtime.   cilostazol  (PLETAL ) 100 MG tablet TAKE 1 TABLET BY MOUTH TWICE DAILY BEFORE A MEAL   cyclobenzaprine  (FLEXERIL ) 10 MG tablet Take 10 mg by mouth 2 (two) times daily as needed for muscle spasms.   dextromethorphan-guaiFENesin (MUCINEX DM) 30-600 MG 12hr tablet Take 1 tablet by mouth daily.   potassium chloride  (KLOR-CON ) 10 MEQ tablet Take 1 tablet by mouth once daily   rosuvastatin  (CRESTOR ) 40 MG tablet Take 1 tablet (40 mg total) by mouth daily.   venlafaxine  XR (EFFEXOR -XR) 37.5 MG 24 hr capsule TAKE 1 CAPSULE BY MOUTH ONCE DAILY WITH BREAKFAST    Immunization History  Administered Date(s) Administered   Fluad Quad(high Dose 65+) 07/26/2019, 08/02/2022   Fluad Trivalent(High Dose 65+) 07/04/2023   INFLUENZA, HIGH DOSE SEASONAL PF 08/25/2018, 07/10/2021   Influenza,inj,Quad PF,6+ Mos 09/11/2016   Influenza-Unspecified 07/17/2023   Moderna Covid-19 Vaccine  Bivalent Booster 37yrs & up 07/10/2021   Moderna SARS-COV2 Booster Vaccination 08/21/2020, 08/02/2022   Moderna Sars-Covid-2 Vaccination 12/02/2019, 12/29/2019, 08/11/2020   PNEUMOCOCCAL CONJUGATE-20 06/15/2024   Pneumococcal Conjugate-13 08/25/2018   Unspecified SARS-COV-2 Vaccination 07/17/2023        Objective:     BP 122/68   Pulse (!) 112   Temp 97.6 F (36.4 C) (Temporal)   Ht 5' 5 (1.651 m)   Wt 88 lb 6.4 oz (40.1 kg)   SpO2 93%   BMI 14.71 kg/m   SpO2: 93 %  GENERAL: Thin/cachectic, well-developed woman, no acute distress. HEAD: Normocephalic, atraumatic.  EYES: Pupils equal, round, reactive to light.  No scleral icterus.  Conjunctiva mildly injected, MOUTH: Oral mucosa moist.  No thrush. NECK: Supple. No thyromegaly. Trachea midline. No JVD.  No adenopathy. PULMONARY: Good air entry bilaterally.  Coarse otherwise, no adventitious  sounds. CARDIOVASCULAR: S1 and S2.  Slightly tachycardic.  No rubs, murmurs or gallops heard. ABDOMEN: Benign. MUSCULOSKELETAL: No joint deformity, no clubbing, no edema.  NEUROLOGIC: Neuro normal. SKIN: Intact,warm,dry. PSYCH: Mood and behavior normal.    Assessment & Plan:     ICD-10-CM   1. Stage 3 severe COPD by GOLD classification (HCC)  J44.9     2. 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 Pressure injury of skin of sacral region, unspecified injury stage  L89.159     4. Tobacco dependence due to cigarettes  F17.210      Discussion:    Severe chronic obstructive pulmonary disease (COPD) Severe COPD with recent exacerbation likely triggered by environmental factors. Symptoms improved with rescue inhaler and cooler weather. Current management with Breztri  and Ventolin  is effective. Discussed Daliresp, but she is concerned about gastrointestinal side effects and is working on weight gain. Consideration of nebulized Ohtuvayre  if smoking cessation progresses. - Continue Breztri  and  Ventolin . - Consider Daliresp if she is willing to trial, with caution regarding potential gastrointestinal side effects. - Consider Ohtuvayre  via nebulizer if smoking cessation progresses. - Encourage smoking cessation to improve COPD management and overall health.  Tobacco use disorder Ongoing tobacco use with recent reduction. She is motivated to quit gradually. Smoking cessation is crucial for improving COPD symptoms and pressure ulcer healing. - Encourage gradual smoking cessation.  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 Currently under treatment with wound care and collagen, showing slow improvement. Smoking cessation emphasized to enhance healing. - Continue wound care management with collagen. - Encourage smoking cessation to promote healing of the pressure ulcer.      Advised  if symptoms do not improve or worsen, to please contact office for sooner follow up or seek emergency care.    I spent 35 minutes of dedicated to the care of this patient on the date of this encounter to include pre-visit review of records, face-to-face time with the patient discussing conditions above, post visit ordering of testing, clinical documentation with the electronic health record, making appropriate referrals as documented, and communicating necessary findings to members of the patients care team.     C. Leita Sanders, MD Advanced [facility]    *This note was generated using voice recognition software/Dragon and/or AI transcription program.  Despite best efforts to proofread, errors can occur which can change the meaning. Any transcriptional errors that result from this process are unintentional and may not be fully corrected at the time of dictation.

## 2024-08-04 ENCOUNTER — Encounter: Admitting: Physician Assistant

## 2024-08-04 DIAGNOSIS — L89153 Pressure ulcer of sacral region, stage 3: Secondary | ICD-10-CM | POA: Diagnosis not present

## 2024-08-10 ENCOUNTER — Other Ambulatory Visit: Payer: Self-pay | Admitting: Vascular Surgery

## 2024-08-26 ENCOUNTER — Ambulatory Visit

## 2024-08-27 ENCOUNTER — Encounter: Admitting: Physician Assistant

## 2024-08-27 DIAGNOSIS — L89153 Pressure ulcer of sacral region, stage 3: Secondary | ICD-10-CM | POA: Diagnosis not present

## 2024-08-31 ENCOUNTER — Other Ambulatory Visit: Payer: Self-pay | Admitting: Physician Assistant

## 2024-08-31 DIAGNOSIS — S31000A Unspecified open wound of lower back and pelvis without penetration into retroperitoneum, initial encounter: Secondary | ICD-10-CM

## 2024-09-09 ENCOUNTER — Encounter: Attending: Physician Assistant | Admitting: Physician Assistant

## 2024-09-09 DIAGNOSIS — J449 Chronic obstructive pulmonary disease, unspecified: Secondary | ICD-10-CM | POA: Diagnosis not present

## 2024-09-09 DIAGNOSIS — L89153 Pressure ulcer of sacral region, stage 3: Secondary | ICD-10-CM | POA: Diagnosis present

## 2024-09-09 DIAGNOSIS — M6281 Muscle weakness (generalized): Secondary | ICD-10-CM | POA: Insufficient documentation

## 2024-09-16 ENCOUNTER — Encounter: Admitting: Physician Assistant

## 2024-09-16 DIAGNOSIS — L89153 Pressure ulcer of sacral region, stage 3: Secondary | ICD-10-CM | POA: Diagnosis not present

## 2024-09-28 ENCOUNTER — Encounter: Admitting: Physician Assistant

## 2024-09-28 DIAGNOSIS — L89153 Pressure ulcer of sacral region, stage 3: Secondary | ICD-10-CM | POA: Insufficient documentation

## 2024-09-28 DIAGNOSIS — J4489 Other specified chronic obstructive pulmonary disease: Secondary | ICD-10-CM | POA: Insufficient documentation

## 2024-09-28 DIAGNOSIS — M6281 Muscle weakness (generalized): Secondary | ICD-10-CM | POA: Insufficient documentation

## 2024-10-18 ENCOUNTER — Other Ambulatory Visit: Payer: Self-pay

## 2024-10-18 MED ORDER — BREZTRI AEROSPHERE 160-9-4.8 MCG/ACT IN AERO: 2.0000 | INHALATION_SPRAY | Freq: Two times a day (BID) | RESPIRATORY_TRACT | 3 refills | Status: AC

## 2024-10-18 NOTE — Telephone Encounter (Signed)
 [facility] requested new prescription for Breztri . Prescription sent for 1 year supply. NFN.

## 2024-10-19 ENCOUNTER — Encounter: Admitting: Physician Assistant

## 2024-10-29 ENCOUNTER — Encounter: Payer: Self-pay | Admitting: Physician Assistant

## 2024-10-29 ENCOUNTER — Ambulatory Visit: Attending: Vascular Surgery | Admitting: Physician Assistant

## 2024-10-29 VITALS — BP 133/73 | HR 117 | Temp 97.7°F

## 2024-10-29 DIAGNOSIS — I739 Peripheral vascular disease, unspecified: Secondary | ICD-10-CM | POA: Diagnosis not present

## 2024-10-29 DIAGNOSIS — I7409 Other arterial embolism and thrombosis of abdominal aorta: Secondary | ICD-10-CM | POA: Insufficient documentation

## 2024-10-29 NOTE — Progress Notes (Signed)
 " HISTORY AND PHYSICAL     CC:  follow up. Requesting Provider:  Antonette Angeline ORN, NP  HPI: This is a 74 y.o. female who is here today for follow up for PAD.  Pt has hx of occluded distal aorta, right-sided iliac system with symptoms of moderate to severe claudication.   Pt was last seen 08/21/2023 and at that time, she was having some claudication sx but did not feel they were lifestyle limiting.  She was on Pletal  and a dedicated walking program was recommended as well as smoking cessation. Dr. Lanis discussed with her should her symptoms progress, she would require an aortobifemoral bypass.   The pt returns today for follow up.  She states she is doing well.  She denies any claudication, rest pain or non healing wounds.  She is down to about 3 cigarettes per day and continues trying to quit.  She states that Dr. Darron has her on statin, asa, cilastazol.    She states that her walking is more inhibited by her back than her legs.  She states when she goes to a store, if she pushes the cart, she can walk farther than without it.    She states she has hx of COPD.  The pt is on a statin for cholesterol management.    The pt is on an aspirin .    Other AC:  none The pt is not on medication for hypertension.  The pt is not on diabetic medication. Tobacco hx:  current  Pt does not have family hx of AAA.  Past Medical History:  Diagnosis Date   Abnormal EKG    HX OF PREVIOUS EKGS SHOWING POSSIBLE MI--BUT PT HAS NEVER HAD ANY HEART PROBLEMS--HAD BACK SURGERY AUG 2012 AT [facility] - NO HEART PROBLEMS   Atherosclerosis of native artery of both lower extremities with intermittent claudication    Chronic cough    PT IS A SMOKER   COPD (chronic obstructive pulmonary disease) (HCC)    DDD (degenerative disc disease), lumbar    PAIN IN LOWER BACK AND DOWN RT HIP AND LEG--NUMBNESS, THROBBING, BURNING   Grade II diastolic dysfunction    Hyperthyroidism 02/20/2015   Occluded aorta    PAD (peripheral  artery disease)    PAD (peripheral artery disease)     Past Surgical History:  Procedure Laterality Date   ABDOMINAL AORTOGRAM W/LOWER EXTREMITY N/A 02/12/2023   Procedure: ABDOMINAL AORTOGRAM W/LOWER EXTREMITY;  Surgeon: Darron Deatrice LABOR, MD;  Location: [facility];  Service: Cardiovascular;  Laterality: N/A;   BACK SURGERY  05/2011   lumbar surgery at [facility]   BILATERAL CARPAL TUNNEL RELEASE     C SECTIONS X 3     CATARACT EXTRACTION W/PHACO Left 10/20/2023   Procedure: CATARACT EXTRACTION PHACO AND INTRAOCULAR LENS PLACEMENT (IOC) LEFT 8.35 00:44.3;  Surgeon: Jaye Fallow, MD;  Location: [facility];  Service: Ophthalmology;  Laterality: Left;   CATARACT EXTRACTION W/PHACO Right 11/04/2023   Procedure: CATARACT EXTRACTION PHACO AND INTRAOCULAR LENS PLACEMENT (IOC) RIGHT 7.52 00:50.5;  Surgeon: Jaye Fallow, MD;  Location: [facility];  Service: Ophthalmology;  Laterality: Right;   COLONOSCOPY N/A 03/04/2024   Procedure: COLONOSCOPY;  Surgeon: Unk Corinn Skiff, MD;  Location: [facility] END[facility];  Service: Gastroenterology;  Laterality: N/A;   POLYPECTOMY  03/04/2024   Procedure: POLYPECTOMY, INTESTINE;  Surgeon: Unk Corinn Skiff, MD;  Location: [facility] ENDOSCOPY;  Service: Gastroenterology;;    Allergies[1]  Current Outpatient Medications  Medication Sig Dispense Refill  acetaminophen  (TYLENOL ) 325 MG tablet Take 650 mg by mouth every 6 (six) hours as needed.     albuterol  (VENTOLIN  HFA) 108 (90 Base) MCG/ACT inhaler INHALE 2 PUFFS BY MOUTH EVERY 6 HOURS AS NEEDED FOR WHEEZING AND FOR SHORTNESS OF BREATH 18 g 11   aspirin  EC 81 MG tablet Take 1 tablet (81 mg total) by mouth daily. Swallow whole. 90 tablet 3   budesonide-glycopyrrolate -formoterol  (BREZTRI  AEROSPHERE) 160-9-4.8 MCG/ACT AERO inhaler Inhale 2 puffs into the lungs in the morning and at bedtime. 3 each 3   cilostazol  (PLETAL ) 100 MG tablet TAKE 1 TABLET BY MOUTH TWICE DAILY BEFORE A MEAL 180 tablet 0    cyclobenzaprine  (FLEXERIL ) 10 MG tablet Take 10 mg by mouth 2 (two) times daily as needed for muscle spasms.     dextromethorphan-guaiFENesin (MUCINEX DM) 30-600 MG 12hr tablet Take 1 tablet by mouth daily.     potassium chloride  (KLOR-CON ) 10 MEQ tablet Take 1 tablet by mouth once daily 90 tablet 3   rosuvastatin  (CRESTOR ) 40 MG tablet Take 1 tablet (40 mg total) by mouth daily. 90 tablet 3   venlafaxine  XR (EFFEXOR -XR) 37.5 MG 24 hr capsule TAKE 1 CAPSULE BY MOUTH ONCE DAILY WITH BREAKFAST 90 capsule 1   No current facility-administered medications for this visit.    Family History  Problem Relation Age of Onset   Heart disease Mother    Heart attack Father    Heart disease Father    Stroke Maternal Grandmother    Diabetes Maternal Grandmother     Social History   Socioeconomic History   Marital status: Single    Spouse name: Not on file   Number of children: Not on file   Years of education: Not on file   Highest education level: Associate degree: occupational, scientist, product/process development, or vocational program  Occupational History   Not on file  Tobacco Use   Smoking status: Every Day    Current packs/day: 0.50    Average packs/day: 0.3 packs/day for 32.6 years (8.8 ttl pk-yrs)    Types: Cigarettes    Start date: 03/13/1992    Last attempt to quit: 03/13/2022   Smokeless tobacco: Never   Tobacco comments:    10 cigarettes daily. Khj 03/09/2024  Vaping Use   Vaping status: Never Used  Substance and Sexual Activity   Alcohol use: Yes    Alcohol/week: 0.0 standard drinks of alcohol    Comment: rare   Drug use: No   Sexual activity: Not Currently  Other Topics Concern   Not on file  Social History Narrative   Not on file   Social Drivers of Health   Tobacco Use: High Risk (07/30/2024)   Patient History    Smoking Tobacco Use: Every Day    Smokeless Tobacco Use: Never    Passive Exposure: Not on file  Financial Resource Strain: Low Risk (01/02/2024)   Overall Financial Resource  Strain (CARDIA)    Difficulty of Paying Living Expenses: Not hard at all  Food Insecurity: No Food Insecurity (01/02/2024)   Hunger Vital Sign    Worried About Running Out of Food in the Last Year: Never true    Ran Out of Food in the Last Year: Never true  Recent Concern: Food Insecurity - Food Insecurity Present (12/05/2023)   Hunger Vital Sign    Worried About Running Out of Food in the Last Year: Sometimes true    Ran Out of Food in the Last Year: Never true  Transportation Needs:  No Transportation Needs (01/02/2024)   PRAPARE - Administrator, Civil Service (Medical): No    Lack of Transportation (Non-Medical): No  Physical Activity: Insufficiently Active (01/02/2024)   Exercise Vital Sign    Days of Exercise per Week: 2 days    Minutes of Exercise per Session: 20 min  Stress: No Stress Concern Present (01/02/2024)   [employer] of Occupational Health - Occupational Stress Questionnaire    Feeling of Stress : Only a little  Social Connections: Socially Isolated (01/02/2024)   Social Connection and Isolation Panel    Frequency of Communication with Friends and Family: More than three times a week    Frequency of Social Gatherings with Friends and Family: More than three times a week    Attends Religious Services: Never    Database Administrator or Organizations: No    Attends Banker Meetings: Never    Marital Status: Divorced  Catering Manager Violence: Not At Risk (01/02/2024)   Humiliation, Afraid, Rape, and Kick questionnaire    Fear of Current or Ex-Partner: No    Emotionally Abused: No    Physically Abused: No    Sexually Abused: No  Depression (PHQ2-9): Medium Risk (06/15/2024)   Depression (PHQ2-9)    PHQ-2 Score: 6  Alcohol Screen: Low Risk (01/02/2024)   Alcohol Screen    Last Alcohol Screening Score (AUDIT): 0  Housing: Unknown (01/02/2024)   Housing Stability Vital Sign    Unable to Pay for Housing in the Last Year: No    Number of Times Moved in  the Last Year: Not on file    Homeless in the Last Year: No  Utilities: Not At Risk (01/02/2024)   AHC Utilities    Threatened with loss of utilities: No  Health Literacy: Adequate Health Literacy (01/02/2024)   B1300 Health Literacy    Frequency of need for help with medical instructions: Never     REVIEW OF SYSTEMS:   [X]  denotes positive finding, [ ]  denotes negative finding Cardiac  Comments:  Chest pain or chest pressure:    Shortness of breath upon exertion:    Short of breath when lying flat:    Irregular heart rhythm:        Vascular    Pain in calf, thigh, or hip brought on by ambulation:    Pain in feet at night that wakes you up from your sleep:     Blood clot in your veins:    Leg swelling:         Pulmonary    Oxygen  at home:    Productive cough:     Wheezing:         Neurologic    Sudden weakness in arms or legs:     Sudden numbness in arms or legs:     Sudden onset of difficulty speaking or slurred speech:    Temporary loss of vision in one eye:     Problems with dizziness:         Gastrointestinal    Blood in stool:     Vomited blood:         Genitourinary    Burning when urinating:     Blood in urine:        Psychiatric    Major depression:         Hematologic    Bleeding problems:    Problems with blood clotting too easily:        Skin  Rashes or ulcers:        Constitutional    Fever or chills:      PHYSICAL EXAMINATION:  Today's Vitals   10/29/24 1254  BP: 133/73  Pulse: (!) 117  Temp: 97.7 F (36.5 C)  TempSrc: Temporal  SpO2: 90%   There is no height or weight on file to calculate BMI.   General:  WDWN in NAD; vital signs documented above Gait: Not observed HENT: WNL, normocephalic Pulmonary: normal non-labored breathing , without wheezing Cardiac: regular HR, without carotid bruits Abdomen: soft, NT; aortic pulse is not palpable Skin: without rashes Vascular Exam/Pulses:  Right Left  Radial 2+ (normal) 2+ (normal)   Femoral 2+ (normal) Faintly palpable  Popliteal Unable to palpate Unable to palpate  DP Unable to palpate Unable to palpate  PT Unable to palpate Unable to palpate   Extremities: without ischemic changes, without Gangrene , without cellulitis; without open wounds Musculoskeletal: no muscle wasting or atrophy  Neurologic: A&O X 3 Psychiatric:  The pt has Normal affect.   Non-Invasive Vascular Imaging:   none    ASSESSMENT/PLAN:: 74 y.o. female here for follow up for PAD with hx of occluded distal aorta, right-sided iliac system with symptoms of moderate to severe claudication.   PAD -pt doing well from vascular standpoint without claudication, rest pain or non healing wounds.   -continue asa/statin/Pletal  -discussed importance of increased walking daily.  This may be difficult for her as she is more inhibited by her back.  -pt will f/u in one year with Dr. Lanis.  She does not need any studies.  She will call sooner if she has any issues before then such as rest pain or non healing wounds.  Current smoker -discussed with pt to continue working on her smoking.  Congratulated and praised her for cutting down to 3 cigarettes per day.  Discussed with pt that it puts them at increased risk for limb loss, heart attack, stroke, cancers, lung problems.      Lucie Apt, [facility] [phone]  Clinic MD:   Sheree on call MD     [1] No Known Allergies  "

## 2024-11-02 ENCOUNTER — Encounter: Attending: Physician Assistant | Admitting: Physician Assistant

## 2024-11-02 DIAGNOSIS — J4489 Other specified chronic obstructive pulmonary disease: Secondary | ICD-10-CM | POA: Diagnosis not present

## 2024-11-02 DIAGNOSIS — M6281 Muscle weakness (generalized): Secondary | ICD-10-CM | POA: Insufficient documentation

## 2024-11-02 DIAGNOSIS — L89153 Pressure ulcer of sacral region, stage 3: Secondary | ICD-10-CM | POA: Insufficient documentation

## 2024-11-10 ENCOUNTER — Encounter: Admitting: Physician Assistant

## 2024-11-10 DIAGNOSIS — L89153 Pressure ulcer of sacral region, stage 3: Secondary | ICD-10-CM | POA: Diagnosis not present

## 2024-11-18 ENCOUNTER — Encounter: Admitting: Physician Assistant

## 2024-11-18 DIAGNOSIS — L89153 Pressure ulcer of sacral region, stage 3: Secondary | ICD-10-CM | POA: Diagnosis not present

## 2024-11-19 ENCOUNTER — Other Ambulatory Visit: Payer: Self-pay | Admitting: Vascular Surgery

## 2024-11-25 ENCOUNTER — Encounter: Admitting: Physician Assistant

## 2024-11-25 DIAGNOSIS — L89153 Pressure ulcer of sacral region, stage 3: Secondary | ICD-10-CM | POA: Diagnosis not present

## 2024-11-26 ENCOUNTER — Encounter: Admitting: Physician Assistant

## 2024-12-02 ENCOUNTER — Encounter: Admitting: Physician Assistant

## 2024-12-08 ENCOUNTER — Encounter: Admitting: Physician Assistant

## 2024-12-13 ENCOUNTER — Ambulatory Visit: Admitting: Internal Medicine

## 2024-12-23 ENCOUNTER — Ambulatory Visit: Admitting: Pulmonary Disease

## 2025-01-07 ENCOUNTER — Ambulatory Visit

## 2025-01-12 ENCOUNTER — Ambulatory Visit
